# Patient Record
Sex: Male | Born: 1965 | Race: White | Hispanic: No | Marital: Married | Smoking: Former smoker
Health system: Southern US, Community
[De-identification: ages and names within clinical notes are randomized; demographics above are authoritative.]

## PROBLEM LIST (undated history)

## (undated) DIAGNOSIS — K219 Gastro-esophageal reflux disease without esophagitis: Secondary | ICD-10-CM

## (undated) DIAGNOSIS — G8929 Other chronic pain: Secondary | ICD-10-CM

## (undated) DIAGNOSIS — B9681 Helicobacter pylori [H. pylori] as the cause of diseases classified elsewhere: Secondary | ICD-10-CM

## (undated) DIAGNOSIS — M1990 Unspecified osteoarthritis, unspecified site: Secondary | ICD-10-CM

## (undated) DIAGNOSIS — K279 Peptic ulcer, site unspecified, unspecified as acute or chronic, without hemorrhage or perforation: Secondary | ICD-10-CM

## (undated) DIAGNOSIS — K259 Gastric ulcer, unspecified as acute or chronic, without hemorrhage or perforation: Secondary | ICD-10-CM

## (undated) DIAGNOSIS — M549 Dorsalgia, unspecified: Secondary | ICD-10-CM

## (undated) HISTORY — DX: Gastric ulcer, unspecified as acute or chronic, without hemorrhage or perforation: K25.9

## (undated) HISTORY — PX: HERNIA REPAIR: SHX51

## (undated) HISTORY — DX: Peptic ulcer, site unspecified, unspecified as acute or chronic, without hemorrhage or perforation: K27.9

## (undated) HISTORY — DX: Other chronic pain: G89.29

## (undated) HISTORY — DX: Gastro-esophageal reflux disease without esophagitis: K21.9

## (undated) HISTORY — DX: Dorsalgia, unspecified: M54.9

## (undated) HISTORY — DX: Helicobacter pylori (H. pylori) as the cause of diseases classified elsewhere: B96.81

---

## 2002-08-25 ENCOUNTER — Encounter: Payer: Self-pay | Admitting: Emergency Medicine

## 2002-08-25 ENCOUNTER — Emergency Department (HOSPITAL_COMMUNITY): Admission: EM | Admit: 2002-08-25 | Discharge: 2002-08-25 | Payer: Self-pay | Admitting: Emergency Medicine

## 2002-11-27 ENCOUNTER — Encounter: Admission: RE | Admit: 2002-11-27 | Discharge: 2002-11-27 | Payer: Self-pay | Admitting: Occupational Medicine

## 2002-11-27 ENCOUNTER — Encounter: Payer: Self-pay | Admitting: Occupational Medicine

## 2011-05-25 ENCOUNTER — Encounter: Payer: Self-pay | Admitting: Family Medicine

## 2011-05-25 ENCOUNTER — Ambulatory Visit (INDEPENDENT_AMBULATORY_CARE_PROVIDER_SITE_OTHER): Payer: BC Managed Care – PPO | Admitting: Family Medicine

## 2011-05-25 VITALS — BP 114/72 | HR 69 | Ht 66.5 in | Wt 161.0 lb

## 2011-05-25 DIAGNOSIS — K625 Hemorrhage of anus and rectum: Secondary | ICD-10-CM

## 2011-05-25 DIAGNOSIS — K529 Noninfective gastroenteritis and colitis, unspecified: Secondary | ICD-10-CM

## 2011-05-25 DIAGNOSIS — L98499 Non-pressure chronic ulcer of skin of other sites with unspecified severity: Secondary | ICD-10-CM

## 2011-05-25 DIAGNOSIS — M62830 Muscle spasm of back: Secondary | ICD-10-CM

## 2011-05-25 DIAGNOSIS — G8929 Other chronic pain: Secondary | ICD-10-CM

## 2011-05-25 DIAGNOSIS — M5380 Other specified dorsopathies, site unspecified: Secondary | ICD-10-CM

## 2011-05-25 DIAGNOSIS — IMO0002 Reserved for concepts with insufficient information to code with codable children: Secondary | ICD-10-CM

## 2011-05-25 DIAGNOSIS — K5289 Other specified noninfective gastroenteritis and colitis: Secondary | ICD-10-CM

## 2011-05-25 DIAGNOSIS — M25519 Pain in unspecified shoulder: Secondary | ICD-10-CM

## 2011-05-25 MED ORDER — AMOXICILL-CLARITHRO-LANSOPRAZ PO MISC: Freq: Two times a day (BID) | ORAL | Status: AC

## 2011-05-25 MED ORDER — ORPHENADRINE CITRATE ER 100 MG PO TB12: 100.0000 mg | ORAL_TABLET | Freq: Two times a day (BID) | ORAL | Status: DC

## 2011-05-25 NOTE — Progress Notes (Signed)
  Subjective:    Patient ID: Danny Cohen. [place], male    DOB: 1966/07/07, 45 y.o.   MRN: [id number]  HPI Patient is a 45 yr old WM w/abdominal pain. He states it has been going on for several weeks and he went to the ED 2 weeks ago. Was placed on prilosec which did not help much and last night after eating experienced such sharp pain he got OTC prilosec. He does state that due to some recent blow back pain that he experienced while working on a ladder and chronic R shoulder pain from an accident he has been using a lot of motrin. He does not smoke or drink ETOH significantly. He now has started seeing blood in his stools.   Review of Systems  HENT:       R shoulder and lower bilateral back pain  Gastrointestinal: Positive for abdominal distention.  Psychiatric/Behavioral: Negative.        Objective:   Physical Exam  Constitutional: He is oriented to person, place, and time. He appears well-developed and well-nourished.  HENT:  Head: Normocephalic.  Neck: Normal range of motion. Neck supple.  Cardiovascular: Normal rate, regular rhythm and normal heart sounds.   Pulmonary/Chest: Effort normal and breath sounds normal.  Abdominal: Soft. Bowel sounds are normal. He exhibits no mass. There is tenderness. There is no rebound and no guarding.       Tenderness is diffuse but most of the tenderness is mid upper abdomen.  Musculoskeletal:       Diffuse mid lower back pain consistent w/muscle spasm  Neurological: He is alert and oriented to person, place, and time.  Skin: Skin is warm and dry.  Psychiatric: He has a normal mood and affect. His behavior is normal.          Assessment & Plan:  Will treat w/prevpack or helidac for possible ulcer Will get h. pylori,cbc and cmp, and lipase Avoid motrin or alleve Norflex 100mg  twice a day for back pain if sedation occurs just take at night celebrex samples if available but do not take for 1-2 weeks  Return in 6 weeks for follow up and rectal  exam

## 2011-05-25 NOTE — Progress Notes (Signed)
Addended by: Hassan Rowan on: 05/25/2011 04:22 PM   Modules accepted: Orders

## 2011-05-25 NOTE — Patient Instructions (Signed)
Will treat w/prevpack or helidac for possible ulcer Will get h. pylori,cbc and cmp, and lipase Avoid motrin or alleve Norflex 100mg  twice a day for back pain if sedation occurs just take at night celebrex samples if available but do not take for 1-2 weeks

## 2011-05-26 LAB — CBC WITH DIFFERENTIAL/PLATELET
Basophils Absolute: 0.1 10*3/uL (ref 0.0–0.1)
Basophils Relative: 1 % (ref 0–1)
Eosinophils Absolute: 0.2 10*3/uL (ref 0.0–0.7)
Eosinophils Relative: 5 % (ref 0–5)
HCT: 42.1 % (ref 39.0–52.0)
Hemoglobin: 14.2 g/dL (ref 13.0–17.0)
Lymphocytes Relative: 28 % (ref 12–46)
Lymphs Abs: 1.4 10*3/uL (ref 0.7–4.0)
MCH: 30.1 pg (ref 26.0–34.0)
MCHC: 33.7 g/dL (ref 30.0–36.0)
MCV: 89.4 fL (ref 78.0–100.0)
Monocytes Absolute: 0.3 10*3/uL (ref 0.1–1.0)
Monocytes Relative: 7 % (ref 3–12)
Neutro Abs: 2.9 10*3/uL (ref 1.7–7.7)
Neutrophils Relative %: 59 % (ref 43–77)
Platelets: 278 10*3/uL (ref 150–400)
RBC: 4.71 MIL/uL (ref 4.22–5.81)
RDW: 13.6 % (ref 11.5–15.5)
WBC: 5 10*3/uL (ref 4.0–10.5)

## 2011-05-26 LAB — COMPLETE METABOLIC PANEL WITH GFR
ALT: 17 U/L (ref 0–53)
AST: 18 U/L (ref 0–37)
Albumin: 4.9 g/dL (ref 3.5–5.2)
Alkaline Phosphatase: 42 U/L (ref 39–117)
BUN: 12 mg/dL (ref 6–23)
CO2: 26 mEq/L (ref 19–32)
Calcium: 9.9 mg/dL (ref 8.4–10.5)
Chloride: 104 mEq/L (ref 96–112)
Creat: 1.08 mg/dL (ref 0.50–1.35)
GFR, Est African American: >60 mL/min (ref >60)
GFR, Est Non African American: >60 mL/min (ref >60)
Glucose, Bld: 88 mg/dL (ref 70–99)
Potassium: 5.1 mEq/L (ref 3.5–5.3)
Sodium: 142 mEq/L (ref 135–145)
Total Bilirubin: 0.5 mg/dL (ref 0.3–1.2)
Total Protein: 7.4 g/dL (ref 6.0–8.3)

## 2011-05-26 LAB — H. PYLORI ANTIBODY, IGG: H Pylori IgG: 6.06 {ISR} — ABNORMAL HIGH

## 2011-05-26 LAB — LIPASE: Lipase: 56 U/L (ref 0–75)

## 2011-06-05 ENCOUNTER — Telehealth: Payer: Self-pay | Admitting: Family Medicine

## 2011-06-05 ENCOUNTER — Other Ambulatory Visit: Payer: Self-pay | Admitting: Family Medicine

## 2011-06-05 NOTE — Telephone Encounter (Signed)
Message copied by Gifford Shave on Mon Jun 05, 2011  2:24 PM ------      Message from: Nani Gasser D      Created: Mon Jun 05, 2011 12:30 PM       He is + for h. Pylori which is a stomach bug that can make you more prone to ulcers. The prevpac that was sent to his pharm should clear this up. Pancreas is nl. No sign of systemic infection. CMP is nl.

## 2011-06-05 NOTE — Telephone Encounter (Signed)
I just reviewed adn sent a resulst note.

## 2011-06-05 NOTE — Telephone Encounter (Signed)
Pt calling for his recent lab results, and I do not see where provider has reviewed.  Don't see in anyone's result folder.  Please advise since abnormal. Plan:  Routed to Dr. Marlyne Beards, LPN [facility]

## 2011-06-05 NOTE — Telephone Encounter (Signed)
Pt notified about lab results.  Had to [facility] with details and told to pup Prevpac. Jarvis Newcomer, LPN [facility]'

## 2011-06-19 ENCOUNTER — Telehealth: Payer: Self-pay | Admitting: Family Medicine

## 2011-06-19 NOTE — Telephone Encounter (Signed)
Pt called and said he has one dose of his prevpac left and is still C/O bloating, diarrhea twice daily that is watery and some form, and with discomfort still.  Pt is straining to have a BM.  Pt has a 6 week fup appt for 07-06-11, but do we need to see sooner.  Please advise. Plan:  Routed to Dr. Estill Bakes, LPN [facility]

## 2011-06-20 ENCOUNTER — Encounter: Payer: Self-pay | Admitting: Family Medicine

## 2011-06-20 NOTE — Telephone Encounter (Signed)
It still may take a while for complete healing. I would now go from the Prevpac to prevacid 30 mg po daily and Carafate 2 tablets twice a day on empty stomach for at least 2 months. If he is not feeling a lot better in 2 weeks please let me see  Him before 2 months.

## 2011-06-20 NOTE — Telephone Encounter (Signed)
Darl Pikes I am not sure if you got my earlier quick note. Please have him go from the Prev Pack to Prevacid 30 mg po a day  And Carafate 1 gram 2 tablet twice a day on an empty stomach. He will need it for 2 months but if he is not better in 2 weeks please return for recheck.

## 2011-06-20 NOTE — Telephone Encounter (Signed)
LM with the HR dept at [facility] to have the pt call our office for further instructions.  All numbers in the system are incorrect and need to be updated.   Jarvis Newcomer, LPN [facility]

## 2011-06-21 NOTE — Telephone Encounter (Signed)
[facility] called back and said this pt is not an employee .  All numbers in system are incorrect.  Unfortunately, will have to wait for the pt to call the office back. Jarvis Newcomer, LPN [facility];'

## 2011-07-06 ENCOUNTER — Ambulatory Visit (INDEPENDENT_AMBULATORY_CARE_PROVIDER_SITE_OTHER): Payer: BC Managed Care – PPO | Admitting: Family Medicine

## 2011-07-06 ENCOUNTER — Encounter: Payer: Self-pay | Admitting: Family Medicine

## 2011-07-06 DIAGNOSIS — R1013 Epigastric pain: Secondary | ICD-10-CM

## 2011-07-06 DIAGNOSIS — G8929 Other chronic pain: Secondary | ICD-10-CM

## 2011-07-06 DIAGNOSIS — B9681 Helicobacter pylori [H. pylori] as the cause of diseases classified elsewhere: Secondary | ICD-10-CM

## 2011-07-06 DIAGNOSIS — A048 Other specified bacterial intestinal infections: Secondary | ICD-10-CM

## 2011-07-06 DIAGNOSIS — K219 Gastro-esophageal reflux disease without esophagitis: Secondary | ICD-10-CM

## 2011-07-06 DIAGNOSIS — K3189 Other diseases of stomach and duodenum: Secondary | ICD-10-CM

## 2011-07-06 DIAGNOSIS — M542 Cervicalgia: Secondary | ICD-10-CM

## 2011-07-06 DIAGNOSIS — K279 Peptic ulcer, site unspecified, unspecified as acute or chronic, without hemorrhage or perforation: Secondary | ICD-10-CM

## 2011-07-06 HISTORY — DX: Helicobacter pylori (H. pylori) as the cause of diseases classified elsewhere: B96.81

## 2011-07-06 MED ORDER — ESOMEPRAZOLE MAGNESIUM 40 MG PO CPDR: 40.0000 mg | DELAYED_RELEASE_CAPSULE | Freq: Every day | ORAL | Status: DC

## 2011-07-06 MED ORDER — CARAFATE 1 G PO TABS: 2.0000 g | ORAL_TABLET | Freq: Two times a day (BID) | ORAL | Status: DC

## 2011-07-06 MED ORDER — CELECOXIB 200 MG PO CAPS: 200.0000 mg | ORAL_CAPSULE | Freq: Every day | ORAL | Status: DC

## 2011-07-06 NOTE — Patient Instructions (Signed)
Place neck pain patient instructions here. Helicobacter Pylori and Ulcer Disease An ulcer may be in your stomach (gastric ulcer) or in the first part of your small bowel, which is called the duodenum (duodenal ulcer). An ulcer is a break in the stomach or duodenum lining. The break wears down into the deeper tissue. Helicobacter pylori (H. pylori) is a type of germ (bacteria) that may cause the majority of gastric or duodenal ulcers. CAUSES   A germ (bacterium). H. pylori can weaken the protective mucous coating of the stomach and duodenum. This allows acid to get through to the sensitive lining of the stomach or duodenum and an ulcer can then form.   Certain medications.   Using substances that can bother the lining of the stomach (alcohol, tobacco or medications such as Advil or Motrin) in the presence of H.pylori infection. This can increase the chances of getting an ulcer.   Cancer (rarely).  Most people infected with H. pylori do not get ulcers. It is not known how people catch H. pylori. It maCervical Sprain and Strain A cervical sprain is an injury to the neck. The injury can include either over-stretching or even small tears in the ligaments that hold the bones of the neck in place. A strain affects muscles and tendons. Minor injuries usually only involve ligaments and muscles. Because the different parts of the neck are so close together, more severe injuries can involve both sprain and strain. These injuries can affect the muscles, ligaments, tendons, discs, and nerves in the neck. CAUSES  An injury may be the result of a direct blow or from certain habits that can lead to the symptoms noted above.  Injury from:   Contact sports (such as football, rugby, wrestling, hockey, auto racing, gymnastics, diving, martial arts, and boxing).   Motor vehicle accidents.   Whiplash injuries (see image at right). These are common. They occur when the neck is forcefully whipped or forced backward  and/or forward.   Falls.   Lifestyle or awkward postures:   Cradling a telephone between the ear and shoulder.   Sitting in a chair that offers no support.   Working at an Theme park manager station.   Activities that require hours of repeated or long periods of looking up (stretching the neck backward) or looking down (bending the head/neck forward).  SYMPTOMS   Pain, soreness, stiffness, or burning sensation in the front, back, or sides of the neck. This may develop immediately after injury. Onset of discomfort may also develop slowly and not begin for 24 hours or more.   Shoulder and/or upper back pain.   Limits to the normal movement of the neck.   Headache.   Dizziness.   Weakness and/or abnormal sensation (such as numbness or tingling) of one or both arms and/or hands.   Muscle spasm.   Difficulty with swallowing or chewing.   Tenderness and swelling at the injury site.  DIAGNOSIS  Most of the time, your caregiver can diagnose this problem with a careful history and examination. The history will include information about known problems (such as arthritis in the neck) or a previous neck injury. X-rays may be ordered to find out if there is a different problem. X-rays can also help to find problems with the bones of the neck not related to the injury or current symptoms. TREATMENT  Several treatment options are available to help pain, spasm, and other symptoms. They include:  Cold helps relieve pain and reduce inflammation. Cold should be  applied for 10 to 15 minutes every 2 to 3 hours after any activity that aggravates your symptoms. Use ice packs or an ice massage. Place a towel or cloth in between your skin and the ice pack.   Medication:   Only take over-the-counter or prescription medicines for pain, discomfort, or fever as directed by your caregiver.   Pain relievers or muscle relaxants may be prescribed. Use only as directed and only as much as you need.    Change in the activity that caused the problem. This might include using a headset with a telephone so that the phone is not propped between your ear and shoulder.   Neck collar. Your caregiver may recommend temporary use of a soft cervical collar.   Work station. Changes may be needed in your work place. A better sitting position and/or better posture during work may be part of your treatment.   Physical Therapy. Your caregiver may recommend physical therapy. This can include instructions in the use of stretching and strengthening exercises. Improvement in posture is important. Exercises and posture training can help stabilize the neck and strengthen muscles and keep symptoms from returning.  HOME CARE INSTRUCTIONS  Other than formal physical therapy, all treatments above can be done at home. Even when not at work, it is important to be conscious of your posture and of activities that can cause a return of symptoms. Most cervical sprains and/or strains are better in 1-3 weeks. As you improve and increase activities, doing a warm up and stretching before the activity will help prevent recurrent problems. SEEK MEDICAL CARE IF:   Pain is not effectively controlled with medication.   You feel unable to decrease pain medication over time as planned.   Activity level is not improving as planned and/or expected.  SEEK IMMEDIATE MEDICAL CARE IF:   While using medication, you develop any bleeding, stomach upset, or signs of an allergic reaction.   Symptoms get worse, become intolerable, and are not helped by medications.   New, unexplained symptoms develop.   You experience numbness, tingling, weakness, or paralysis of any part of your body.  MAKE SURE YOU:   Understand these instructions.   Will watch your condition.   Will get help right away if you are not doing well or get worse.  Document Released: 06/25/2007 Document Revised: 05/10/2011 Document Reviewed: 06/25/2007 [facility]  Patient Information 2012 [place], [place].y be through food or water. H. pylori has been found in the saliva of some infected people. Therefore, the bacteria may also spread through mouth-to-mouth contact such as kissing. SYMPTOMS  The problems (symptoms) of ulcer disease are usually:  A burning or gnawing of the mid-upper belly (abdomen). This is often worse on an empty stomach. It may get better with food. This may be associated with feeling sick to your stomach (nausea), bloating and vomiting.   If the ulcer results in bleeding, it can cause:   Black, tarry stools.   Throwing up bright red blood.   Throwing up coffee ground looking materials.  With severe bleeding, there may be loss of consciousness and shock. Besides ulcer disease, H. pylori can also cause chronic gastritis (irritation of the lining of the stomach without ulcer) or stomach acid-type discomfort. You may not have symptoms even though you have an H. pylori infection. Although this is an infection, you may not have usual infection symptoms (such as fever). DIAGNOSIS  Ulcer disease can be diagnosed in many different ways. If you have an ulcer, it  is important to know whether or not it is caused by H. Pylori. Treatment for an ulcer caused by H. pylori is different from that for an ulcer with other causes. The best way to detect H. pylori is taking tissue directly from the ulcer during an endoscopy test.   An endoscopy is an exam that uses an endoscope. This is a thin, lighted tube with a small camera on the end. It is like a flexible telescope. The patient is given a drug to make them calm (sedative). The caregiver eases the endoscope into the mouth and down the throat to the stomach and duodenum. This allows the doctor to see the lining of the esophagus, stomach and duodenum.   If an endoscopy is not needed, then H. pylori can be detected with tests of the blood, stool or even breath.  TREATMENT   H. pylori peptic ulcer treatment  usually involves a combination of:   Medicines that kill germs (antibiotics).   Acid suppressors.   Stomach protectors.   The use of only one medication to treat H. pylori is not recommended. The most proven treatment is a 2 week course of treatment called triple therapy. It involves taking two antibiotics to kill the bacteria and either an acid suppressor or stomach-lining shield. Two-week triple therapy reduces ulcer symptoms, kills the bacteria, and prevents ulcers from coming back in many patients.   Unfortunately, patients may find triple therapy hard to do. This is because it involves taking as many as 20 pills a day. Also, the antibiotics used in triple therapy may cause mild side effects. These include nausea, vomiting, diarrhea, dark stools, a metallic taste in the mouth, dizziness, headache and yeast infections in women. Talk to your caregiver if you have any of these side effects.  HOME CARE INSTRUCTIONS   Take your medications as directed and for as long as prescribed. Contact your caregiver if you have problems or side effects from your medications.   Continue regular work and usual activities unless told otherwise by your caregiver.   Avoid tobacco, alcohol and caffeine. Tobacco use will decrease and slow healing.   Avoid medications that are harmful. This includes aspirin and NSAIDS such as ibuprofen and naproxen.   Avoid foods that seem to aggravate or cause discomfort.   There are many over-the-counter products available to control stomach acid and other symptoms. Discuss these with your caregiver before using them. Do not  stop taking prescription medications for over-the-counter medications without talking with your caregiver.   Special diets are not usually needed.   Keep any follow-up appointments and blood tests as directed.  SEEK MEDICAL CARE IF:   Your pain or other ulcer symptoms do not improve within a few days of starting treatment.   You develop diarrhea.  This can be a problem related to certain treatments.   You have ongoing indigestion or heartburn even if your main ulcer symptoms are improved.   You think you have any side effects from your medications or if you do not understand how to use your medications right.  SEEK IMMEDIATE MEDICAL CARE IF:  Any of the following happen:  You develop bright red, rectal bleeding.   You develop dark black, tarry stools.   You throw up (vomit) blood.   You become light-headed, weak, have fainting episodes, or become sweaty, cold and clammy.   You have severe abdominal pain not controlled by medications. Do not take pain medications unless ordered by your caregiver.  MAKE SURE  YOU:   Understand these instructions.   Will watch your condition.   Will get help right away if you are not doing well or get worse.  Document Released: 11/18/2003 Document Revised: 05/10/2011 Document Reviewed: 04/16/2008 [facility] Patient Information 2012 [place], [place].

## 2011-07-06 NOTE — Assessment & Plan Note (Signed)
Peptic ulcer disease will treat with Nexium one capsule every day and also Carafate 2 tablets twice a day 1 into stomach. New since stressed to the patient the importance of taking the Nexium for at least 2 months until he comes back.  The Carafate I am recommending he takes it for 2 weeks to 6-8 weeks to make sure that his stomach healing has completed. Also because of concern of taking Pepto-Bismol recommend that this all unless he has Carafate onboard and a course of Nexium onboard. We will schedule him come back in about 2 months for complete physical at that time lost from check his stool for blood for confirmation of the healing of the peptic ulcer disease.

## 2011-07-06 NOTE — Progress Notes (Signed)
  Subjective:    Patient ID: Danny Cohen. [place], male    DOB: [birth year], 45 y.o.   MRN: [id number]  Abdominal Pain His past medical history is significant for GERD.  Gastrophageal Reflux He complains of abdominal pain.  Neck Pain   Shoulder Pain    Patient was diagnosed with H. pylori recently. He was placed on a Prevpac which he took for 2 weeks. Unfortunately didn't realize this was take his PPI again wishes to with the Prevpac. He reports that within 2 weeks' time he felt much better but then the last few weeks he's had a recurrence of the burning at night when he goes to sleep. The pharmacist called to draftsman he wants to go back on his Nexium he didn't think that was needed as he did not take it. He is also concerned about his neck and shoulder he was told by his chiropractor he had bone-on-bone he stopped going to see the chiropractor system nodulations did not seem to help any. He does report that when he took the Celebrex samples it did make it better and he wants to know what we should do next and if he needs to sees a specialist.    Review of Systems  HENT: Positive for neck pain and neck stiffness.   Gastrointestinal: Positive for abdominal pain.  All other systems reviewed and are negative.       Objective:   Physical Exam  Constitutional: He is oriented to person, place, and time. He appears well-developed and well-nourished.  HENT:  Head: Normocephalic.  Neck: Normal range of motion. Neck supple.       Tenderness along the C-spine.  Abdominal: Soft. Bowel sounds are normal. He exhibits no distension. There is no tenderness. There is no rebound.  Neurological: He is alert and oriented to person, place, and time.  Skin: Skin is warm and dry.  Psychiatric: He has a normal mood and affect.          Assessment & Plan:

## 2011-07-06 NOTE — Assessment & Plan Note (Signed)
Will plan for the neck pain to continue using Celebrex 200 mg one capsule a day. Will allow him a chance to use his Nexium and Carafate to the stomach feel better before starting the Celebrex and didn't recommend not try to take it everyday but when ever he has pain. We discussed this is probably the safest way to treat that neck pain and the chronicity of it.  Another option of course is to refer to orthopedic and possible TL for MRI surgery was needed but because I easily than [facility] chiropractic Mrs. Danny Cohen is doubtful that a neurosurgeon or MRI will be even helpful or adventagous at this time.

## 2011-07-31 ENCOUNTER — Telehealth: Payer: Self-pay | Admitting: *Deleted

## 2011-07-31 NOTE — Telephone Encounter (Signed)
Pt called and would like to have a referral put in for a neck and shoulder specialist

## 2011-08-01 NOTE — Telephone Encounter (Signed)
If he would to see an Orthopedic fine I think it is muscular skeletal and if the ortho does not work we may need a Chiropodist like

## 2011-08-31 ENCOUNTER — Encounter: Payer: Self-pay | Admitting: Family Medicine

## 2011-08-31 ENCOUNTER — Ambulatory Visit (INDEPENDENT_AMBULATORY_CARE_PROVIDER_SITE_OTHER): Payer: BC Managed Care – PPO | Admitting: Family Medicine

## 2011-08-31 VITALS — BP 124/78 | HR 67 | Ht 67.0 in | Wt 157.0 lb

## 2011-08-31 DIAGNOSIS — M549 Dorsalgia, unspecified: Secondary | ICD-10-CM

## 2011-08-31 DIAGNOSIS — Z23 Encounter for immunization: Secondary | ICD-10-CM

## 2011-08-31 DIAGNOSIS — K219 Gastro-esophageal reflux disease without esophagitis: Secondary | ICD-10-CM

## 2011-08-31 DIAGNOSIS — Z283 Underimmunization status: Secondary | ICD-10-CM

## 2011-08-31 DIAGNOSIS — K259 Gastric ulcer, unspecified as acute or chronic, without hemorrhage or perforation: Secondary | ICD-10-CM

## 2011-08-31 DIAGNOSIS — A048 Other specified bacterial intestinal infections: Secondary | ICD-10-CM

## 2011-08-31 DIAGNOSIS — G8929 Other chronic pain: Secondary | ICD-10-CM

## 2011-08-31 DIAGNOSIS — Z2839 Other underimmunization status: Secondary | ICD-10-CM

## 2011-08-31 DIAGNOSIS — B9681 Helicobacter pylori [H. pylori] as the cause of diseases classified elsewhere: Secondary | ICD-10-CM

## 2011-08-31 MED ORDER — TETANUS-DIPHTH-ACELL PERTUSSIS 5-2.5-18.5 LF-MCG/0.5 IM SUSP: 0.5000 mL | Freq: Once | INTRAMUSCULAR | Status: DC

## 2011-08-31 NOTE — Progress Notes (Signed)
  Subjective:    Patient ID: Danny Cohen. [place], male    DOB: 1965-09-28, 45 y.o.   MRN: [id number]  Gastrophageal Reflux He reports no abdominal pain, no dysphagia or no heartburn. 3 days ago he had some burning. This is a recurrent problem. The problem occurs frequently. The problem has been rapidly improving. The symptoms are aggravated by certain foods. Risk factors include no known risk factors. He has tried a PPI for the symptoms. The treatment provided significant relief. Past procedures include H. pylori antibody titer.   Problem #2 neck and back pain. Still having some neck and back pain he's not take the Celebrex I prescribed he was afraid it might interfere with his stomach medication. He is willing to see his chiropractor in [place]. #3 immunization need she needs flu vaccine and tetanus.   Review of Systems  Gastrointestinal: Negative for heartburn, dysphagia and abdominal pain.  Musculoskeletal: Positive for myalgias and back pain.  All other systems reviewed and are negative.     BP 124/78  Pulse 67  Ht 5\' 7"  (1.702 m)  Wt 157 lb (71.215 kg)  BMI 24.59 kg/m2  SpO2 97%  Objective:   Physical Exam  Constitutional: He is oriented to person, place, and time. He appears well-developed and well-nourished.  Musculoskeletal: Normal range of motion.  Neurological: He is alert and oriented to person, place, and time.  Skin: Skin is warm and dry.  Psychiatric: He has a normal mood and affect. His behavior is normal.          Assessment & Plan:  #1 H pyloric&  gastroesophageal reflux Continues Carafate 2 tablets twice a day until February 1 of February 1 he may reduce it to one tablet twice a day as of March 1 he may  Discontinue the  Carafate I do want to maintain the Nexium until he returns. #2 he will follow up chiropractor if the chiropractor's not successful in controlling his back pain and neck pain we'll make a referral to orthopedic of his choice #3 health maintenance  patient refused flu vaccination. He did agree to proceed with tetanus and will administer that today . Patient's return in roughly 4-6 months for complete physical. Also discussed wiith patient about supplementation since his sister has access to the nitrite multivitamin recommend he check with her about getting started on the vitamin called  Men's Pack.

## 2011-08-31 NOTE — Patient Instructions (Signed)

## 2011-09-03 DIAGNOSIS — K219 Gastro-esophageal reflux disease without esophagitis: Secondary | ICD-10-CM | POA: Insufficient documentation

## 2011-09-03 DIAGNOSIS — K259 Gastric ulcer, unspecified as acute or chronic, without hemorrhage or perforation: Secondary | ICD-10-CM

## 2011-09-03 DIAGNOSIS — G8929 Other chronic pain: Secondary | ICD-10-CM | POA: Insufficient documentation

## 2011-09-03 DIAGNOSIS — M549 Dorsalgia, unspecified: Secondary | ICD-10-CM | POA: Insufficient documentation

## 2011-09-03 DIAGNOSIS — B9681 Helicobacter pylori [H. pylori] as the cause of diseases classified elsewhere: Secondary | ICD-10-CM | POA: Insufficient documentation

## 2011-09-03 HISTORY — DX: Other chronic pain: G89.29

## 2011-09-03 HISTORY — DX: Gastro-esophageal reflux disease without esophagitis: K21.9

## 2011-09-03 HISTORY — DX: Helicobacter pylori (H. pylori) as the cause of diseases classified elsewhere: B96.81

## 2011-09-03 HISTORY — DX: Gastric ulcer, unspecified as acute or chronic, without hemorrhage or perforation: K25.9

## 2011-10-26 ENCOUNTER — Ambulatory Visit (INDEPENDENT_AMBULATORY_CARE_PROVIDER_SITE_OTHER): Payer: BC Managed Care – PPO | Admitting: Family Medicine

## 2011-10-26 ENCOUNTER — Encounter: Payer: Self-pay | Admitting: Family Medicine

## 2011-10-26 DIAGNOSIS — R930 Abnormal findings on diagnostic imaging of skull and head, not elsewhere classified: Secondary | ICD-10-CM

## 2011-10-26 DIAGNOSIS — J0100 Acute maxillary sinusitis, unspecified: Secondary | ICD-10-CM

## 2011-10-26 NOTE — Progress Notes (Signed)
  Subjective:    Patient ID: Danny Cohen. [place], male    DOB: 1966/01/28, 46 y.o.   MRN: [id number]  HPI  Hx of sinus problems. With increase tooth pain nasal congestion at time and epistaxis. He felt that it was just his allergies but dentist felt it was secondarily to obstructed maxillary sinuses bilateral. Review of Systems    as before w/neck and shoulder pain   BP 124/74  Pulse 74  Temp(Src) 97.6 F (36.4 C) (Oral)  Wt 158 lb (71.668 kg)  SpO2 98% Objective:   Physical Exam  Constitutional: He is oriented to person, place, and time. He appears well-developed and well-nourished.  HENT:  Head: Normocephalic.  Right Ear: Hearing and tympanic membrane normal.  Left Ear: Hearing and tympanic membrane normal.  Nose: No mucosal edema or rhinorrhea. Right sinus exhibits maxillary sinus tenderness. Left sinus exhibits maxillary sinus tenderness.  Mouth/Throat: He does not have dentures. Normal dentition. No lacerations or dental caries.  Neck: Normal range of motion. Neck supple.  Neurological: He is alert and oriented to person, place, and time.  Skin: Skin is warm and dry.    .      Assessment & Plan:  Abnormal sinus films maxillary sinus DX Referral  To ENT for further ealuation

## 2011-10-26 NOTE — Patient Instructions (Signed)

## 2011-10-29 ENCOUNTER — Encounter: Payer: Self-pay | Admitting: Family Medicine

## 2012-02-09 ENCOUNTER — Other Ambulatory Visit: Payer: Self-pay | Admitting: Family Medicine

## 2012-02-09 ENCOUNTER — Other Ambulatory Visit: Payer: Self-pay | Admitting: *Deleted

## 2012-02-09 DIAGNOSIS — B9681 Helicobacter pylori [H. pylori] as the cause of diseases classified elsewhere: Secondary | ICD-10-CM

## 2012-02-09 DIAGNOSIS — K219 Gastro-esophageal reflux disease without esophagitis: Secondary | ICD-10-CM

## 2012-02-09 DIAGNOSIS — R1013 Epigastric pain: Secondary | ICD-10-CM

## 2012-02-09 MED ORDER — ESOMEPRAZOLE MAGNESIUM 40 MG PO CPDR: 40.0000 mg | DELAYED_RELEASE_CAPSULE | Freq: Every day | ORAL | Status: DC

## 2012-02-22 ENCOUNTER — Encounter: Payer: Self-pay | Admitting: Family Medicine

## 2012-02-22 ENCOUNTER — Ambulatory Visit (INDEPENDENT_AMBULATORY_CARE_PROVIDER_SITE_OTHER): Payer: BC Managed Care – PPO | Admitting: Family Medicine

## 2012-02-22 VITALS — BP 127/75 | HR 75 | Wt 158.0 lb

## 2012-02-22 DIAGNOSIS — M949 Disorder of cartilage, unspecified: Secondary | ICD-10-CM

## 2012-02-22 DIAGNOSIS — IMO0001 Reserved for inherently not codable concepts without codable children: Secondary | ICD-10-CM

## 2012-02-22 DIAGNOSIS — M898X1 Other specified disorders of bone, shoulder: Secondary | ICD-10-CM

## 2012-02-22 DIAGNOSIS — J342 Deviated nasal septum: Secondary | ICD-10-CM

## 2012-02-22 DIAGNOSIS — K2100 Gastro-esophageal reflux disease with esophagitis, without bleeding: Secondary | ICD-10-CM

## 2012-02-22 DIAGNOSIS — M899 Disorder of bone, unspecified: Secondary | ICD-10-CM

## 2012-02-22 DIAGNOSIS — R1031 Right lower quadrant pain: Secondary | ICD-10-CM

## 2012-02-22 NOTE — Progress Notes (Signed)
Subjective:    Patient ID: Danny Cohen, male    DOB: Aug 11, 1966, 46 y.o.   MRN: [id number]  HPI #1 Stomach discomfort is much better. He is taking both the Carafate and Nexium. He states that he sometimes forgets his medication and that is when he'll have GI discomfort. #2 sinus problems/deviated nasal septum. He was evaluated by the ENT who found evidence of him having sinus disease as thought by me and the dentist by his presentation when he had his a deviated septum which is causing according to him by the ENT recurrent bleeding from improper movement of air through the nasal passages. Interesting he was scheduled to have surgery sometime next month but because that would take up significant vacation days for recovery and looks like he will postpone it until spring of 2014. #3 he reportshe has noticed urgency, he has had some testicular pain on the R and in he inguinal area and is tender to palpation.He reports noticing this area doing a self examination. He has a history of seeing a urologist in the past due to with sounds like a varicocele along the left testicle but states it doesn't cause him any significant discomfort and it was different than what she found the other day.   #4 Neck and shoulder has gotten worse as well.MRI of the shoulder 4-5 years ago but is not sure which one was negative but he is subsequently had increase discomfort of the neck and shoulder. Because I had suggested a chiropractic visit which he has not followed through because he states when he did see the chiropractor in the past and has neck and shoulder adjusted it did not help.  Review of Systems  Genitourinary: Positive for frequency.  Musculoskeletal: Positive for myalgias and back pain.      BP 127/75  Pulse 75  Wt 158 lb (71.668 kg) Objective:   Physical Exam  Vitals reviewed. Constitutional: He is oriented to person, place, and time. He appears well-developed and well-nourished.  HENT:  Head:  Normocephalic.       Deviated nasal septum quite prominent.  Abdominal: He exhibits no distension. There is no tenderness. A hernia is present. Hernia confirmed positive in the right inguinal area. Hernia confirmed negative in the left inguinal area.  Genitourinary: Testes normal and penis normal. Right testis shows no mass and no tenderness. Left testis shows no mass and no tenderness. Circumcised.       Very small if present inguinal hernia on the right but there is tenderness over the area. On left there appears to be a small rectocele does not causing any tenderness at all at this time.   Musculoskeletal: Normal range of motion. He exhibits tenderness. He exhibits no edema.       In rotating the left shoulder there is some cracking heard in moving of the scapula indicating possible to have chronic bursitis or arthritis shoulder.  Neurological: He is alert and oriented to person, place, and time.  Skin: Skin is warm.  Psychiatric: He has a normal mood and affect. His behavior is normal.      Assessment & Plan:  #1 reflux esophagitis. Continue with Carafate and Nexium.  #2 deviated nasal septum allow him to followup with ENT for surgery when he is ready #3 possible small right inguinal hernia,  l varicocele,urgency. I'm not sure if this time if any of the 3 are related other than being in a similar area. Since his main concern is this pain on  the right side and am not sure if this is been caused by this possible small hernia we will make a surgical referral to see. #4 since there is some arthritic changes in the left shoulder will make a orthopedic referral to see if MRI is needed or other treatment. Return in 6 months for followup.

## 2012-02-22 NOTE — Patient Instructions (Signed)
Inguinal Hernia, Adult  Muscles help keep everything in the body in its proper place. But if a weak spot in the muscles develops, something can poke through. That is called a hernia. When this happens in the lower part of the belly (abdomen), it is called an inguinal hernia. (It takes its name from a part of the body in this region called the inguinal canal.) A weak spot in the wall of muscles lets some fat or part of the small intestine bulge through. An inguinal hernia can develop at any age. Men get them more often than women.  CAUSES   In adults, an inguinal hernia develops over time.  · It can be triggered by:  · Suddenly straining the muscles of the lower abdomen.  · Lifting heavy objects.  · Straining to have a bowel movement. Difficult bowel movements (constipation) can lead to this.  · Constant coughing. This may be caused by smoking or lung disease.  · Being overweight.  · Being pregnant.  · Working at a job that requires long periods of standing or heavy lifting.  · Having had an inguinal hernia before.  One type can be an emergency situation. It is called a strangulated inguinal hernia. It develops if part of the small intestine slips through the weak spot and cannot get back into the abdomen. The blood supply can be cut off. If that happens, part of the intestine may die. This situation requires emergency surgery.  SYMPTOMS   Often, a small inguinal hernia has no symptoms. It is found when a healthcare provider does a physical exam. Larger hernias usually have symptoms.   · In adults, symptoms may include:  · A lump in the groin. This is easier to see when the person is standing. It might disappear when lying down.  · In men, a lump in the scrotum.  · Pain or burning in the groin. This occurs especially when lifting, straining or coughing.  · A dull ache or feeling of pressure in the groin.  · Signs of a strangulated hernia can include:  · A bulge in the groin that becomes very painful and tender to the  touch.  · A bulge that turns red or purple.  · Fever, nausea and vomiting.  · Inability to have a bowel movement or to pass gas.  DIAGNOSIS   To decide if you have an inguinal hernia, a healthcare provider will probably do a physical examination.  · This will include asking questions about any symptoms you have noticed.  · The healthcare provider might feel the groin area and ask you to cough. If an inguinal hernia is felt, the healthcare provider may try to slide it back into the abdomen.  · Usually no other tests are needed.  TREATMENT   Treatments can vary. The size of the hernia makes a difference. Options include:  · Watchful waiting. This is often suggested if the hernia is small and you have had no symptoms.  · No medical procedure will be done unless symptoms develop.  · You will need to watch closely for symptoms. If any occur, contact your healthcare provider right away.  · Surgery. This is used if the hernia is larger or you have symptoms.  · Open surgery. This is usually an outpatient procedure (you will not stay overnight in a hospital). An cut (incision) is made through the skin in the groin. The hernia is put back inside the abdomen. The weak area in the muscles is   then repaired by herniorrhaphy or hernioplasty. Herniorrhaphy: in this type of surgery, the weak muscles are sewn back together. Hernioplasty: a patch or mesh is used to close the weak area in the abdominal wall.  · Laparoscopy. In this procedure, a surgeon makes small incisions. A thin tube with a tiny video camera (called a laparoscope) is put into the abdomen. The surgeon repairs the hernia with mesh by looking with the video camera and using two long instruments.  HOME CARE INSTRUCTIONS   · After surgery to repair an inguinal hernia:  · You will need to take pain medicine prescribed by your healthcare provider. Follow all directions carefully.  · You will need to take care of the wound from the incision.  · Your activity will be  restricted for awhile. This will probably include no heavy lifting for several weeks. You also should not do anything too active for a few weeks. When you can return to work will depend on the type of job that you have.  · During "watchful waiting" periods, you should:  · Maintain a healthy weight.  · Eat a diet high in fiber (fruits, vegetables and whole grains).  · Drink plenty of fluids to avoid constipation. This means drinking enough water and other liquids to keep your urine clear or pale yellow.  · Do not lift heavy objects.  · Do not stand for long periods of time.  · Quit smoking. This should keep you from developing a frequent cough.  SEEK MEDICAL CARE IF:   · A bulge develops in your groin area.  · You feel pain, a burning sensation or pressure in the groin. This might be worse if you are lifting or straining.  · You develop a fever of more than 100.5° F (38.1° C).  SEEK IMMEDIATE MEDICAL CARE IF:   · Pain in the groin increases suddenly.  · A bulge in the groin gets bigger suddenly and does not go down.  · For men, there is sudden pain in the scrotum. Or, the size of the scrotum increases.  · A bulge in the groin area becomes red or purple and is painful to touch.  · You have nausea or vomiting that does not go away.  · You feel your heart beating much faster than normal.  · You cannot have a bowel movement or pass gas.  · You develop a fever of more than 102.0° F (38.9° C).  Document Released: 01/14/2009 Document Revised: 08/17/2011 Document Reviewed: 01/14/2009  [employer]® Patient Information ©2012 [employer].

## 2012-03-25 ENCOUNTER — Encounter (INDEPENDENT_AMBULATORY_CARE_PROVIDER_SITE_OTHER): Payer: Self-pay | Admitting: Surgery

## 2012-03-25 ENCOUNTER — Ambulatory Visit (INDEPENDENT_AMBULATORY_CARE_PROVIDER_SITE_OTHER): Payer: BC Managed Care – PPO | Admitting: Surgery

## 2012-03-25 VITALS — BP 118/64 | HR 60 | Temp 97.4°F | Resp 16 | Ht 67.0 in | Wt 155.0 lb

## 2012-03-25 DIAGNOSIS — I861 Scrotal varices: Secondary | ICD-10-CM

## 2012-03-25 DIAGNOSIS — K4020 Bilateral inguinal hernia, without obstruction or gangrene, not specified as recurrent: Secondary | ICD-10-CM

## 2012-03-25 NOTE — Progress Notes (Signed)
Subjective:     Patient ID: Danny Cohen. [place]illing[place], male   DOB: 01-18-1966, 46 y.o.   MRN: [id number]  HPI  Danny Cohen  Sep 27, 1965 [id number]  Patient Care Team: Hassan Rowan, MD as PCP - General (Family Medicine)  This patient is a 46 y.o.male who presents today for surgical evaluation at the request of Dr Thurmond Butts.   Reason for evaluation: Right groin pain.  Probable hernia.  History of left varicocele  Pleasant active male who has had some issues with left groin pain for several years.  He saw urologist and was told this is probably a varicocele.  They did not feel that surgery was warranted.  More recently, he's noted some pain in his right groin.  H thinks he feels a lump.  He saw his primary care physician thought he had an inguinal hernia.  Therefore the patient was sent to [place].  He urinates without any difficulties & no constipation.  Has a moderately active job.  No history of prior abdominal or groin surgeries.  No history of prior hernia  Patient Active Problem List  Diagnosis  . Peptic ulcer due to Helicobacter pylori  . Neck pain, chronic  . Esophageal reflux disease  . Gastric ulcer due to Helicobacter pylori  . Chronic back pain greater than 3 months duration  . Bilateral inguinal hernia (BIH)  . Left varicocele    Past Medical History  Diagnosis Date  . Esophageal reflux disease 09/03/2011  . Chronic back pain greater than 3 months duration 09/03/2011  . Gastric ulcer due to Helicobacter pylori 09/03/2011  . Peptic ulcer due to Helicobacter pylori 07/06/2011    No past surgical history on file.  History   Social History  . Marital Status: Married    Spouse Name: N/A    Number of Children: N/A  . Years of Education: N/A   Occupational History  . Not on file.   Social History Main Topics  . Smoking status: Former Smoker    Quit date: 09/11/2000  . Smokeless tobacco: Not on file  . Alcohol Use: No  . Drug Use: No  . Sexually Active: Not on file   Other Topics  Concern  . Not on file   Social History Narrative  . No narrative on file    No family history on file.  Current Outpatient Prescriptions  Medication Sig Dispense Refill  . CARAFATE 1 G tablet Take 2 tablets (2 g total) by mouth 2 (two) times daily before a meal.  120 tablet  6  . celecoxib (CELEBREX) 200 MG capsule Take 200 mg by mouth daily.      Marland Kitchen esomeprazole (NEXIUM) 40 MG capsule Take 1 capsule (40 mg total) by mouth daily.  30 capsule  6  . ibuprofen (ADVIL,MOTRIN) 200 MG tablet Take 200 mg by mouth as needed.      Marland Kitchen DISCONTD: celecoxib (CELEBREX) 200 MG capsule Take 1 capsule (200 mg total) by mouth daily.  30 capsule  6     No Known Allergies  BP 118/64  Pulse 60  Temp 97.4 F (36.3 C) (Temporal)  Resp 16  Ht 5\' 7"  (1.702 m)  Wt 155 lb (70.308 kg)  BMI 24.28 kg/m2  No results found.   Review of Systems  Constitutional: Negative for fever, chills and diaphoresis.  HENT: Negative for nosebleeds, sore throat, facial swelling, mouth sores, trouble swallowing and ear discharge.   Eyes: Negative for photophobia, discharge and visual disturbance.  Respiratory: Negative for  choking, chest tightness, shortness of breath and stridor.   Cardiovascular: Negative for chest pain and palpitations.       Patient walks 90 minutes for about 3 miles without difficulty.  No exertional chest/neck/shoulder/arm pain.   Gastrointestinal: Negative for nausea, vomiting, abdominal pain, diarrhea, constipation, blood in stool, abdominal distention, anal bleeding and rectal pain.       BM daily  Genitourinary: Negative for dysuria, urgency, difficulty urinating and testicular pain.  Musculoskeletal: Negative for myalgias, back pain, arthralgias and gait problem.  Skin: Negative for color change, pallor, rash and wound.  Neurological: Negative for dizziness, speech difficulty, weakness, numbness and headaches.  Hematological: Negative for adenopathy. Does not bruise/bleed easily.    Psychiatric/Behavioral: Negative for hallucinations, confusion and agitation.       Objective:   Physical Exam  Constitutional: He is oriented to person, place, and time. He appears well-developed and well-nourished. No distress.  HENT:  Head: Normocephalic.  Mouth/Throat: Oropharynx is clear and moist. No oropharyngeal exudate.  Eyes: Conjunctivae and EOM are normal. Pupils are equal, round, and reactive to light. No scleral icterus.  Neck: Normal range of motion. Neck supple. No tracheal deviation present.  Cardiovascular: Normal rate, regular rhythm and intact distal pulses.   Pulmonary/Chest: Effort normal and breath sounds normal. No respiratory distress.  Abdominal: Soft. He exhibits no distension. There is no tenderness. Hernia confirmed negative in the right inguinal area and confirmed negative in the left inguinal area.  Genitourinary:     Musculoskeletal: Normal range of motion. He exhibits no tenderness.  Lymphadenopathy:    He has no cervical adenopathy.       Right: No inguinal adenopathy present.       Left: No inguinal adenopathy present.  Neurological: He is alert and oriented to person, place, and time. No cranial nerve deficit. He exhibits normal muscle tone. Coordination normal.  Skin: Skin is warm and dry. No rash noted. He is not diaphoretic. No erythema. No pallor.  Psychiatric: He has a normal mood and affect. His behavior is normal. Judgment and thought content normal.       Assessment:     BIH, L eft sided varicocele    Plan:     Lap BIH repair:  The anatomy & physiology of the abdominal wall and pelvic floor was discussed.  The pathophysiology of hernias in the inguinal and pelvic region was discussed.  Natural history risks such as progressive enlargement, pain, incarceration & strangulation was discussed.   Contributors to complications such as smoking, obesity, diabetes, prior surgery, etc were discussed.    I feel the risks of no intervention  will lead to serious problems that outweigh the operative risks; therefore, I recommended surgery to reduce and repair the hernia.  I explained laparoscopic techniques with possible need for an open approach.  I noted usual use of mesh to patch and/or buttress hernia repair  Risks such as bleeding, infection, abscess, need for further treatment, heart attack, death, and other risks were discussed.  I noted a good likelihood this will help address the problem.   Goals of post-operative recovery were discussed as well.  Possibility that this will not correct all symptoms was explained.  I stressed the importance of low-impact activity, aggressive pain control, avoiding constipation, & not pushing through pain to minimize risk of post-operative chronic pain or injury. Possibility of reherniation was discussed.  We will work to minimize complications.     An educational handout further explaining the pathology & treatment  options was given as well.  Questions were answered.  The patient expresses understanding & wishes to proceed with surgery.  I noted if he wishes to have the varicocele addressed at the same time, we can have him see [facility] to do a combined case.  I do not have privileges at [facility] to do with his former urologist.  He was okay with proceeding with surgery for the hernias first.  With her children typically tender compared to sensitivity seem to be more in the groin inguinal canal.  I wonder if he status of the left hernia that is the source of his pain there as well.  At the end of the visit, he wished to think about things and call us.

## 2012-03-25 NOTE — Patient Instructions (Signed)
Inguinal Hernia, Adult Muscles help keep everything in the body in its proper place. But if a weak spot in the muscles develops, something can poke through. That is called a hernia. When this happens in the lower part of the belly (abdomen), it is called an inguinal hernia. (It takes its name from a part of the body in this region called the inguinal canal.) A weak spot in the wall of muscles lets some fat or part of the small intestine bulge through. An inguinal hernia can develop at any age. Men get them more often than women. CAUSES  In adults, an inguinal hernia develops over time.  It can be triggered by:   Suddenly straining the muscles of the lower abdomen.   Lifting heavy objects.   Straining to have a bowel movement. Difficult bowel movements (constipation) can lead to this.   Constant coughing. This may be caused by smoking or lung disease.   Being overweight.   Being pregnant.   Working at a job that requires long periods of standing or heavy lifting.   Having had an inguinal hernia before.  One type can be an emergency situation. It is called a strangulated inguinal hernia. It develops if part of the small intestine slips through the weak spot and cannot get back into the abdomen. The blood supply can be cut off. If that happens, part of the intestine may die. This situation requires emergency surgery. SYMPTOMS  Often, a small inguinal hernia has no symptoms. It is found when a healthcare provider does a physical exam. Larger hernias usually have symptoms.   In adults, symptoms may include:   A lump in the groin. This is easier to see when the person is standing. It might disappear when lying down.   In men, a lump in the scrotum.   Pain or burning in the groin. This occurs especially when lifting, straining or coughing.   A dull ache or feeling of pressure in the groin.   Signs of a strangulated hernia can include:   A bulge in the groin that becomes very painful  and tender to the touch.   A bulge that turns red or purple.   Fever, nausea and vomiting.   Inability to have a bowel movement or to pass gas.  DIAGNOSIS  To decide if you have an inguinal hernia, a healthcare provider will probably do a physical examination.  This will include asking questions about any symptoms you have noticed.   The healthcare provider might feel the groin area and ask you to cough. If an inguinal hernia is felt, the healthcare provider may try to slide it back into the abdomen.   Usually no other tests are needed.  TREATMENT  Treatments can vary. The size of the hernia makes a difference. Options include:  Watchful waiting. This is often suggested if the hernia is small and you have had no symptoms.   No medical procedure will be done unless symptoms develop.   You will need to watch closely for symptoms. If any occur, contact your healthcare provider right away.   Surgery. This is used if the hernia is larger or you have symptoms.   Open surgery. This is usually an outpatient procedure (you will not stay overnight in a hospital). An cut (incision) is made through the skin in the groin. The hernia is put back inside the abdomen. The weak area in the muscles is then repaired by herniorrhaphy or hernioplasty. Herniorrhaphy: in this type of   surgery, the weak muscles are sewn back together. Hernioplasty: a patch or mesh is used to close the weak area in the abdominal wall.   Laparoscopy. In this procedure, a surgeon makes small incisions. A thin tube with a tiny video camera (called a laparoscope) is put into the abdomen. The surgeon repairs the hernia with mesh by looking with the video camera and using two long instruments.  HOME CARE INSTRUCTIONS   After surgery to repair an inguinal hernia:   You will need to take pain medicine prescribed by your healthcare provider. Follow all directions carefully.   You will need to take care of the wound from the incision.    Your activity will be restricted for awhile. This will probably include no heavy lifting for several weeks. You also should not do anything too active for a few weeks. When you can return to work will depend on the type of job that you have.   During "watchful waiting" periods, you should:   Maintain a healthy weight.   Eat a diet high in fiber (fruits, vegetables and whole grains).   Drink plenty of fluids to avoid constipation. This means drinking enough water and other liquids to keep your urine clear or pale yellow.   Do not lift heavy objects.   Do not stand for long periods of time.   Quit smoking. This should keep you from developing a frequent cough.  SEEK MEDICAL CARE IF:   A bulge develops in your groin area.   You feel pain, a burning sensation or pressure in the groin. This might be worse if you are lifting or straining.   You develop a fever of more than 100.5 F (38.1 C).  SEEK IMMEDIATE MEDICAL CARE IF:   Pain in the groin increases suddenly.   A bulge in the groin gets bigger suddenly and does not go down.   For men, there is sudden pain in the scrotum. Or, the size of the scrotum increases.   A bulge in the groin area becomes red or purple and is painful to touch.   You have nausea or vomiting that does not go away.   You feel your heart beating much faster than normal.   You cannot have a bowel movement or pass gas.   You develop a fever of more than 102.0 F (38.9 C).  Document Released: 01/14/2009 Document Revised: 08/17/2011 Document Reviewed: 01/14/2009 [facility] Patient Information 2012 [place], [place].  LAPAROSCOPIC SURGERY: POST OP INSTRUCTIONS  1. DIET: Follow a light bland diet the first 24 hours after arrival home, such as soup, liquids, crackers, etc.  Be sure to include lots of fluids daily.  Avoid fast food or heavy meals as your are more likely to get nauseated.  Eat a low fat the next few days after surgery.   2. Take your usually  prescribed home medications unless otherwise directed. 3. PAIN CONTROL: a. Pain is best controlled by a usual combination of three different methods TOGETHER: i. Ice/Heat ii. Over the counter pain medication iii. Prescription pain medication b. Most patients will experience some swelling and bruising around the incisions.  Ice packs or heating pads (30-60 minutes up to 6 times a day) will help. Use ice for the first few days to help decrease swelling and bruising, then switch to heat to help relax tight/sore spots and speed recovery.  Some people prefer to use ice alone, heat alone, alternating between ice & heat.  Experiment to what works for you.  Swelling and bruising can take several weeks to resolve.   c. It is helpful to take an over-the-counter pain medication regularly for the first few weeks.  Choose one of the following that works best for you: i. Naproxen (Aleve, etc)  Two 220mg  tabs twice a day ii. Ibuprofen (Advil, etc) Three 200mg  tabs four times a day (every meal & bedtime) iii. Acetaminophen (Tylenol, etc) 500-650mg  four times a day (every meal & bedtime) d. A  prescription for pain medication (such as oxycodone, hydrocodone, etc) should be given to you upon discharge.  Take your pain medication as prescribed.  i. If you are having problems/concerns with the prescription medicine (does not control pain, nausea, vomiting, rash, itching, etc), please call us [phone] to see if we need to switch you to a different pain medicine that will work better for you and/or control your side effect better. ii. If you need a refill on your pain medication, please contact your pharmacy.  They will contact our office to request authorization. Prescriptions will not be filled after 5 pm or on week-ends. 4. Avoid getting constipated.  Between the surgery and the pain medications, it is common to experience some constipation.  Increasing fluid intake and taking a fiber supplement (such as Metamucil,  Citrucel, FiberCon, MiraLax, etc) 1-2 times a day regularly will usually help prevent this problem from occurring.  A mild laxative (prune juice, Milk of Magnesia, MiraLax, etc) should be taken according to package directions if there are no bowel movements after 48 hours.   5. Watch out for diarrhea.  If you have many loose bowel movements, simplify your diet to bland foods & liquids for a few days.  Stop any stool softeners and decrease your fiber supplement.  Switching to mild anti-diarrheal medications (Kayopectate, Pepto Bismol) can help.  If this worsens or does not improve, please call us. 6. Wash / shower every day.  You may shower over the dressings as they are waterproof.  Continue to shower over incision(s) after the dressing is off. 7. Remove your waterproof bandages 5 days after surgery.  You may leave the incision open to air.  You may replace a dressing/Band-Aid to cover the incision for comfort if you wish.  8. ACTIVITIES as tolerated:   a. You may resume regular (light) daily activities beginning the next day-such as daily self-care, walking, climbing stairs-gradually increasing activities as tolerated.  If you can walk 30 minutes without difficulty, it is safe to try more intense activity such as jogging, treadmill, bicycling, low-impact aerobics, swimming, etc. b. Save the most intensive and strenuous activity for last such as sit-ups, heavy lifting, contact sports, etc  Refrain from any heavy lifting or straining until you are off narcotics for pain control.   c. DO NOT PUSH THROUGH PAIN.  Let pain be your guide: If it hurts to do something, don't do it.  Pain is your body warning you to avoid that activity for another week until the pain goes down. d. You may drive when you are no longer taking prescription pain medication, you can comfortably wear a seatbelt, and you can safely maneuver your car and apply brakes. e. Bonita Quin may have sexual intercourse when it is comfortable.  9. FOLLOW UP  in our office a. Please call [facility] at [phone] to set up an appointment to see your surgeon in the office for a follow-up appointment approximately 2-3 weeks after your surgery. b. Make sure that you call for this appointment the  day you arrive home to insure a convenient appointment time. 10. IF YOU HAVE DISABILITY OR FAMILY LEAVE FORMS, BRING THEM TO THE OFFICE FOR PROCESSING.  DO NOT GIVE THEM TO YOUR DOCTOR.   WHEN TO CALL us [phone]: 1. Poor pain control 2. Reactions / problems with new medications (rash/itching, nausea, etc)  3. Fever over 101.5 F (38.5 C) 4. Inability to urinate 5. Nausea and/or vomiting 6. Worsening swelling or bruising 7. Continued bleeding from incision. 8. Increased pain, redness, or drainage from the incision   The clinic staff is available to answer your questions during regular business hours (8:30am-5pm).  Please don't hesitate to call and ask to speak to one of our nurses for clinical concerns.   If you have a medical emergency, go to the nearest emergency room or call 911.  A surgeon from [facility] is always on call at the [facility], [place] [address], [place], [place]  [address] ? MAIN: [phone] ? TOLL FREE: [phone] ?  FAX [phone] [email or web address]

## 2012-04-23 DIAGNOSIS — K4020 Bilateral inguinal hernia, without obstruction or gangrene, not specified as recurrent: Secondary | ICD-10-CM

## 2012-04-23 HISTORY — PX: LAPAROSCOPIC INGUINAL HERNIA REPAIR: SUR788

## 2012-04-26 ENCOUNTER — Telehealth (INDEPENDENT_AMBULATORY_CARE_PROVIDER_SITE_OTHER): Payer: Self-pay

## 2012-04-26 ENCOUNTER — Encounter (INDEPENDENT_AMBULATORY_CARE_PROVIDER_SITE_OTHER): Payer: Self-pay

## 2012-04-26 NOTE — Telephone Encounter (Signed)
Called pt to let him know that I did do his RTW note to go back on 05/03/12 at his request. The pt requested I mail the RTW note to his home address. I put it in the mail today.

## 2012-05-06 ENCOUNTER — Telehealth (INDEPENDENT_AMBULATORY_CARE_PROVIDER_SITE_OTHER): Payer: Self-pay | Admitting: General Surgery

## 2012-05-06 NOTE — Telephone Encounter (Signed)
Patient called in stating he was advised over the weekend to call our office to be seen. Patient had hernia repair with mesh on 04/23/12, had bowel movement Saturday and when he was going back to the couch had shooting pain develop, pain now radiating to his back. Patient was told that he needed to be seen. Advised the patient the information would be relayed to Dr. Michaell Cowing and his nurse to advise and determine what is next. Patient agreed.  Called Alisha who advised to page Dr. Michaell Cowing for advise, there may be something the patient can do at home that may help him. If not he will have to be evaluated.

## 2012-05-06 NOTE — Telephone Encounter (Signed)
Called back patient to advise Dr. Michaell Cowing instructed to use an ice pack and take 2 aleve twice per day as needed. Patient was reminded of purpose of the mesh and any small knots he may feel are from the sutures used to secure it in placed. Advised that some the sharp pain he may be feeling are from his nerves reconnecting.

## 2012-05-17 ENCOUNTER — Encounter (INDEPENDENT_AMBULATORY_CARE_PROVIDER_SITE_OTHER): Payer: BC Managed Care – PPO | Admitting: Surgery

## 2012-05-27 ENCOUNTER — Encounter (INDEPENDENT_AMBULATORY_CARE_PROVIDER_SITE_OTHER): Payer: BC Managed Care – PPO | Admitting: Surgery

## 2012-06-04 ENCOUNTER — Encounter (INDEPENDENT_AMBULATORY_CARE_PROVIDER_SITE_OTHER): Payer: BC Managed Care – PPO | Admitting: Surgery

## 2012-06-24 ENCOUNTER — Encounter (INDEPENDENT_AMBULATORY_CARE_PROVIDER_SITE_OTHER): Payer: Self-pay | Admitting: Surgery

## 2012-06-24 ENCOUNTER — Ambulatory Visit (INDEPENDENT_AMBULATORY_CARE_PROVIDER_SITE_OTHER): Payer: BC Managed Care – PPO | Admitting: Surgery

## 2012-06-24 VITALS — BP 122/80 | HR 84 | Temp 98.2°F | Resp 16 | Ht 67.0 in | Wt 163.0 lb

## 2012-06-24 DIAGNOSIS — K4020 Bilateral inguinal hernia, without obstruction or gangrene, not specified as recurrent: Secondary | ICD-10-CM

## 2012-06-24 NOTE — Progress Notes (Signed)
Subjective:     Patient ID: Danny Cohen, male   DOB: 1966/07/17, 46 y.o.   MRN: [id number]  HPI  Danny Cohen  1965/09/26 [id number]  Patient Care Team: Hassan Rowan, MD as PCP - General (Family Medicine)  This patient is a 46 y.o.male who presents today for surgical evaluation.   Patient returns now two months status post laparoscopic repair bilateral inguinal hernias.  He struggled constipation but after taking Metamucil on a laxative (bowels on postop day three.  Had moderate bruising at first but that is fading away.  He felt a lump along his right scrotum.  He cannot find it today.  He has had a few episodes of sharp pains with activity but that is less now.  Initially used an ice pack and then transitioned over to heat.  Not needing it as much now.  He is back to work.  Overall he is feeling much better.  No fevers chills or sweats.  Patient Active Problem List  Diagnosis  . Peptic ulcer due to Helicobacter pylori  . Neck pain, chronic  . Esophageal reflux disease  . Gastric ulcer due to Helicobacter pylori  . Chronic back pain greater than 3 months duration  . Bilateral inguinal hernia (BIH)    Past Medical History  Diagnosis Date  . Esophageal reflux disease 09/03/2011  . Chronic back pain greater than 3 months duration 09/03/2011  . Gastric ulcer due to Helicobacter pylori 09/03/2011  . Peptic ulcer due to Helicobacter pylori 07/06/2011    Past Surgical History  Procedure Date  . Laparoscopic inguinal hernia repair 04/23/2012    bilateral    History   Social History  . Marital Status: Married    Spouse Name: N/A    Number of Children: N/A  . Years of Education: N/A   Occupational History  . Not on file.   Social History Main Topics  . Smoking status: Former Smoker    Quit date: 09/11/2000  . Smokeless tobacco: Not on file  . Alcohol Use: No  . Drug Use: No  . Sexually Active: Not on file   Other Topics Concern  . Not on file   Social History  Narrative  . No narrative on file    No family history on file.  Current Outpatient Prescriptions  Medication Sig Dispense Refill  . esomeprazole (NEXIUM) 40 MG capsule Take 1 capsule (40 mg total) by mouth daily.  30 capsule  6  . ibuprofen (ADVIL,MOTRIN) 200 MG tablet Take 200 mg by mouth as needed.      . celecoxib (CELEBREX) 200 MG capsule Take 200 mg by mouth daily.         Allergies  Allergen Reactions  . Nsaids Other (See Comments)    Pt has history of peptic ulcer     BP 122/80  Pulse 84  Temp 98.2 F (36.8 C) (Temporal)  Resp 16  Ht 5\' 7"  (1.702 m)  Wt 163 lb (73.936 kg)  BMI 25.53 kg/m2  No results found.   Review of Systems  Constitutional: Negative for fever, chills and diaphoresis.  HENT: Negative for sore throat, trouble swallowing and neck pain.   Eyes: Negative for photophobia and visual disturbance.  Respiratory: Negative for choking and shortness of breath.   Cardiovascular: Negative for chest pain and palpitations.  Gastrointestinal: Negative for nausea, vomiting, abdominal distention, anal bleeding and rectal pain.  Genitourinary: Negative for dysuria, urgency, discharge, penile swelling, scrotal swelling, difficulty urinating and penile pain.  Musculoskeletal: Negative for myalgias, arthralgias and gait problem.  Skin: Negative for color change and rash.  Neurological: Negative for dizziness, speech difficulty, weakness and numbness.  Hematological: Negative for adenopathy.  Psychiatric/Behavioral: Negative for hallucinations, confusion and agitation.       Objective:   Physical Exam  Constitutional: He is oriented to person, place, and time. He appears well-developed and well-nourished. No distress.  HENT:  Head: Normocephalic.  Mouth/Throat: Oropharynx is clear and moist. No oropharyngeal exudate.  Eyes: Conjunctivae normal and EOM are normal. Pupils are equal, round, and reactive to light. No scleral icterus.  Neck: Normal range of motion.  No tracheal deviation present.  Cardiovascular: Normal rate, normal heart sounds and intact distal pulses.   Pulmonary/Chest: Effort normal. No respiratory distress.  Abdominal: Soft. He exhibits no distension. There is no tenderness. Hernia confirmed negative in the right inguinal area and confirmed negative in the left inguinal area.       Incisions clean with normal healing ridges.  No hernias  Genitourinary: Testes normal and penis normal. Right testis shows no mass, no swelling and no tenderness. Right testis is descended. Left testis shows no mass, no swelling and no tenderness. Left testis is descended. Circumcised.  Musculoskeletal: Normal range of motion. He exhibits no tenderness.  Lymphadenopathy:       Right: No inguinal adenopathy present.       Left: No inguinal adenopathy present.  Neurological: He is alert and oriented to person, place, and time. No cranial nerve deficit. He exhibits normal muscle tone. Coordination normal.  Skin: Skin is warm and dry. No rash noted. He is not diaphoretic.  Psychiatric: He has a normal mood and affect. His behavior is normal.       Assessment:     2 motnsh s/p lap BIH repairs w mesh, recovering    Plan:     Increase activity as tolerated.  Do not push through pain.  Consider trial of Tylenol and heat around the clock for three weeks to help any lingering further pain.  With history of gastric/peptic ulcers, I cautioned him against regular use of nonsteroidals such as ibuprofen that can cause further problems such as bleeding, perforation, and recurrent ulcers.  He expressed understanding  Lump in scrotum probably was hematoma postsurgical after removal of moderate size cord lipomas associated with the inguinal hernias.  That resolves over time.  He recalls a getting smaller.  He feels reassured.  There some question of a varicocele on the left side.  I do not feel one now.  He had moderate cord lipomas.  I wonder if that was the true  etiology of his lump.  High fiber diet as tolerated. Bowel regimen to avoid problems.  Return to clinic p.r.n. The patient expressed understanding and appreciation

## 2012-06-24 NOTE — Patient Instructions (Addendum)
Managing Pain  Pain after surgery or related to activity is often due to strain/injury to muscle, tendon, nerves and/or incisions.  This pain is usually short-term and will improve in a few months.   Many people find it helpful to do the following things TOGETHER to help speed the process of healing and to get back to regular activity more quickly:  1. Avoid heavy physical activity a.  no lifting greater than 20 pounds b. Do not "push through" the pain.  Listen to your body and avoid positions and maneuvers than reproduce the pain c. Walking is okay as tolerated, but go slowly and stop when getting sore.  d. Remember: If it hurts to do it, then don't do it! 2. Take Anti-inflammatory medication  a. Take with food/snack around the clock for 1-2 weeks i. This helps the muscle and nerve tissues become less irritable and calm down faster b. Choose the following over-the-counter medication: i. Acetaminophen 500mg  tabs (Tylenol) 1-2 pills with every meal and just before bedtime 3. Use a Heating pad or Ice/Cold Pack a. 4-6 times a day b. May use warm bath/hottub  or showers 4. Try Gentle Massage and/or Stretching  a. at the area of pain many times a day b. stop if you feel pain - do not overdo it  Try these steps together to help you body heal faster and avoid making things get worse.  Doing just one of these things may not be enough.    If you are not getting better after two weeks or are noticing you are getting worse, contact our office for further advice; we may need to re-evaluate you & see what other things we can do to help.   Peptic Ulcers Ulcers are small, open craters or sores that develop in the lining of the stomach or the duodenum (the first part of the small intestine). The term peptic ulcer is used to describe both types of ulcers. There are a number of treatments that relieve the discomfort of ulcers. In most cases ulcers do heal.  CAUSES AND COMMON FEATURES OF PEPTIC ULCERS    Peptic ulcers occur only in areas of the digestive system that come in contact with digestive juices. These juices are secreted (given off) by the stomach. They include acid and an enzyme called pepsin that breaks down proteins. Many people with duodenal ulcers have too much digestive juice spilling down from the stomach. Most people with gastric (stomach) ulcers have normal or below normal amounts of stomach acid. Sometimes, when the mucous membrane (protective lining) of the stomach and duodenum does not protect well, this may add to the growth of ulcers. Duodenal ulcers often produces pain in a small area between the breastbone and navel. Pain varies from hunger pain to constant gnawing or burning sensations (feeling). Sometimes the pain is felt during sleep and may awaken the person in the middle of the night. Often the pain occurs two or three hours after eating, when the stomach is empty. Other common symptoms (problems) include overeating for pain relief. Eating relieves the pain of a duodenal ulcer. Gastric ulcer pain may be felt in the same place as the pain of a duodenal ulcer, or slightly higher up. There may also be sensations of feeling full, indigestion, and heartburn. Sometimes pain occurs when the stomach is full. This causes loss of appetite followed by weight loss. HOME CARE INSTRUCTIONS   Use of tobacco products have been found to slow down the healing of an ulcer.  STOP SMOKING.  Avoid alcohol, aspirin, and other inflammation (swelling and soreness) reducing drugs. These substances weaken the stomach lining.  Eat regular, nutritious meals.  Avoid foods that bother you.  Take medications and antacids as directed. Over-the-counter medications are used to neutralize stomach acid. Prescription medications reduce acid secretion, block acid production, or provide a protective coating over the ulcer. If a specific antacid was prescribed, do not switch brands without your caregiver's  approval. Surgery is usually not necessary. Diet and/or drug therapy usually is effective. Surgery may be necessary if perforation, obstruction due to scarring, or uncontrollable bleeding is found, or if severe pain is not otherwise controlled. SEEK IMMEDIATE MEDICAL CARE IF:  You see signs of bleeding. This includes vomiting fresh, bright red blood or passing bloody or tarry, black stools.  You suffer weakness, fatigue, or loss of consciousness. These symptoms can result from severe hemorrhaging (bleeding). Shock may result.  You have sudden, intense, severe abdominal (belly) pain. This is the first sign of a perforation. This would require immediate surgical treatment.  You have intense pain and continued vomiting. This could signal an obstruction of the digestive tract. Document Released: 08/25/2000 Document Revised: 11/20/2011 Document Reviewed: 08/24/2008 [facility] Patient Information 2013 [place], [place].

## 2012-08-27 ENCOUNTER — Encounter: Payer: Self-pay | Admitting: Family Medicine

## 2012-08-27 ENCOUNTER — Ambulatory Visit (INDEPENDENT_AMBULATORY_CARE_PROVIDER_SITE_OTHER): Payer: BC Managed Care – PPO | Admitting: Family Medicine

## 2012-08-27 VITALS — BP 136/78 | HR 69 | Temp 97.8°F | Resp 16 | Ht 67.0 in | Wt 160.0 lb

## 2012-08-27 DIAGNOSIS — L309 Dermatitis, unspecified: Secondary | ICD-10-CM

## 2012-08-27 DIAGNOSIS — K3189 Other diseases of stomach and duodenum: Secondary | ICD-10-CM

## 2012-08-27 DIAGNOSIS — N5089 Other specified disorders of the male genital organs: Secondary | ICD-10-CM

## 2012-08-27 DIAGNOSIS — N508 Other specified disorders of male genital organs: Secondary | ICD-10-CM

## 2012-08-27 DIAGNOSIS — L259 Unspecified contact dermatitis, unspecified cause: Secondary | ICD-10-CM

## 2012-08-27 DIAGNOSIS — R1013 Epigastric pain: Secondary | ICD-10-CM

## 2012-08-27 MED ORDER — MOMETASONE FUROATE 0.1 % EX CREA: TOPICAL_CREAM | CUTANEOUS | Status: AC

## 2012-08-27 NOTE — Progress Notes (Signed)
  Subjective:    Patient ID: Danny Cohen, male    DOB: [birth year], 46 y.o.   MRN: [id number]  HPI R inguinal pain. Patient did have a hernia repair but still concern about the lump in the inguinal area.  There is a small lump/knot in the R scrotal sac that is tender to palpation.  #2 Still having problem w/ gas. Carafate did help w/the burning but still having increase gas.Patient did have a HX of H.Pylori.  #3 Skin rash. Patient has a rash on his right palm/wrist area. Has been ongoing for several months.  #4 immunization update. Review of Systems  All other systems reviewed and are negative.      BP 136/78  Pulse 69  Temp 97.8 F (36.6 C)  Resp 16  Ht 5\' 7"  (1.702 m)  Wt 160 lb (72.576 kg)  BMI 25.06 kg/m2 Objective:   Physical Exam  Vitals reviewed. Constitutional: He is oriented to person, place, and time. He appears well-developed and well-nourished.  HENT:  Head: Normocephalic.  Genitourinary:       Examination of the scrotal sac reveals a very beginning a small nodule area is tender to palpation. Area smooth and no marked tenderness to palpation today  Musculoskeletal: Normal range of motion. He exhibits no edema and no tenderness.  Neurological: He is alert and oriented to person, place, and time.  Skin: Skin is warm. Rash noted.       Think slightly scaly area as increased erythema over the proximal palm and wrist. Consistent with a dermatitis.      Assessment & Plan:  #1 right testicular nodularity. The nodularity palpated today is in the superior aspect of the scrotal sac small and smooth. It would appear to be benign but since it is causing discomfort I think a urological referral is indicated and probable ultrasound of area for evaluation  #2 increase dyspepsia and increase bloatedness and gas. Will make GI referral   #3 dermatitis. Patient has dermatitis of the wrist area. Will use Elocon cream apply once a day to twice a day and recommend using gloves to  keep other substances off of the wrist.  #4 immunization update. Patient declines flu shot.  Return in 4 months for PE with Dr Kary Kos.

## 2012-08-27 NOTE — Patient Instructions (Signed)
Scrotal Swelling °Scrotal swelling may occur on one or both sides of the scrotum. Pain may also occur with swelling. Early evaluation with your caregiver and treatment is important.  °CAUSES  °· Injury. °· Infection. °· An ingrown hair or abrasion in the area. °· Repeated rubbing from tight-fitting underwear. °· Poor hygiene. °· A weakened area in the muscles around the groin (hernia). A hernia can allow abdominal contents to push into the scrotum. °· Fluid around the testicle (hydrocele). °· Enlarged vein around the testicle (varicocele). °· Certain medical treatments or existing conditions. °· A recent genital surgery or procedure. °· The spermatic cord becomes twisted in the scrotum, which cuts off blood supply (testicular torsion). °· Testicular cancer.  °DIAGNOSIS °A physical exam and medical history may be performed. You will be asked questions about recent activity and where and when the swelling began. Further tests may be performed to confirm the diagnosis, such as an imaging test (ultrasound or MRI). °TREATMENT °Treatment will depend on the cause of the swelling. Treatment may include: °· Self care (rest, ice, limiting activity, scrotal support). °· Pain medicine. °· Taking medicines to treat an infection. °· Surgery or help from a specialist (urologist). °HOME CARE INSTRUCTIONS °· Rest and limit activity until the swelling goes away. Lying down is the preferred position. °· Put ice on the scrotum. °· Put ice in a plastic bag. °· Place a towel between your skin and the bag. °· Leave the ice on for 15 to 20 minutes, 3 to 4 times a day for 1 to 2 days. °· Place a rolled towel under the testicles for support. °· Wear loose-fitting clothing or an athletic support cup for comfort. °· Take all medicines as directed by your caregiver. °· Perform a monthly self-exam of the scrotum and penis. Feel for changes. Ask your caregiver how to perform a monthly self-exam if you are unsure. °There are a few things you can  do to help prevent injury or infection that can lead to swelling: °· Wear an athletic support and protective cup during sports activities. °· Practice safe sex. Wear condoms. °· Follow all of your caregiver's recommendations after a surgical procedure. Discuss appropriate time frames for healing and bedrest with your surgeon. °· Get vaccinated against mumps, measles, and rubella (MMR). °· Use good body mechanics during activity and when lifting heavy objects.  Avoid bearing down or straining. °· Drink enough fluids to keep your urine clear or pale yellow. Always empty the bladder completely when urinating to avoid infection. °Finding out the results of your test °Ask when your test results will be ready. Make sure you get your test results. °SEEK MEDICAL CARE IF: °· You have a sudden (acute) onset of pain that is persistent and not improving. °· You notice a heavy feeling or fluid in the scrotum. °· You have pain or burning while urinating. °· You have blood in the urine or semen. °· You feel a lump around the testicle. °· You notice that one testicle is larger than the other (slight variation is normal). °· You have a persistent dull ache or pain in the groin or scrotum. °· You need instruction on performing a monthly self-exam. °SEEK IMMEDIATE MEDICAL CARE IF: °· The pain does not go away or becomes severe. °· You have a fever. °· You have pain or vomiting that cannot be controlled. °· You notice significant redness or swelling of one or both sides of the scrotum. °MAKE SURE YOU: °· Understand these instructions. °· Will watch   your condition. °· Will get help right away if you are not doing well or get worse. °Document Released: 09/30/2010 Document Revised: 11/20/2011 Document Reviewed: 09/30/2010 °[employer]® Patient Information ©2013 [employer]. ° °

## 2012-09-02 ENCOUNTER — Encounter: Payer: Self-pay | Admitting: Family Medicine

## 2012-09-02 DIAGNOSIS — R350 Frequency of micturition: Secondary | ICD-10-CM | POA: Insufficient documentation

## 2012-11-05 ENCOUNTER — Other Ambulatory Visit: Payer: Self-pay | Admitting: *Deleted

## 2012-11-05 DIAGNOSIS — B9681 Helicobacter pylori [H. pylori] as the cause of diseases classified elsewhere: Secondary | ICD-10-CM

## 2012-11-05 DIAGNOSIS — R1013 Epigastric pain: Secondary | ICD-10-CM

## 2012-11-05 DIAGNOSIS — K219 Gastro-esophageal reflux disease without esophagitis: Secondary | ICD-10-CM

## 2012-11-05 MED ORDER — ESOMEPRAZOLE MAGNESIUM 40 MG PO CPDR: 40.0000 mg | DELAYED_RELEASE_CAPSULE | Freq: Every day | ORAL | Status: DC

## 2012-11-21 ENCOUNTER — Encounter: Payer: Self-pay | Admitting: Family Medicine

## 2012-11-26 ENCOUNTER — Encounter: Payer: Self-pay | Admitting: Family Medicine

## 2012-12-16 ENCOUNTER — Encounter: Payer: Self-pay | Admitting: Family Medicine

## 2012-12-16 DIAGNOSIS — D1803 Hemangioma of intra-abdominal structures: Secondary | ICD-10-CM | POA: Insufficient documentation

## 2012-12-26 ENCOUNTER — Encounter: Payer: Self-pay | Admitting: Family Medicine

## 2013-01-06 ENCOUNTER — Other Ambulatory Visit: Payer: Self-pay | Admitting: Family Medicine

## 2013-02-25 ENCOUNTER — Ambulatory Visit: Payer: BC Managed Care – PPO | Admitting: Family Medicine

## 2013-02-25 DIAGNOSIS — Z0289 Encounter for other administrative examinations: Secondary | ICD-10-CM

## 2013-03-04 ENCOUNTER — Emergency Department (INDEPENDENT_AMBULATORY_CARE_PROVIDER_SITE_OTHER)
Admission: EM | Admit: 2013-03-04 | Discharge: 2013-03-04 | Disposition: A | Discharge status: Home / Self Care | Payer: BC Managed Care – PPO | Source: Home / Self Care | Attending: Family Medicine | Admitting: Family Medicine

## 2013-03-04 ENCOUNTER — Encounter: Payer: Self-pay | Admitting: *Deleted

## 2013-03-04 DIAGNOSIS — L255 Unspecified contact dermatitis due to plants, except food: Secondary | ICD-10-CM

## 2013-03-04 MED ORDER — PREDNISONE 20 MG PO TABS: 20.0000 mg | ORAL_TABLET | Freq: Two times a day (BID) | ORAL | Status: DC

## 2013-03-04 NOTE — ED Provider Notes (Signed)
   History    CSN: [id number] Arrival date & time 03/04/13  1633  First MD Initiated Contact with Patient 03/04/13 1720     Chief Complaint  Patient presents with  . Rash     HPI Comments: Patient cut down a tree with poison ivy on it about two weeks ago, and then began developing pruritic rash on neck, hands, trunk.  The rash has persisted.  He feels well otherwise.  Patient is a 47 y.o. male presenting with poison ivy. The history is provided by the patient.  Poison [place] This is a new problem. Episode onset: 2 weeks ago. The problem occurs constantly. The problem has not changed since onset.Associated symptoms comments: none. Nothing aggravates the symptoms. Nothing relieves the symptoms. He has tried nothing for the symptoms.   Past Medical History  Diagnosis Date  . Esophageal reflux disease 09/03/2011  . Chronic back pain greater than 3 months duration 09/03/2011  . Gastric ulcer due to Helicobacter pylori 09/03/2011  . Peptic ulcer due to Helicobacter pylori 07/06/2011   Past Surgical History  Procedure Laterality Date  . Laparoscopic inguinal hernia repair  04/23/2012    bilateral  . Hernia repair     Family History  Problem Relation Age of Onset  . Heart failure Mother    History  Substance Use Topics  . Smoking status: Former Smoker    Quit date: 09/11/2000  . Smokeless tobacco: Not on file  . Alcohol Use: No    Review of Systems  All other systems reviewed and are negative.    Allergies  Nsaids  Home Medications   Current Outpatient Rx  Name  Route  Sig  Dispense  Refill  . ibuprofen (ADVIL,MOTRIN) 200 MG tablet   Oral   Take 200 mg by mouth as needed.         . mometasone (ELOCON) 0.1 % cream      Apply to affected area daily   45 g   1   . NEXIUM 40 MG capsule      TAKE 1 CAPSULE (40 MG TOTAL) BY MOUTH DAILY.   30 capsule   1   . predniSONE (DELTASONE) 20 MG tablet   Oral   Take 1 tablet (20 mg total) by mouth 2 (two) times daily. Take  with food.   10 tablet   0    BP 110/71  Pulse 66  Temp(Src) 98.1 F (36.7 C) (Oral)  Resp 16  Wt 155 lb (70.308 kg)  BMI 24.27 kg/m2  SpO2 98% Physical Exam  Nursing note and vitals reviewed. Constitutional: He is oriented to person, place, and time. He appears well-developed and well-nourished. No distress.  HENT:  Head: Normocephalic and atraumatic.  Mouth/Throat: Oropharynx is clear and moist.  Eyes: Conjunctivae are normal. Pupils are equal, round, and reactive to light.  Lymphadenopathy:    He has no cervical adenopathy.  Neurological: He is alert and oriented to person, place, and time.  Skin: Skin is warm and dry. Rash noted. Rash is maculopapular.     Rhus eruption noted on left neck, left shoulder, and hands.  No evidence secondary infection    ED Course  Procedures  none   1. Rhus dermatitis     MDM  Begin prednisone burst. Take Benadryl 50mg  at bedtime Call if not resolved 5 days.  Lattie Haw, MD 03/04/13 [phone]

## 2013-03-04 NOTE — ED Notes (Signed)
Pt c/o itching rash on his neck, arms and face x 2 wks.

## 2013-08-04 ENCOUNTER — Encounter: Payer: Self-pay | Admitting: Family Medicine

## 2013-12-24 ENCOUNTER — Ambulatory Visit (INDEPENDENT_AMBULATORY_CARE_PROVIDER_SITE_OTHER): Payer: BC Managed Care – PPO | Admitting: Family Medicine

## 2013-12-24 ENCOUNTER — Encounter: Payer: Self-pay | Admitting: Family Medicine

## 2013-12-24 VITALS — BP 121/78 | HR 64 | Temp 97.9°F | Wt 165.0 lb

## 2013-12-24 DIAGNOSIS — N509 Disorder of male genital organs, unspecified: Secondary | ICD-10-CM

## 2013-12-24 DIAGNOSIS — M545 Low back pain, unspecified: Secondary | ICD-10-CM

## 2013-12-24 DIAGNOSIS — N5082 Scrotal pain: Secondary | ICD-10-CM

## 2013-12-24 LAB — POCT URINALYSIS DIPSTICK
BILIRUBIN UA: NEGATIVE
Blood, UA: NEGATIVE
Glucose, UA: NEGATIVE
Ketones, UA: NEGATIVE
LEUKOCYTES UA: NEGATIVE
NITRITE UA: NEGATIVE
PH UA: 7
PROTEIN UA: NEGATIVE
Spec Grav, UA: 1.01
UROBILINOGEN UA: 0.2

## 2013-12-24 MED ORDER — MELOXICAM 15 MG PO TABS: 15.0000 mg | ORAL_TABLET | Freq: Every day | ORAL | Status: DC

## 2013-12-24 NOTE — Progress Notes (Signed)
CC: Danny Cohen is a 48 y.o. male is here for Back Pain   Subjective: HPI:  Complains of right greater than left groin pain that has been present for years comes and goes on a weekly basis. Described as a pulsatile stabbing pain that radiates from his right lower quadrant into his right testicle. It is worse with sitting for long periods of time. It improves with heat applied to the groin. Interventions and workup have included bilateral inguinal hernia repair which did not help or worsen pain. He has been seen by urology with a diagnosis of a "collapsed vein" in both scrotums, he has tried ibuprofen and Aleve for this however no improvement. He denies dysuria, penile discharge, nor blood in urine. Denies testicular swelling. He can reproduce his pain by palpating the top of his scrotum. Denies any history of kidney stones.  Denies any recent or remote fevers, chills, abdominal pain (other than described above) GI complaints, nausea, vomiting, nor isolated flank pain   Review Of Systems Outlined In HPI  Past Medical History  Diagnosis Date  . Esophageal reflux disease 09/03/2011  . Chronic back pain greater than 3 months duration 09/03/2011  . Gastric ulcer due to Helicobacter pylori 99/83/3825  . Peptic ulcer due to Helicobacter pylori 05/39/7673    Past Surgical History  Procedure Laterality Date  . Laparoscopic inguinal hernia repair  04/23/2012    bilateral  . Hernia repair     Family History  Problem Relation Age of Onset  . Heart failure Mother     History   Social History  . Marital Status: Married    Spouse Name: N/A    Number of Children: N/A  . Years of Education: N/A   Occupational History  . Not on file.   Social History Main Topics  . Smoking status: Former Smoker    Quit date: 09/11/2000  . Smokeless tobacco: Not on file  . Alcohol Use: No  . Drug Use: No  . Sexual Activity: Not on file   Other Topics Concern  . Not on file   Social History Narrative   . No narrative on file     Objective: BP 121/78  Pulse 64  Temp(Src) 97.9 F (36.6 C) (Oral)  Wt 165 lb (74.844 kg)  General: Alert and Oriented, No Acute Distress HEENT: Pupils equal, round, reactive to light. Conjunctivae clear.  Moist membranes person unremarkable Lungs: Clear to auscultation bilaterally, no wheezing/ronchi/rales.  Comfortable work of breathing. Good air movement. Cardiac: Regular rate and rhythm. Normal S1/S2.  No murmurs, rubs, nor gallops.   Abdomen: Normal bowel sounds, soft and non tender without palpable masses. Genitourinary: Unremarkable penis, bilateral descended testes which are nontender to palpation. Mild right scrotal fullness, right and left testicles are smooth without palpable masses.  Pain is reproduced with palpation of external inguinal canal on the right, no palpable change at this location with Valsalva or cough. Mental Status: No depression, anxiety, nor agitation. Skin: Warm and dry.  Assessment & Plan: Danny Cohen was seen today for back pain.  Diagnoses and associated orders for this visit:  Lower back pain - Urinalysis Dipstick  Scrotum pain - US Scrotum; Future - meloxicam (MOBIC) 15 MG tablet; Take 1 tablet (15 mg total) by mouth daily. - Urine culture    Urinalysis is unremarkable we will follow culture. Obtaining scrotal ultrasound suspect varicocele or hydrocele therefore start meloxicam.Signs and symptoms requring emergent/urgent reevaluation were discussed with the patient.   Return if symptoms worsen  or fail to improve.

## 2013-12-25 ENCOUNTER — Other Ambulatory Visit: Payer: Self-pay | Admitting: Family Medicine

## 2013-12-25 ENCOUNTER — Ambulatory Visit (HOSPITAL_BASED_OUTPATIENT_CLINIC_OR_DEPARTMENT_OTHER)
Admission: RE | Admit: 2013-12-25 | Discharge: 2013-12-25 | Disposition: A | Discharge status: Home / Self Care | Payer: BC Managed Care – PPO | Source: Ambulatory Visit | Attending: Family Medicine | Admitting: Family Medicine

## 2013-12-25 DIAGNOSIS — N5082 Scrotal pain: Secondary | ICD-10-CM

## 2013-12-25 DIAGNOSIS — N508 Other specified disorders of male genital organs: Secondary | ICD-10-CM | POA: Insufficient documentation

## 2013-12-25 DIAGNOSIS — I861 Scrotal varices: Secondary | ICD-10-CM | POA: Insufficient documentation

## 2013-12-26 ENCOUNTER — Telehealth: Payer: Self-pay | Admitting: Family Medicine

## 2013-12-26 NOTE — Telephone Encounter (Signed)
Pt.notified

## 2013-12-26 NOTE — Telephone Encounter (Signed)
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  This was the condition we discussed was most likely during his visit this week.  Continue with meloxicam as needed for pain, I'd also encourage trying to use supportive and snug underwear to see if this helps relieve pain.  If pain is not improving with this plan I'd recommend f/u with his urologist.  No other abnormalities were seen on his ultrasound.

## 2013-12-27 LAB — URINE CULTURE
Colony Count: NO GROWTH
Organism ID, Bacteria: NO GROWTH

## 2014-03-20 ENCOUNTER — Telehealth: Payer: Self-pay | Admitting: *Deleted

## 2014-03-20 NOTE — Telephone Encounter (Signed)
[facility] Urology Dept request scrotal u/s and last office note. Pt is there in their office now. U/s and last office note sent to  [phone]

## 2014-03-30 ENCOUNTER — Encounter: Payer: Self-pay | Admitting: Family Medicine

## 2014-03-31 ENCOUNTER — Encounter: Payer: Self-pay | Admitting: Family Medicine

## 2014-03-31 ENCOUNTER — Ambulatory Visit (INDEPENDENT_AMBULATORY_CARE_PROVIDER_SITE_OTHER): Payer: BC Managed Care – PPO | Admitting: Family Medicine

## 2014-03-31 ENCOUNTER — Telehealth: Payer: Self-pay | Admitting: *Deleted

## 2014-03-31 VITALS — BP 117/76 | HR 68 | Ht 67.0 in | Wt 165.0 lb

## 2014-03-31 DIAGNOSIS — R10829 Rebound abdominal tenderness, unspecified site: Secondary | ICD-10-CM

## 2014-03-31 DIAGNOSIS — R10819 Abdominal tenderness, unspecified site: Secondary | ICD-10-CM

## 2014-03-31 DIAGNOSIS — M5412 Radiculopathy, cervical region: Secondary | ICD-10-CM

## 2014-03-31 DIAGNOSIS — R109 Unspecified abdominal pain: Secondary | ICD-10-CM

## 2014-03-31 MED ORDER — TRAMADOL HCL 50 MG PO TABS: 50.0000 mg | ORAL_TABLET | Freq: Three times a day (TID) | ORAL | Status: DC | PRN

## 2014-03-31 NOTE — Telephone Encounter (Signed)
MRI of abd/pelvis [id number] approved by Merry Proud @ [facility] [place] 7/21-8/19/15. Auth # [id number]. Margette Fast, CMA
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
## 2014-04-02 ENCOUNTER — Telehealth: Payer: Self-pay | Admitting: Family Medicine

## 2014-04-02 ENCOUNTER — Ambulatory Visit (INDEPENDENT_AMBULATORY_CARE_PROVIDER_SITE_OTHER): Payer: BC Managed Care – PPO

## 2014-04-02 DIAGNOSIS — R109 Unspecified abdominal pain: Secondary | ICD-10-CM

## 2014-04-02 DIAGNOSIS — R10829 Rebound abdominal tenderness, unspecified site: Secondary | ICD-10-CM

## 2014-04-02 DIAGNOSIS — K2970 Gastritis, unspecified, without bleeding: Secondary | ICD-10-CM

## 2014-04-02 DIAGNOSIS — R10819 Abdominal tenderness, unspecified site: Secondary | ICD-10-CM

## 2014-04-02 MED ORDER — CIPROFLOXACIN HCL 500 MG PO TABS: 500.0000 mg | ORAL_TABLET | Freq: Two times a day (BID) | ORAL | Status: DC

## 2014-04-02 MED ORDER — METRONIDAZOLE 500 MG PO TABS
500.0000 mg | ORAL_TABLET | Freq: Three times a day (TID) | ORAL | Status: DC
Start: 2014-04-02 — End: 2014-08-25

## 2014-04-02 MED ORDER — IOHEXOL 300 MG/ML  SOLN
100.0000 mL | Freq: Once | INTRAMUSCULAR | Status: AC | PRN
  Administered 2014-04-02: 100 mL via INTRAVENOUS

## 2014-04-02 NOTE — Telephone Encounter (Signed)
Pt.notified

## 2014-04-02 NOTE — Telephone Encounter (Signed)
Danny Cohen please let the patient know that his CT scan showed a liver hemangioma which we are any new about, 2014 ultrasound but a new finding of inflammation in his small intestine. I would encourage him to start on a combination of antibiotics consisting of ciprofloxacin and metronidazole which should relieve his discomfort if this is due to a bacterial infection. If no improvement over the weekend please contact me so I can refer him to gastroenterology. I sent prescriptions for these medications to his [facility] in [place]

## 2014-04-06 ENCOUNTER — Telehealth: Payer: Self-pay | Admitting: Family Medicine

## 2014-04-06 DIAGNOSIS — R1084 Generalized abdominal pain: Secondary | ICD-10-CM

## 2014-04-06 DIAGNOSIS — K529 Noninfective gastroenteritis and colitis, unspecified: Secondary | ICD-10-CM

## 2014-04-06 NOTE — Telephone Encounter (Signed)
Mr. Lannen called.  He would like a referral to a Copywriter, advertising.  Thank you.

## 2014-04-06 NOTE — Telephone Encounter (Signed)
Patient called and request to get a referral to see a Gastro Dr. Marina Gravel

## 2014-04-07 NOTE — Telephone Encounter (Signed)
Referral has been placed under "urgent" status.

## 2014-04-07 NOTE — Telephone Encounter (Signed)
Pt.notified

## 2014-04-13 ENCOUNTER — Encounter: Payer: Self-pay | Admitting: Family Medicine

## 2014-04-13 DIAGNOSIS — R933 Abnormal findings on diagnostic imaging of other parts of digestive tract: Secondary | ICD-10-CM | POA: Insufficient documentation

## 2014-04-20 ENCOUNTER — Encounter: Payer: Self-pay | Admitting: Family Medicine

## 2014-04-20 ENCOUNTER — Other Ambulatory Visit: Payer: Self-pay | Admitting: Neurosurgery

## 2014-04-20 DIAGNOSIS — M5412 Radiculopathy, cervical region: Secondary | ICD-10-CM

## 2014-04-29 ENCOUNTER — Ambulatory Visit (INDEPENDENT_AMBULATORY_CARE_PROVIDER_SITE_OTHER): Payer: BC Managed Care – PPO

## 2014-04-29 DIAGNOSIS — M129 Arthropathy, unspecified: Secondary | ICD-10-CM

## 2014-04-29 DIAGNOSIS — M5030 Other cervical disc degeneration, unspecified cervical region: Secondary | ICD-10-CM

## 2014-04-29 DIAGNOSIS — M5380 Other specified dorsopathies, site unspecified: Secondary | ICD-10-CM

## 2014-04-29 DIAGNOSIS — M5412 Radiculopathy, cervical region: Secondary | ICD-10-CM

## 2014-04-29 DIAGNOSIS — M5020 Other cervical disc displacement, unspecified cervical region: Secondary | ICD-10-CM

## 2014-04-29 DIAGNOSIS — M542 Cervicalgia: Secondary | ICD-10-CM

## 2014-04-30 ENCOUNTER — Encounter: Payer: Self-pay | Admitting: Family Medicine

## 2014-06-22 ENCOUNTER — Encounter: Payer: Self-pay | Admitting: Family Medicine

## 2014-06-22 LAB — BASIC METABOLIC PANEL: Potassium: 5.4 mmol/L — AB (ref 3.4–5.3)

## 2014-06-25 ENCOUNTER — Telehealth: Payer: Self-pay | Admitting: Family Medicine

## 2014-06-25 DIAGNOSIS — E875 Hyperkalemia: Secondary | ICD-10-CM | POA: Insufficient documentation

## 2014-06-25 NOTE — Telephone Encounter (Signed)
Left message on pt.'s vm.

## 2014-06-25 NOTE — Telephone Encounter (Signed)
Danny Cohen, Will you please let patient know that Dr. Shary Key (GI) sent me his recent blood work with a mildly elevated potassium. I'd encourage him to have this rechecked here to determine if it was an outlier or something we need to work up (lab slip in your inbox).

## 2014-08-25 ENCOUNTER — Ambulatory Visit (INDEPENDENT_AMBULATORY_CARE_PROVIDER_SITE_OTHER): Payer: BC Managed Care – PPO | Admitting: Family Medicine

## 2014-08-25 ENCOUNTER — Encounter: Payer: Self-pay | Admitting: Family Medicine

## 2014-08-25 VITALS — BP 124/77 | HR 66 | Wt 163.0 lb

## 2014-08-25 DIAGNOSIS — M542 Cervicalgia: Secondary | ICD-10-CM

## 2014-08-25 MED ORDER — PREDNISONE 20 MG PO TABS: ORAL_TABLET | ORAL | Status: AC

## 2014-08-25 MED ORDER — TRAMADOL HCL 50 MG PO TABS: 50.0000 mg | ORAL_TABLET | Freq: Three times a day (TID) | ORAL | Status: DC | PRN

## 2014-08-25 NOTE — Progress Notes (Signed)
CC: Danny Cohen is a 48 y.o. male is here for Neck Pain   Subjective: HPI:  Complains of right posterior neck pain that has been present for the past week. It is described as only pain and swelling that is worse the more he presses on the swollen lump. No interventions as of yet. Nothing makes better or worse other than that described above. He denies any overexertion or trauma. He continues to have pain in the base of the neck that has been present for many months now and has been intervened on by steroid injections however they've only lasted 1-4 days. This pain is described as a collector sensation from the back of the neck down the left shoulder into the left arm all the way to the fingers. His chief complaint today is different than this chronic pain. He denies any other new motor or sensory disturbances. Denies any overlying skin changes.   Review Of Systems Outlined In HPI  Past Medical History  Diagnosis Date  . Esophageal reflux disease 09/03/2011  . Chronic back pain greater than 3 months duration 09/03/2011  . Gastric ulcer due to Helicobacter pylori 68/07/5725  . Peptic ulcer due to Helicobacter pylori 20/35/5974    Past Surgical History  Procedure Laterality Date  . Laparoscopic inguinal hernia repair  04/23/2012    bilateral  . Hernia repair     Family History  Problem Relation Age of Onset  . Heart failure Mother     History   Social History  . Marital Status: Married    Spouse Name: N/A    Number of Children: N/A  . Years of Education: N/A   Occupational History  . Not on file.   Social History Main Topics  . Smoking status: Former Smoker    Quit date: 09/11/2000  . Smokeless tobacco: Not on file  . Alcohol Use: No  . Drug Use: No  . Sexual Activity: Not on file   Other Topics Concern  . Not on file   Social History Narrative     Objective: BP 124/77 mmHg  Pulse 66  Wt 163 lb (73.936 kg)  General: Alert and Oriented, No Acute  Distress HEENT: Pupils equal, round, reactive to light. Conjunctivae clear.  Moist mucous membranes Lungs: clearing comfortable work of breathing Cardiac: Regular rate and rhythm.  Neck: Full range of motion and strengthen the cervical spine with no midline spinous process tenderness. Overlying the posterior lateral superior trapezius muscle there is a tender mass about the size of a gr of ricewith no overlying skin changes Extremities: No peripheral edema.  Strong peripheral pulses.  Mental Status: No depression, anxiety, nor agitation. Skin: Warm and dry.  Assessment & Plan: Danny Cohen was seen today for neck pain.  Diagnoses and associated orders for this visit:  Neck pain - predniSONE (DELTASONE) 20 MG tablet; Three tabs daily days 1-3, two tabs daily days 4-6, one tab daily days 7-9, half tab daily days 10-13. - traMADol (ULTRAM) 50 MG tablet; Take 1 tablet (50 mg total) by mouth every 8 (eight) hours as needed.    I suspect he has a sebaceous cyst on the right posterior neck and advised him to avoid manipulation as much as possible. Begin prednisone to help with this information and also to help with his left cervical radiculitis.   Return if symptoms worsen or fail to improve.

## 2014-08-26 ENCOUNTER — Telehealth: Payer: Self-pay | Admitting: Family Medicine

## 2014-08-26 DIAGNOSIS — R739 Hyperglycemia, unspecified: Secondary | ICD-10-CM

## 2014-08-26 LAB — BASIC METABOLIC PANEL WITH GFR
BUN: 13 mg/dL (ref 6–23)
CHLORIDE: 102 meq/L (ref 96–112)
CO2: 31 mEq/L (ref 19–32)
Calcium: 9.8 mg/dL (ref 8.4–10.5)
Creat: 0.95 mg/dL (ref 0.50–1.35)
GFR, Est African American: >89 mL/min
GFR, Est Non African American: >89 mL/min
GLUCOSE: 100 mg/dL — AB (ref 70–99)
POTASSIUM: 4.1 meq/L (ref 3.5–5.3)
Sodium: 140 mEq/L (ref 135–145)

## 2014-08-26 NOTE — Telephone Encounter (Signed)
Danny Cohen, Will you please let patient know that his potassium level is back in the normal range.  The only abnormality was a one point elevation in his blood sugar above the upper limit of normal.  I'd recommend having an A1c checked if the lab can add this on, if not a lab slip is in your inbox.

## 2014-08-26 NOTE — Telephone Encounter (Signed)
Pt notified and advised him to go to the lab. Order faxed

## 2014-08-29 LAB — HEMOGLOBIN A1C
Hgb A1c MFr Bld: 5.4 % (ref <5.7)
MEAN PLASMA GLUCOSE: 108 mg/dL (ref <117)

## 2014-09-01 ENCOUNTER — Ambulatory Visit (INDEPENDENT_AMBULATORY_CARE_PROVIDER_SITE_OTHER): Payer: BC Managed Care – PPO | Admitting: Family Medicine

## 2014-09-01 ENCOUNTER — Encounter: Payer: Self-pay | Admitting: Family Medicine

## 2014-09-01 VITALS — BP 112/61 | HR 73 | Ht 67.0 in | Wt 163.0 lb

## 2014-09-01 DIAGNOSIS — M542 Cervicalgia: Secondary | ICD-10-CM

## 2014-09-01 MED ORDER — METHOCARBAMOL 500 MG PO TABS: 500.0000 mg | ORAL_TABLET | Freq: Three times a day (TID) | ORAL | Status: DC | PRN

## 2014-09-01 NOTE — Progress Notes (Signed)
CC: Danny Cohen is a 48 y.o. male is here for Follow-up   Subjective: HPI:  Complains of continued right posterior neck pain that has been present for matter of months now but more problematic over the last 2-3 weeks. There's been no change in his pain despite a week of not manipulating this region with his hands and begin a prednisone taper. He also doesn't think tramadol affects the pain whatsoever. Symptoms are moderate in severity, nonradiating, and not reproducible with movement of the neck. He currently describes his pain as occurring in 5-10 minute episodes where intense throbbing occurs and will go away with either heat application or with ignoring it. Nothing particularly makes it better or worse that he knows of. He denies any overlying skin changes nor any motor or sensory disturbances in the upper exam is other than an electric sensation from the posterior left neck down to the left hand that has improved slightly with prednisone use.  There is been no dysphagia fevers chills or unintentional weight loss  Review Of Systems Outlined In HPI  Past Medical History  Diagnosis Date  . Esophageal reflux disease 09/03/2011  . Chronic back pain greater than 3 months duration 09/03/2011  . Gastric ulcer due to Helicobacter pylori 54/56/2563  . Peptic ulcer due to Helicobacter pylori 89/37/3428    Past Surgical History  Procedure Laterality Date  . Laparoscopic inguinal hernia repair  04/23/2012    bilateral  . Hernia repair     Family History  Problem Relation Age of Onset  . Heart failure Mother     History   Social History  . Marital Status: Married    Spouse Name: N/A    Number of Children: N/A  . Years of Education: N/A   Occupational History  . Not on file.   Social History Main Topics  . Smoking status: Former Smoker    Quit date: 09/11/2000  . Smokeless tobacco: Not on file  . Alcohol Use: No  . Drug Use: No  . Sexual Activity: Not on file   Other Topics  Concern  . Not on file   Social History Narrative     Objective: BP 112/61 mmHg  Pulse 73  Ht 5\' 7"  (1.702 m)  Wt 163 lb (73.936 kg)  BMI 25.52 kg/m2  General: Alert and Oriented, No Acute Distress HEENT: Pupils equal, round, reactive to light. Conjunctivae clear.  Moist mucous membranes times unremarkable. No palpable masses in the anterior neck however on the posterior neck just lateral to the right of C6 there is a firm non-mobile mass about the size of a grain of rice which is tender Lungs: Clear comfortable work of breathing Cardiac: Regular rate and rhythm  Extremities: No peripheral edema.  Strong peripheral pulses.  Mental Status: No depression, anxiety, nor agitation. Skin: Warm and dry.  Assessment & Plan: Danny Cohen was seen today for follow-up.  Diagnoses and associated orders for this visit:  Neck pain - methocarbamol (ROBAXIN) 500 MG tablet; Take 1 tablet (500 mg total) by mouth every 8 (eight) hours as needed (neck pain). - US Soft Tissue Head/Neck; Future    I reviewed his MRI radiology report does not mention any soft tissue masses and I'm unable to appreciate any masses on any of the images near the region I'm able to palpate. Try Robaxin for pain relief and obtain ultrasound for further evaluation of this mass.  No Follow-up on file.

## 2014-09-07 ENCOUNTER — Encounter: Payer: Self-pay | Admitting: Family Medicine

## 2014-09-07 ENCOUNTER — Ambulatory Visit (INDEPENDENT_AMBULATORY_CARE_PROVIDER_SITE_OTHER): Payer: BC Managed Care – PPO | Admitting: Family Medicine

## 2014-09-07 VITALS — BP 107/64 | HR 71 | Ht 67.0 in | Wt 164.0 lb

## 2014-09-07 DIAGNOSIS — H109 Unspecified conjunctivitis: Secondary | ICD-10-CM

## 2014-09-07 MED ORDER — POLYMYXIN B-TRIMETHOPRIM 10000-0.1 UNIT/ML-% OP SOLN: 2.0000 [drp] | OPHTHALMIC | Status: DC

## 2014-09-07 NOTE — Progress Notes (Signed)
CC: Danny Cohen is a 48 y.o. male is here for No chief complaint on file.   Subjective: HPI:  Right eye irritation that has been present since Friday this past week. 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 He was wearing safety goggles when doing this. On Saturday he also was working with fiberglass but was wearing safety goggles. Symptoms were mild in severity with mild buildup in the mornings however last night he began to have a little worsening of the pain described as throbbing. He denies any vision loss, photophobia, nor drainage from the eye other than grit described above. As of yesterday he also said to have a similar sensation in his left eye. Denies pain with moving the eye or blinking.  Review of systems outlined in history of present illness  Past Medical History  Diagnosis Date  . Esophageal reflux disease 09/03/2011  . Chronic back pain greater than 3 months duration 09/03/2011  . Gastric ulcer due to Helicobacter pylori 08/65/7846  . Peptic ulcer due to Helicobacter pylori 96/29/5284    Past Surgical History  Procedure Laterality Date  . Laparoscopic inguinal hernia repair  04/23/2012    bilateral  . Hernia repair     Family History  Problem Relation Age of Onset  . Heart failure Mother     History   Social History  . Marital Status: Married    Spouse Name: N/A    Number of Children: N/A  . Years of Education: N/A   Occupational History  . Not on file.   Social History Main Topics  . Smoking status: Former Smoker    Quit date: 09/11/2000  . Smokeless tobacco: Not on file  . Alcohol Use: No  . Drug Use: No  . Sexual Activity: Not on file   Other Topics Concern  . Not on file   Social History Narrative     Objective: BP 107/64 mmHg  Pulse 71  Ht 5\' 7"  (1.702 m)  Wt 164 lb (74.39 kg)  BMI 25.68 kg/m2  General: Alert and Oriented, No Acute Distress HEENT: Pupils equal, round, reactive to light. anterior  chamber of both eyes are open without debris. Vision grossly intact. Under florcein stain and woods lap the 3:00 to 4:00 position of the right conjunctiva has very mild cluster of 4-5 bumps each of which is less than a fraction of a millimeter in size. Almost a sandpaper appearance no greater than 2 mm diameter total overall. The cornea is unremarkable bilaterally. Lungs: Clear and comfortable work of breathing Cardiac: Regular rate and rhythm.  Mental Status: No depression, anxiety, nor agitation. Skin: Warm and dry.  Assessment & Plan: Danny Cohen was seen today for no specified reason.  Diagnoses and associated orders for this visit:  Bilateral conjunctivitis - trimethoprim-polymyxin b (POLYTRIM) ophthalmic solution; Place 2 drops into both eyes every 4 (four) hours. For seven days.     the area that is highlighted under Woods lamp appears unremarkable under normal lighting and I do not appreciate any foreign body. I believe what I'm seeing is most likely superficial damage to the conjunctiva from a foreign body that is now no longer present. Provided with Polytrim to prevent any bacterial infection and encouraged to go see his ophthalmologist if worsening between now and Wednesday or see ophthalmologist on Wednesday if no better.  Return if symptoms worsen or fail to improve.

## 2014-09-08 ENCOUNTER — Ambulatory Visit (INDEPENDENT_AMBULATORY_CARE_PROVIDER_SITE_OTHER): Payer: BC Managed Care – PPO

## 2014-09-08 VITALS — Resp 20

## 2014-09-08 DIAGNOSIS — R221 Localized swelling, mass and lump, neck: Secondary | ICD-10-CM

## 2014-09-08 DIAGNOSIS — M47812 Spondylosis without myelopathy or radiculopathy, cervical region: Secondary | ICD-10-CM

## 2014-09-08 DIAGNOSIS — M542 Cervicalgia: Secondary | ICD-10-CM

## 2014-09-09 DIAGNOSIS — M47812 Spondylosis without myelopathy or radiculopathy, cervical region: Secondary | ICD-10-CM | POA: Insufficient documentation

## 2014-09-09 NOTE — Progress Notes (Unsigned)
Referral has been placed per most recent results for the ultrasound results.

## 2014-09-12 ENCOUNTER — Encounter: Payer: Self-pay | Admitting: Family Medicine

## 2014-09-17 ENCOUNTER — Encounter: Payer: Self-pay | Admitting: Family Medicine

## 2014-09-23 ENCOUNTER — Ambulatory Visit (INDEPENDENT_AMBULATORY_CARE_PROVIDER_SITE_OTHER): Payer: BLUE CROSS/BLUE SHIELD | Admitting: Family Medicine

## 2014-09-23 ENCOUNTER — Encounter: Payer: Self-pay | Admitting: Family Medicine

## 2014-09-23 VITALS — BP 105/68 | HR 63 | Ht 67.0 in | Wt 161.0 lb

## 2014-09-23 DIAGNOSIS — M542 Cervicalgia: Secondary | ICD-10-CM | POA: Diagnosis not present

## 2014-09-23 MED ORDER — HYDROCODONE-ACETAMINOPHEN 10-325 MG PO TABS: 0.5000 | ORAL_TABLET | Freq: Three times a day (TID) | ORAL | Status: DC | PRN

## 2014-09-23 NOTE — Progress Notes (Signed)
CC: Danny Cohen is a 49 y.o. male is here for Follow-up   Subjective: HPI:  Continued right-sided neck pain in the posterior aspect of the neck. It is present all hours of the day slightly improved with a TENS unit or with massage. Nothing else seems to make better worse, tramadol used to be beneficial however this has lost its effectiveness. Pain is not Better or worse since onset fall 2015. He still believes that he can palpate a small mass in the soft tissues of the neck however ultrasound did not find any abnormality last month. Pain is nonradiating and did not respond to Robaxin. Denies midline neck pain or any motor or sensory disturbances in his right upper extremity.  He has not been contacted by physical therapy as of yet   Review Of Systems Outlined In HPI  Past Medical History  Diagnosis Date  . Esophageal reflux disease 09/03/2011  . Chronic back pain greater than 3 months duration 09/03/2011  . Gastric ulcer due to Helicobacter pylori 60/63/0160  . Peptic ulcer due to Helicobacter pylori 10/93/2355    Past Surgical History  Procedure Laterality Date  . Laparoscopic inguinal hernia repair  04/23/2012    bilateral  . Hernia repair     Family History  Problem Relation Age of Onset  . Heart failure Mother     History   Social History  . Marital Status: Married    Spouse Name: N/A    Number of Children: N/A  . Years of Education: N/A   Occupational History  . Not on file.   Social History Main Topics  . Smoking status: Former Smoker    Quit date: 09/11/2000  . Smokeless tobacco: Not on file  . Alcohol Use: No  . Drug Use: No  . Sexual Activity: Not on file   Other Topics Concern  . Not on file   Social History Narrative     Objective: BP 105/68 mmHg  Pulse 63  Ht 5\' 7"  (1.702 m)  Wt 161 lb (73.029 kg)  BMI 25.21 kg/m2  General: Alert and Oriented, No Acute Distress HEENT: Pupils equal, round, reactive to light. Conjunctivae clear.  Moist mucous  membranes  Neck: No midline spinous process tenderness in the cervical spine, no pain with palpation of paraspinal musculature of the cervical spine, no palpable masses in the posterior neck today. Lungs: Clear and comfortable work of breathing Cardiac: Regular rate and rhythm.  Mental Status: No depression, anxiety, nor agitation. Skin: Warm and dry.  Assessment & Plan: Danny Cohen was seen today for follow-up.  Diagnoses and associated orders for this visit:  Neck pain on right side - HYDROcodone-acetaminophen (NORCO) 10-325 MG per tablet; Take 0.5-1 tablets by mouth every 8 (eight) hours as needed.    Right-sided neck pain: I still feel his pain is coming from a combination of cervical spine degenerative changes and from muscular strain. I still think he would benefit from physical therapy I have directed him to physically present to physical therapy today to schedule future visits. While waiting for his pain get better with physical therapy will begin hydrocodone, he understand that this is not the solution to resolving his pain.   Return if symptoms worsen or fail to improve.

## 2014-10-05 ENCOUNTER — Ambulatory Visit: Payer: BLUE CROSS/BLUE SHIELD | Admitting: Physical Therapy

## 2014-10-08 ENCOUNTER — Ambulatory Visit (INDEPENDENT_AMBULATORY_CARE_PROVIDER_SITE_OTHER): Payer: BLUE CROSS/BLUE SHIELD | Admitting: Physical Therapy

## 2014-10-08 DIAGNOSIS — M2560 Stiffness of unspecified joint, not elsewhere classified: Secondary | ICD-10-CM

## 2014-10-08 DIAGNOSIS — M47812 Spondylosis without myelopathy or radiculopathy, cervical region: Secondary | ICD-10-CM

## 2014-10-08 DIAGNOSIS — M542 Cervicalgia: Secondary | ICD-10-CM

## 2014-10-14 ENCOUNTER — Encounter (INDEPENDENT_AMBULATORY_CARE_PROVIDER_SITE_OTHER): Payer: BLUE CROSS/BLUE SHIELD | Admitting: Physical Therapy

## 2014-10-14 DIAGNOSIS — M47812 Spondylosis without myelopathy or radiculopathy, cervical region: Secondary | ICD-10-CM

## 2014-10-14 DIAGNOSIS — M2560 Stiffness of unspecified joint, not elsewhere classified: Secondary | ICD-10-CM

## 2014-10-14 DIAGNOSIS — M542 Cervicalgia: Secondary | ICD-10-CM

## 2014-10-16 ENCOUNTER — Encounter (INDEPENDENT_AMBULATORY_CARE_PROVIDER_SITE_OTHER): Payer: BLUE CROSS/BLUE SHIELD | Admitting: Physical Therapy

## 2014-10-16 DIAGNOSIS — M2560 Stiffness of unspecified joint, not elsewhere classified: Secondary | ICD-10-CM | POA: Diagnosis not present

## 2014-10-16 DIAGNOSIS — M542 Cervicalgia: Secondary | ICD-10-CM

## 2014-10-16 DIAGNOSIS — M47812 Spondylosis without myelopathy or radiculopathy, cervical region: Secondary | ICD-10-CM

## 2014-10-21 ENCOUNTER — Encounter (INDEPENDENT_AMBULATORY_CARE_PROVIDER_SITE_OTHER): Payer: BLUE CROSS/BLUE SHIELD | Admitting: Physical Therapy

## 2014-10-21 DIAGNOSIS — M2560 Stiffness of unspecified joint, not elsewhere classified: Secondary | ICD-10-CM | POA: Diagnosis not present

## 2014-10-21 DIAGNOSIS — M47812 Spondylosis without myelopathy or radiculopathy, cervical region: Secondary | ICD-10-CM

## 2014-10-21 DIAGNOSIS — M542 Cervicalgia: Secondary | ICD-10-CM

## 2014-10-23 ENCOUNTER — Encounter (INDEPENDENT_AMBULATORY_CARE_PROVIDER_SITE_OTHER): Payer: BLUE CROSS/BLUE SHIELD | Admitting: Physical Therapy

## 2014-10-27 ENCOUNTER — Ambulatory Visit (INDEPENDENT_AMBULATORY_CARE_PROVIDER_SITE_OTHER): Payer: BLUE CROSS/BLUE SHIELD | Admitting: Physical Therapy

## 2014-10-27 DIAGNOSIS — M2560 Stiffness of unspecified joint, not elsewhere classified: Secondary | ICD-10-CM

## 2014-10-27 DIAGNOSIS — M542 Cervicalgia: Secondary | ICD-10-CM

## 2014-10-27 NOTE — Therapy (Signed)
[facility] [place], [place], [address] Phone: [phone]   Fax:  [phone]  Physical Therapy Treatment  Patient Details  Name: Danny Cohen. Danny Cohen MRN: [id number] Date of Birth: 1966/01/16 Referring Provider:  Marcial Pacas, DO  Encounter Date: 10/27/2014      PT End of Session - 10/27/14 0858    Visit Number 6   Number of Visits 8   Date for PT Re-Evaluation 11/05/14   PT Start Time 0759   PT Stop Time 0848   PT Time Calculation (min) 49 min   Activity Tolerance Patient tolerated treatment well      Past Medical History  Diagnosis Date  . Esophageal reflux disease 09/03/2011  . Chronic back pain greater than 3 months duration 09/03/2011  . Gastric ulcer due to Helicobacter pylori 25/85/2778  . Peptic ulcer due to Helicobacter pylori 24/23/5361    Past Surgical History  Procedure Laterality Date  . Laparoscopic inguinal hernia repair  04/23/2012    bilateral  . Hernia repair      There were no vitals taken for this visit.  Visit Diagnosis:  Stiffness of joints, multiple sites  Neck pain      Subjective Assessment - 10/27/14 0849    Symptoms tight neck bilateral   Currently in Pain? 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   Pain Relieving Factors stretching, meds, heat   Effect of Pain on Daily Activities sore, tight at work   Multiple Pain Sites No                    [facility] Adult PT Treatment/Exercise - 10/27/14 0001    Exercises   Exercises Neck;Shoulder   Neck Exercises: Machines for Strengthening   UBE (Upper Arm Bike) level 2 with 2 min f and 2 min backward   Neck Exercises: Theraband   Scapula Retraction Red   Neck Exercises: Standing    Other Standing Exercises Levator scapular stretch boht sides in standing with shoulder flexiion   Shoulder Exercises: Standing   Protraction Strengthening;Both;15 reps;Other (comment)  at wall Serratus punch   Manual Therapy   Manual Therapy Passive ROM;Myofascial release  TPR bilateral errector spinae   Myofascial Release bilateral pects   Neck Exercises: Stretches   Upper Trapezius Stretch 2 reps;60 seconds   Chest Stretch 3 reps;2 reps;60 seconds  bilatera                     PT Long Term Goals - 10/27/14 4431    PT LONG TERM GOAL #1   Title demonstrate and / or verbalize to reduce the risk of re-injury to include inor on posture, body mechanics   PT LONG TERM GOAL #2   Title be iindependent with advaced HEP   PT LONG TERM GOAL #3   Title report pain decrease to allow 50% or more improvement in sleeping   PT LONG TERM GOAL #4   Title report minimal to no symptoms into Lr UE   PT LONG TERM GOAL #5   Title improve FOTO -/< 34% limited   Additional Long Term Goals   Additional Long Term Goals Yes   PT  LONG TERM GOAL #6   Title work using Banker without difficulty or increased symptoms               Plan - 10/27/14 0901    Clinical Impression Statement patient did well with minimal pain during treatment with a 0/10 pain scale after treatment.    Pt will benefit from skilled therapeutic intervention in order to improve on the following deficits Pain   Rehab Potential Excellent   PT Frequency 2x / week   PT Duration 4 weeks   PT Treatment/Interventions Moist Heat;Cryotherapy;Electrical Stimulation;Manual techniques;Ultrasound;Traction;Therapeutic exercise;Passive range of motion   PT Next Visit Plan continue with ROM and strength and decreasing pain        Problem List Patient Active Problem List   Diagnosis Date Noted  . Cervical osteoarthritis 09/09/2014  . Hyperglycemia 08/26/2014  . Hyperkalemia 06/25/2014  . Abnormal CT scan, small  bowel 04/13/2014  . Liver hemangioma 12/16/2012  . Urinary frequency 09/02/2012  . Bilateral inguinal hernia (BIH) 03/25/2012  . Esophageal reflux disease 09/03/2011  . Gastric ulcer due to Helicobacter pylori 75/30/0511  . Chronic back pain greater than 3 months duration 09/03/2011  . Neck pain, chronic 07/06/2011    Natividad Brood, PTA 10/27/2014, 9:20 AM  [facility] [place], [place], [address] Phone: [phone]   Fax:  [phone]

## 2014-10-27 NOTE — Patient Instructions (Signed)
Patient continues with Scapula strenthening in prone, for scap retraction Hooklying Sash and horizontal abduction using green band Neck retraction Scapular retraction

## 2014-10-30 ENCOUNTER — Encounter: Payer: Self-pay | Admitting: Physical Therapy

## 2014-10-30 ENCOUNTER — Ambulatory Visit (INDEPENDENT_AMBULATORY_CARE_PROVIDER_SITE_OTHER): Payer: BLUE CROSS/BLUE SHIELD | Admitting: Physical Therapy

## 2014-10-30 DIAGNOSIS — M2560 Stiffness of unspecified joint, not elsewhere classified: Secondary | ICD-10-CM

## 2014-10-30 DIAGNOSIS — M542 Cervicalgia: Secondary | ICD-10-CM

## 2014-10-30 NOTE — Therapy (Signed)
[place], [place], [address] Phone: [phone]   Fax:  [phone]  Physical Therapy Treatment  Patient Details  Name: Danny Cohen. Cline MRN: [id number] Date of Birth: [birth year] Referring Provider:  Marcial Pacas, DO  Encounter Date: 10/30/2014      PT End of Session - 10/30/14 0845    Visit Number 7   Number of Visits 8   Date for PT Re-Evaluation 11/05/14   PT Start Time 0845   PT Stop Time 0924   PT Time Calculation (min) 39 min   Activity Tolerance Patient tolerated treatment well      Past Medical History  Diagnosis Date  . Esophageal reflux disease 09/03/2011  . Chronic back pain greater than 3 months duration 09/03/2011  . Gastric ulcer due to Helicobacter pylori 02/40/9735  . Peptic ulcer due to Helicobacter pylori 32/99/2426    Past Surgical History  Procedure Laterality Date  . Laparoscopic inguinal hernia repair  04/23/2012    bilateral  . Hernia repair      There were no vitals taken for this visit.  Visit Diagnosis:  Neck pain  Stiffness of joints, multiple sites      Subjective Assessment - 10/30/14 0850    Symptoms Feel pretty good today.  Yesterday I felt not too bad.  I dialed back my exercises at home.    Currently in Pain? 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    Pain Relieving Factors Stretching, meds, heat    Multiple Pain Sites No           [facility] Adult PT Treatment/Exercise - 10/30/14 0001    Exercises   Exercises Neck;Shoulder   Neck Exercises: Machines for Strengthening   UBE (Upper Arm Bike) Level 3, 2 min each direction standing   Neck Exercises: Theraband   Shoulder Extension 10 reps;Green  2 sets    Rows 10 reps;Green  2 sets, elbows up.    Neck Exercises: Standing   Upper Extremity D1 Flexion;10  reps;Theraband;both sides    Neck Exercises: Prone   Other Prone Exercise Quadruped   Opp arm and leg extension x 12 Plank (modified) - hands on low plinth -hand/foot side stepping x 7 ft x 3    Other Prone Exercise Quadruped  Cervical flex to neutral, cervical diagonals x 10 each   Shoulder Exercises: Stretch   Other Shoulder Stretches Pec stretch   30 sec, 3 reps each arm. (various angles), shoulder ext stretch (hands laced behind back) 30 sec x 2            PT Education - 10/30/14 0926    Education provided Yes   Education Details HEP- added doorway pec stretch (at various angles)   Person(s) Educated Patient   Methods Explanation;Demonstration   Comprehension Verbalized understanding;Returned demonstration           PT Long Term Goals - 10/30/14 0930    PT LONG TERM GOAL #1   Title demonstrate and / or verbalize to reduce the risk of re-injury to include information on posture, body mechanics   Time 4   Period Weeks   Status Achieved   PT LONG TERM GOAL #2   Title be independent with advaced HEP   Time 4   Period Weeks  Status On-going   PT LONG TERM GOAL #3   Title report pain decrease to allow 50% or more improvement in sleeping   Time 4   Period Weeks   Status On-going   PT LONG TERM GOAL #4   Title report minimal to no symptoms into  LUE   Time 4   Period Weeks   Status On-going   PT LONG TERM GOAL #5   Title improve FOTO -/< 34% limited   Time 4   Status On-going   PT LONG TERM GOAL #6   Title work using Banker without difficulty or increased symptoms   Time 4   Period Weeks   Status On-going           Plan - 10/30/14 0927    Clinical Impression Statement Pt tolerated new exercises well; no increase in symptoms and reported 0/10 pain after treatment.  Pt reported he is still having occasional symptoms in LUE, depending on his work load.    PT Frequency 2x / week   PT Duration 4 weeks   PT Next Visit Plan Continue progressive  strengthening of cervical and UE.    Consulted and Agree with Plan of Care Patient        Problem List Patient Active Problem List   Diagnosis Date Noted  . Cervical osteoarthritis 09/09/2014  . Hyperglycemia 08/26/2014  . Hyperkalemia 06/25/2014  . Abnormal CT scan, small bowel 04/13/2014  . Liver hemangioma 12/16/2012  . Urinary frequency 09/02/2012  . Bilateral inguinal hernia (BIH) 03/25/2012  . Esophageal reflux disease 09/03/2011  . Gastric ulcer due to Helicobacter pylori 71/16/5790  . Chronic back pain greater than 3 months duration 09/03/2011  . Neck pain, chronic 07/06/2011   Kerin Perna, PTA 10/30/2014 9:37 AM   [facility] [place], [place], [address] Phone: [phone]   Fax:  [phone]

## 2014-11-02 ENCOUNTER — Ambulatory Visit (INDEPENDENT_AMBULATORY_CARE_PROVIDER_SITE_OTHER): Payer: BLUE CROSS/BLUE SHIELD | Admitting: Physical Therapy

## 2014-11-02 DIAGNOSIS — M542 Cervicalgia: Secondary | ICD-10-CM

## 2014-11-02 DIAGNOSIS — M2560 Stiffness of unspecified joint, not elsewhere classified: Secondary | ICD-10-CM

## 2014-11-02 NOTE — Therapy (Signed)
[place], [place], [address] Phone: [phone]   Fax:  [phone]  Physical Therapy Treatment  Patient Details  Name: Danny Cohen. Run[place] MRN: [id number] Date of Birth: 1966-06-07 Referring Provider:  Marcial Pacas, DO  Encounter Date: 11/02/2014      PT End of Session - 11/02/14 0715    Visit Number 8   Number of Visits 8   Date for PT Re-Evaluation 11/05/14   PT Start Time 0700   PT Stop Time 0746   PT Time Calculation (min) 46 min   Activity Tolerance Patient tolerated treatment well  Pt reported 0/10 pain after treatment.       Past Medical History  Diagnosis Date  . Esophageal reflux disease 09/03/2011  . Chronic back pain greater than 3 months duration 09/03/2011  . Gastric ulcer due to Helicobacter pylori 16/55/3748  . Peptic ulcer due to Helicobacter pylori 27/03/8674    Past Surgical History  Procedure Laterality Date  . Laparoscopic inguinal hernia repair  04/23/2012    bilateral  . Hernia repair      There were no vitals taken for this visit.  Visit Diagnosis:  Neck pain  Stiffness of joints, multiple sites      Subjective Assessment - 11/02/14 0703    Symptoms Pain is still waking him in night, especially if laying on Rt side. Sleep has improved by 10%. Felt great after leaving here Friday, but by time arrived in [place], neck was throbbing (feels driving is contributing to neck pain).   Currently in Pain? 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    Pain Relieving Factors stretching, meds, heat            [facility] Adult PT Treatment/Exercise - 11/02/14 0001    Exercises   Exercises Neck;Shoulder   Neck Exercises: Machines for Strengthening   UBE (Upper Arm Bike) Level 4, 2 min each direction standing   Cybex Row 3 plates 2x 10   Neck Exercises: Prone   Plank --  Modified plank walks- hands on low plinth.high plank 30"   Other Prone Exercise Bilateral   T's and Y's with 1# 2x10 reps    Other Prone Exercise quadruped  opp arm/leg ext x 15, cervical flex/neutral, diagonals.    Shoulder Exercises: Stretch   Other Shoulder Stretches Pec stretch in various angles   30 sec x 4 reps each UE   Manual Therapy   Manual Therapy Myofascial release;Other (comment)  MFR to R trap, scalene    Myofascial Release R side, TPR to scalenes   cross fiber friction to R cervical paraspinals C4-7.            PT Education - 11/02/14 0936    Education provided Yes   Education Details Self care- encouraged pt to alternate tank in hands this week to assess if it decreases his pain.    Person(s) Educated Patient   Methods Explanation   Comprehension Verbalized understanding           PT Long Term Goals - 11/02/14 0755    PT LONG TERM GOAL #1   Title demonstrate and / or verbalize to reduce the risk of re-injury to include information on posture, body mechanics   Time 4   Period Weeks   Status Achieved  PT LONG TERM GOAL #2   Title be independent with advaced HEP   Time 4   Period Weeks   Status Achieved   PT LONG TERM GOAL #3   Title report pain decrease to allow 50% or more improvement in sleeping   Time 4   Period Weeks   Status On-going  reported 10% improvement.    PT LONG TERM GOAL #4   Title report minimal to no symptoms into  LUE   Time 4   Period Weeks   Status Partially Met  has reported zero symptoms in 5 days.    PT LONG TERM GOAL #5   Title improve FOTO -/< 34% limited   Time 4   Period Weeks   Status On-going   PT LONG TERM GOAL #6   Title work using Banker without difficulty or increased symptoms   Time 4   Period Weeks   Status On-going               Plan - 11/02/14 5053    Clinical Impression Statement Pt tolerated new exercise (plank) and increased resistance with T's and  Y's without increase in symptoms.  Pt reported decrease in pain to 0/10 after treatment. Has met LTG # 2, partially met other goals. Progressing well. Reported 0/10 after treatment.   Rehab Potential Excellent   PT Frequency 2x / week   PT Duration 4 weeks   PT Next Visit Plan Continue progressive strengthening of cervical and UE. Reassess next visit (end of plan of care).         Problem List Patient Active Problem List   Diagnosis Date Noted  . Cervical osteoarthritis 09/09/2014  . Hyperglycemia 08/26/2014  . Hyperkalemia 06/25/2014  . Abnormal CT scan, small bowel 04/13/2014  . Liver hemangioma 12/16/2012  . Urinary frequency 09/02/2012  . Bilateral inguinal hernia (BIH) 03/25/2012  . Esophageal reflux disease 09/03/2011  . Gastric ulcer due to Helicobacter pylori 97/67/3419  . Chronic back pain greater than 3 months duration 09/03/2011  . Neck pain, chronic 07/06/2011    Kerin Perna, PTA 11/02/2014 9:38 AM    [facility] [place], [place], [address] Phone: [phone]   Fax:  [phone]

## 2014-11-06 ENCOUNTER — Ambulatory Visit (INDEPENDENT_AMBULATORY_CARE_PROVIDER_SITE_OTHER): Payer: BLUE CROSS/BLUE SHIELD | Admitting: Physical Therapy

## 2014-11-06 DIAGNOSIS — M2560 Stiffness of unspecified joint, not elsewhere classified: Secondary | ICD-10-CM | POA: Diagnosis not present

## 2014-11-06 DIAGNOSIS — M542 Cervicalgia: Secondary | ICD-10-CM

## 2014-11-06 NOTE — Therapy (Signed)
[place], [place], [address] Phone: [phone]   Fax:  [phone]  Physical Therapy Treatment  Patient Details  Name: Danny Cohen MRN: [id number] Date of Birth: 1965/09/24 Referring Provider:  Marcial Pacas, DO  Encounter Date: 11/06/2014      PT End of Session - 11/06/14 1459    Visit Number 9   Date for PT Re-Evaluation 11/05/14   PT Start Time 1438   PT Stop Time 1538   PT Time Calculation (min) 60 min   Activity Tolerance Patient tolerated treatment well      Past Medical History  Diagnosis Date  . Esophageal reflux disease 09/03/2011  . Chronic back pain greater than 3 months duration 09/03/2011  . Gastric ulcer due to Helicobacter pylori 38/33/3832  . Peptic ulcer due to Helicobacter pylori 91/91/6606    Past Surgical History  Procedure Laterality Date  . Laparoscopic inguinal hernia repair  04/23/2012    bilateral  . Hernia repair      There were no vitals taken for this visit.  Visit Diagnosis:  Neck pain  Stiffness of joints, multiple sites      Subjective Assessment - 11/06/14 1445    Symptoms Felt great leaving here last treatment, but again by time arrived in [place] was having throbbing pain in neck. Pain has lasted for 3 days now. "I don't know why the pain keeps coming back, still having trouble sleeping, too".  Any side bending in neck causes tingling in L hand and increased neck pain.    Limitations --  driving    Currently in Pain? 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    Pain Relieving Factors stretching, meds, heat            [facility] Adult PT Treatment/Exercise - 11/06/14 0001    Exercises   Exercises Neck;Shoulder   Neck Exercises: Machines for Strengthening   UBE (Upper Arm Bike) Level 4 standing, 2 min each way    Neck Exercises: Standing   Upper Extremity D1 Flexion;Extension;10 reps;Theraband   Theraband Level (UE D1) Level 3 (Green)   Lift / Chop Theraband  8 reps, reverse chop    Theraband Level (Lift/Chop) Level 3 (Green)   Neck Exercises: Prone   Other Prone Exercise POE, cervical flex/neutral, cervical diagonals x 10 each   Other Prone Exercise quadruped opp arm/leg  ext x 12    Shoulder Exercises: Stretch   Other Shoulder Stretches Pec stretch in various angles   30 sec x 4 reps each UE   Modalities   Modalities Electrical Stimulation;Moist Heat   Moist Heat Therapy   Number Minutes Moist Heat 15 Minutes   Moist Heat Location Other (comment)  neck   Electrical Stimulation   Electrical Stimulation Location upper cervical paraspinals and levator insertion B   Electrical Stimulation Action IFC   Electrical Stimulation Parameters 80/150 Hz x 15 min    Electrical Stimulation Goals Pain            PT Education - 11/06/14 1534    Education provided Yes   Education Details self care- encourage pt to be mindful of his everyday posture (no forward head when not  needed) and alternate sleep position and try towel roll to fill void.    Person(s) Educated Patient   Methods Explanation   Comprehension Verbalized understanding             PT Long Term Goals - 11/02/14 0755    PT LONG TERM GOAL #1   Title demonstrate and / or verbalize to reduce the risk of re-injury to include information on posture, body mechanics   Time 4   Period Weeks   Status Achieved   PT LONG TERM GOAL #2   Title be independent with advaced HEP   Time 4   Period Weeks   Status Achieved   PT LONG TERM GOAL #3   Title report pain decrease to allow 50% or more improvement in sleeping   Time 4   Period Weeks   Status On-going  reported 10% improvement.    PT LONG TERM GOAL #4   Title report minimal to  no symptoms into  LUE   Time 4   Period Weeks   Status Partially Met  has reported zero symptoms in 5 days.    PT LONG TERM GOAL #5   Title improve FOTO -/< 34% limited   Time 4   Period Weeks   Status On-going   PT LONG TERM GOAL #6   Title work using Banker without difficulty or increased symptoms   Time 4   Period Weeks   Status On-going           Plan - 11/06/14 1528    Clinical Impression Statement Pt presented with increased pain in neck and return of symptoms in LUE (after no symptoms in UE for 2 wks).  Pt reported decreased pain from 8/10 to 3/10 with therapeutic exercise and further decrease in pain after modalities. Pt has met no new goals this session.     PT Treatment/Interventions Moist Heat;Cryotherapy;Electrical Stimulation;Manual techniques;Ultrasound;Traction;Therapeutic exercise;Passive range of motion   PT Next Visit Plan Discussed with supervising PT.  Pt interested in continuing therapy to decrease pain & symptoms and increase activity tolerance with work positions.    Consulted and Agree with Plan of Care Patient        Problem List Patient Active Problem List   Diagnosis Date Noted  . Cervical osteoarthritis 09/09/2014  . Hyperglycemia 08/26/2014  . Hyperkalemia 06/25/2014  . Abnormal CT scan, small bowel 04/13/2014  . Liver hemangioma 12/16/2012  . Urinary frequency 09/02/2012  . Bilateral inguinal hernia (BIH) 03/25/2012  . Esophageal reflux disease 09/03/2011  . Gastric ulcer due to Helicobacter pylori 76/16/0737  . Chronic back pain greater than 3 months duration 09/03/2011  . Neck pain, chronic 07/06/2011    Kerin Perna, PTA 11/06/2014 3:46 PM  [facility] [place], [place], [address] Phone: [phone]   Fax:  [phone]

## 2014-11-09 NOTE — Addendum Note (Signed)
Addended by: Braxley Balandran E on: 11/09/2014 10:58 AM   Modules accepted: Orders

## 2014-11-10 ENCOUNTER — Ambulatory Visit (INDEPENDENT_AMBULATORY_CARE_PROVIDER_SITE_OTHER): Payer: BLUE CROSS/BLUE SHIELD | Admitting: Physical Therapy

## 2014-11-10 DIAGNOSIS — M2560 Stiffness of unspecified joint, not elsewhere classified: Secondary | ICD-10-CM | POA: Diagnosis not present

## 2014-11-10 DIAGNOSIS — M542 Cervicalgia: Secondary | ICD-10-CM

## 2014-11-10 NOTE — Therapy (Signed)
[place], [place], [address] Phone: [phone]   Fax:  [phone]  Physical Therapy Treatment  Patient Details  Name: Danny Cohen. Danny Cohen MRN: [id number] Date of Birth: 1966/08/01 Referring Provider:  Marcial Pacas, DO  Encounter Date: 11/10/2014      PT End of Session - 11/10/14 0923    Visit Number 10   Number of Visits 16   Date for PT Re-Evaluation 12/04/14   PT Start Time 0923   PT Stop Time 1001   PT Time Calculation (min) 38 min   Activity Tolerance Patient tolerated treatment well      Past Medical History  Diagnosis Date  . Esophageal reflux disease 09/03/2011  . Chronic back pain greater than 3 months duration 09/03/2011  . Gastric ulcer due to Helicobacter pylori 73/71/0626  . Peptic ulcer due to Helicobacter pylori 94/85/4627    Past Surgical History  Procedure Laterality Date  . Laparoscopic inguinal hernia repair  04/23/2012    bilateral  . Hernia repair      There were no vitals taken for this visit.  Visit Diagnosis:  Stiffness of joints, multiple sites  Neck pain      Subjective Assessment - 11/10/14 0924    Symptoms worked out last night at gym; no pain afterwards - just achy.  Has not had tingling in hand since last visit. Still working out the pillow situation at night.    Currently in Pain? 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    Pain Relieving Factors stretching, heat, medication                     [facility] Adult PT Treatment/Exercise - 11/10/14 0001    Exercises   Exercises Neck;Shoulder   Neck Exercises: Machines for Strengthening   UBE (Upper Arm Bike) level 4, 2 min each way, standing    Neck Exercises: Prone   W Back 10 reps;Weight  2 sets, challenging   W Back Weights (lbs) 1#   Other Prone Exercise  Superman with 1#, 1 set of 10   Shoulder Exercises: Prone   Other Prone Exercises Planks: low to high R/L x 4 reps leading with each arm x 2 sets; plank up and down from 3" step x 8 reps (both exercises very challenging)    Shoulder Exercises: Stretch   Other Shoulder Stretches Childs pose, with and without lat trunk flex 30 sec x 3   Shoulder Exercises: Body Blade   Flexion 30 seconds;2 reps (1 rep each arm, ABD 30 sec and 1 rep each side.    Manual Therapy   Manual Therapy Other (comment)   Other Manual Therapy Dynamic tape applied to cervical-thoracic paraspinals for postural correction cues. (Bilateral tape)   Neck Exercises: Stretches   Chest Stretch 2 reps;30 seconds   Other Neck Stretches POE, cervical flex/neutral, diagonals x 5 each.                      PT Long Term Goals - 11/02/14 0755    PT LONG TERM GOAL #1   Title demonstrate and / or verbalize to reduce the risk of re-injury to include information on posture, body mechanics   Time 4   Period Weeks   Status Achieved  PT LONG TERM GOAL #2   Title be independent with advaced HEP   Time 4   Period Weeks   Status Achieved   PT LONG TERM GOAL #3   Title report pain decrease to allow 50% or more improvement in sleeping   Time 4   Period Weeks   Status On-going  reported 10% improvement.    PT LONG TERM GOAL #4   Title report minimal to no symptoms into  LUE   Time 4   Period Weeks   Status Partially Met  has reported zero symptoms in 5 days.    PT LONG TERM GOAL #5   Title improve FOTO -/< 34% limited   Time 4   Period Weeks   Status On-going   PT LONG TERM GOAL #6   Title work using Banker without difficulty or increased symptoms   Time 4   Period Weeks   Status On-going               Plan - 11/10/14 1004    Clinical Impression Statement Pt tolerated new exercises with increased difficulty and resistance with minimal increase in pain in neck. No modalities used due to minimal  symptoms post ex. Making progress towards goals with less reported symptoms and slight improve in sleep.    PT Frequency 1x / week   PT Duration 4 weeks   PT Treatment/Interventions Moist Heat;Cryotherapy;Electrical Stimulation;Manual techniques;Ultrasound;Traction;Therapeutic exercise;Passive range of motion   PT Next Visit Plan Continue progressive neck/shoulder strengthening.  Reassess dynamic tape effectiveness for postural correction.    Consulted and Agree with Plan of Care Patient        Problem List Patient Active Problem List   Diagnosis Date Noted  . Cervical osteoarthritis 09/09/2014  . Hyperglycemia 08/26/2014  . Hyperkalemia 06/25/2014  . Abnormal CT scan, small bowel 04/13/2014  . Liver hemangioma 12/16/2012  . Urinary frequency 09/02/2012  . Bilateral inguinal hernia (BIH) 03/25/2012  . Esophageal reflux disease 09/03/2011  . Gastric ulcer due to Helicobacter pylori 27/11/5007  . Chronic back pain greater than 3 months duration 09/03/2011  . Neck pain, chronic 07/06/2011    Kerin Perna, PTA 11/10/2014 10:12 AM  [place], [place], [address] Phone: [phone]   Fax:  [phone]

## 2014-11-13 ENCOUNTER — Ambulatory Visit (INDEPENDENT_AMBULATORY_CARE_PROVIDER_SITE_OTHER): Payer: BLUE CROSS/BLUE SHIELD | Admitting: Physical Therapy

## 2014-11-13 DIAGNOSIS — M2560 Stiffness of unspecified joint, not elsewhere classified: Secondary | ICD-10-CM | POA: Diagnosis not present

## 2014-11-13 DIAGNOSIS — M542 Cervicalgia: Secondary | ICD-10-CM

## 2014-11-13 NOTE — Patient Instructions (Signed)
Scapular: Stabilization (Prone)   Holding _2-3 ___ pound weights, raise both arms out from sides. Keep elbows straight. Repeat ___10_ times per set. Do 3____ sets per session. Do ____ sessions per day.  http://orth.exer.us/858   Copyright  VHI. All rights reserved.

## 2014-11-13 NOTE — Therapy (Signed)
[place], [place], [address] Phone: [phone]   Fax:  [phone]  Physical Therapy Treatment  Patient Details  Name: Danny Attwood. Cohen MRN: [id number] Date of Birth: Sep 25, 1965 Referring Provider:  Marcial Pacas, DO  Encounter Date: 11/13/2014      PT End of Session - 11/13/14 1434    Visit Number 11   Number of Visits 16   Date for PT Re-Evaluation 12/04/14   PT Start Time 0230   PT Stop Time 0305   PT Time Calculation (min) 35 min   Activity Tolerance Patient tolerated treatment well      Past Medical History  Diagnosis Date  . Esophageal reflux disease 09/03/2011  . Chronic back pain greater than 3 months duration 09/03/2011  . Gastric ulcer due to Helicobacter pylori 17/51/0258  . Peptic ulcer due to Helicobacter pylori 52/77/8242    Past Surgical History  Procedure Laterality Date  . Laparoscopic inguinal hernia repair  04/23/2012    bilateral  . Hernia repair      There were no vitals taken for this visit.  Visit Diagnosis:  Stiffness of joints, multiple sites  Neck pain      Subjective Assessment - 11/13/14 1431    Symptoms sore neck. no changes with work. tossed and turned alot last night.    Currently in Pain? No/denies  just sore          [facility] PT Assessment - 11/13/14 0001    ROM / Strength   AROM / PROM / Strength AROM;Strength   AROM   AROM Assessment Site Cervical   Cervical - Right Rotation 76   Cervical - Left Rotation 75   Strength   Strength Assessment Site Other (comment)  mid/low traps R/L. L 5/5, R 4=/5 for both                  [facility] Adult PT Treatment/Exercise - 11/13/14 0001    Shoulder Exercises: Prone   Retraction Weight (lbs) 2 T's 3X 10  1lb 3X10 Y's prone right only   Shoulder Exercises: Standing   Other Standing Exercises arm swings with  eyes to  R corner 3 swings, to side 3 swings, forward 3 swings  then eyes opposite  corner 3 swings, to side 3 swings, & fwd   Shoulder Exercises: ROM/Strengthening   Rebounder a   UBE (Upper Arm Bike) level 2 X 4'                PT Education - 11/13/14 1503    Education provided Yes   Education Details T's and Y's with hand weights to increase as able on Right only. Handout given.   Person(s) Educated Patient   Methods Explanation;Demonstration;Verbal cues   Comprehension Verbalized understanding;Returned demonstration             PT Long Term Goals - 11/13/14 1435    PT LONG TERM GOAL #3   Title 25% decrease    Status On-going   PT LONG TERM GOAL #5   Title 39% 11/13/14   Status On-going               Plan - 11/13/14 1505    Clinical Impression Statement pt did well with new ther ex. his pain has decreased with increased ROM in CS and freedom of movement to Lt increased after arm swings ther ex. with no pain or stiffness. 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    Pt will benefit from skilled therapeutic intervention in order to improve on the following deficits Pain   Rehab Potential Excellent   PT Frequency 1x / week   PT Duration 4 weeks   PT Treatment/Interventions Moist Heat;Cryotherapy;Electrical Stimulation;Manual techniques;Ultrasound;Traction;Therapeutic exercise;Passive range of motion   PT Next Visit Plan assess for D/C next visit if still doing well   Consulted and Agree with Plan of Care Patient        Problem List Patient Active Problem List   Diagnosis Date Noted  . Cervical osteoarthritis 09/09/2014  . Hyperglycemia 08/26/2014  . Hyperkalemia 06/25/2014  . Abnormal CT scan, small bowel 04/13/2014  . Liver hemangioma 12/16/2012  . Urinary frequency 09/02/2012  . Bilateral inguinal hernia (BIH) 03/25/2012  . Esophageal reflux disease 09/03/2011  . Gastric ulcer due to Helicobacter pylori 80/99/8338  . Chronic back pain greater  than 3 months duration 09/03/2011  . Neck pain, chronic 07/06/2011     Natividad Brood, PTA  11/13/2014, 3:14 PM  [facility] [place], [place], [address] Phone: [phone]   Fax:  [phone]

## 2014-11-16 ENCOUNTER — Encounter: Payer: BLUE CROSS/BLUE SHIELD | Admitting: Physical Therapy

## 2014-11-18 ENCOUNTER — Ambulatory Visit (INDEPENDENT_AMBULATORY_CARE_PROVIDER_SITE_OTHER): Payer: BLUE CROSS/BLUE SHIELD | Admitting: Physical Therapy

## 2014-11-18 DIAGNOSIS — M2560 Stiffness of unspecified joint, not elsewhere classified: Secondary | ICD-10-CM

## 2014-11-18 NOTE — Therapy (Signed)
[place], [place], [address] Phone: [phone]   Fax:  [phone]  Physical Therapy Treatment  Patient Details  Name: Danny Cohen. Baggerly MRN: [id number] Date of Birth: 1965/10/25 Referring Provider:  Marcial Pacas, DO  Encounter Date: 11/18/2014      PT End of Session - 11/18/14 1517    Visit Number 12   Number of Visits 16   Date for PT Re-Evaluation 12/04/14   PT Start Time 0350   PT Stop Time 1549   PT Time Calculation (min) 34 min   Activity Tolerance Patient tolerated treatment well      Past Medical History  Diagnosis Date  . Esophageal reflux disease 09/03/2011  . Chronic back pain greater than 3 months duration 09/03/2011  . Gastric ulcer due to Helicobacter pylori 09/38/1829  . Peptic ulcer due to Helicobacter pylori 93/71/6967    Past Surgical History  Procedure Laterality Date  . Laparoscopic inguinal hernia repair  04/23/2012    bilateral  . Hernia repair      There were no vitals taken for this visit.  Visit Diagnosis:  Stiffness of joints, multiple sites      Subjective Assessment - 11/18/14 1517    Symptoms No pain in neck. Changed pillow and is now sleeping through the night. Made modifications to work equipment and while driving.    Currently in Pain? No/denies                    [facility] Adult PT Treatment/Exercise - 11/18/14 0001    Exercises   Exercises Neck;Shoulder   Neck Exercises: Machines for Strengthening   UBE (Upper Arm Bike) level 4, 2 min each way. Standing on BOSU   Neck Exercises: Standing   Lift / Chop 10 reps;Theraband;Other (comment)  Yellow ball x 10. band for x 10 up/ down R/L    Theraband Level (Lift/Chop) Level 3 (Green)   Other Standing Exercises Row with ER   Shoulder Exercises: ROM/Strengthening   Plank --  High to low plank x 5 reps each side. On/off 2" step x 10   Other ROM/Strengthening Exercises Plank to down dog x 4 reps.                  PT Education - 11/18/14 1550    Education provided Yes   Education Details HEP    Person(s) Educated Patient   Methods Handout;Demonstration;Explanation   Comprehension Verbalized understanding;Returned demonstration             PT Long Term Goals - 11/18/14 1556    PT LONG TERM GOAL #1   Title demonstrate and / or verbalize to reduce the risk of re-injury to include information on posture, body mechanics   Time 4   Period Weeks   Status Achieved   PT LONG TERM GOAL #2   Time 4   Period Weeks   Status Achieved   PT LONG TERM GOAL #3   Title 50% improvement in sleep    Time 4   Status Achieved   PT LONG TERM GOAL #4   Title report minimal to no symptoms into  LUE   Time 4   Period Weeks   Status Achieved   PT LONG TERM GOAL #5   Title Improved FOTO score <34%   Time 4   Period Weeks   Status Achieved  28% scored at D/C   Additional Long Term Goals   Additional Long  Term Goals Yes   PT LONG TERM GOAL #6   Title work using Banker without difficulty or increased symptoms   Time 4   Period Weeks   Status Achieved               Plan - 11/18/14 1559    Clinical Impression Statement Pt has not had symptoms in 2 wks.  Pt has met all goals and is satisfied with current level of function.    Rehab Potential Excellent   PT Next Visit Plan Spoke to supervising PT; will D/C to HEP at this time.    Consulted and Agree with Plan of Care Patient        Problem List Patient Active Problem List   Diagnosis Date Noted  . Cervical osteoarthritis 09/09/2014  . Hyperglycemia 08/26/2014  . Hyperkalemia 06/25/2014  . Abnormal CT scan, small bowel 04/13/2014  . Liver hemangioma 12/16/2012  . Urinary frequency 09/02/2012  . Bilateral inguinal hernia (BIH) 03/25/2012  . Esophageal reflux disease 09/03/2011  . Gastric ulcer due to Helicobacter pylori 90/22/8406  . Chronic back pain greater than 3 months duration 09/03/2011  . Neck  pain, chronic 07/06/2011   PHYSICAL THERAPY DISCHARGE SUMMARY  Visits from Start of Care: 12  Current functional level related to goals / functional outcomes: Patient back to baseline   Remaining deficits: none  Education / Equipment: HEP Plan: Patient agrees to discharge.  Patient goals were met. Patient is being discharged due to meeting the stated rehab goals.  ?????     Jeral Pinch, PT 11/18/2014, 4:04 PM  [facility] [place], [place], [address] Phone: [phone]   Fax:  [phone]

## 2014-11-18 NOTE — Therapy (Signed)
[place], [place], [address] Phone: [phone]   Fax:  [phone]  Physical Therapy Treatment  Patient Details  Name: Danny Cohen MRN: [id number] Date of Birth: [birth year] Referring Provider:  Marcial Pacas, DO  Encounter Date: 11/18/2014      PT End of Session - 11/18/14 1517    Visit Number 12   Number of Visits 16   Date for PT Re-Evaluation 12/04/14   PT Start Time 6122   PT Stop Time 1549   PT Time Calculation (min) 34 min   Activity Tolerance Patient tolerated treatment well      Past Medical History  Diagnosis Date  . Esophageal reflux disease 09/03/2011  . Chronic back pain greater than 3 months duration 09/03/2011  . Gastric ulcer due to Helicobacter pylori 44/97/5300  . Peptic ulcer due to Helicobacter pylori 51/06/2110    Past Surgical History  Procedure Laterality Date  . Laparoscopic inguinal hernia repair  04/23/2012    bilateral  . Hernia repair      There were no vitals taken for this visit.  Visit Diagnosis:  Stiffness of joints, multiple sites      Subjective Assessment - 11/18/14 1517    Symptoms No pain in neck. Changed pillow and is now sleeping through the night. Made modifications to work equipment and while driving.    Currently in Pain? No/denies                    [facility] Adult PT Treatment/Exercise - 11/18/14 0001    Exercises   Exercises Neck;Shoulder   Neck Exercises: Machines for Strengthening   UBE (Upper Arm Bike) level 4, 2 min each way. Standing on BOSU   Neck Exercises: Standing   Lift / Chop 10 reps;Theraband;Other (comment)  Yellow ball x 10. band for x 10 up/ down R/L    Theraband Level (Lift/Chop) Level 3 (Green)   Other Standing Exercises Row with ER x 15, Lat pull down x 15 with green band.    Shoulder Exercises: ROM/Strengthening   Plank --  High to low plank x 5 reps each side. On/off 2" step x 10   Other  ROM/Strengthening Exercises Plank to down dog x 4 reps.                 PT Education - 11/18/14 1550    Education provided Yes   Education Details HEP    Person(s) Educated Patient   Methods Handout;Demonstration;Explanation   Comprehension Verbalized understanding;Returned demonstration             PT Long Term Goals - 11/18/14 1556    PT LONG TERM GOAL #1   Title demonstrate and / or verbalize to reduce the risk of re-injury to include information on posture, body mechanics   Time 4   Period Weeks   Status Achieved   PT LONG TERM GOAL #2   Time 4   Period Weeks   Status Achieved   PT LONG TERM GOAL #3   Title 50% improvement in sleep    Time 4   Status Achieved   PT LONG TERM GOAL #4   Title report minimal to no symptoms into  LUE   Time 4   Period Weeks   Status Achieved   PT LONG TERM GOAL #5   Title Improved FOTO score <34%   Time 4   Period Weeks   Status Achieved  28% scored at  D/C   Additional Long Term Goals   Additional Long Term Goals Yes   PT LONG TERM GOAL #6   Title work using Banker without difficulty or increased symptoms   Time 4   Period Weeks   Status Achieved               Plan - 11/18/14 1559    Clinical Impression Statement Pt has not had symptoms in 2 wks.  Pt has met all goals and is satisfied with current level of function.    Rehab Potential Excellent   PT Next Visit Plan Spoke to supervising PT; will D/C to HEP at this time.    Consulted and Agree with Plan of Care Patient        Problem List Patient Active Problem List   Diagnosis Date Noted  . Cervical osteoarthritis 09/09/2014  . Hyperglycemia 08/26/2014  . Hyperkalemia 06/25/2014  . Abnormal CT scan, small bowel 04/13/2014  . Liver hemangioma 12/16/2012  . Urinary frequency 09/02/2012  . Bilateral inguinal hernia (BIH) 03/25/2012  . Esophageal reflux disease 09/03/2011  . Gastric ulcer due to Helicobacter pylori 37/06/6268  . Chronic back  pain greater than 3 months duration 09/03/2011  . Neck pain, chronic 07/06/2011    Kerin Perna, PTA 11/18/2014 4:01 PM   [facility] [place], [place], [address] Phone: [phone]   Fax:  [phone]

## 2014-11-18 NOTE — Patient Instructions (Signed)
[employer] Copyright  VHI. All rights reserved.  Fly: Reverse   Face anchor in stride stance, reaching forward, thumbs up. Pull arms apart and back, squeezing shoulder blades together at end position. Repeat _10_ times per set. Do _2_ sets per session. Do _2-3_ sessions per week. Anchor Height: Chest  http://tub.exer.us/107   Copyright  VHI. All rights reserved.  Lat Pull Down   Face anchor with knees slightly flexed. Palms down, pull arms down to sides. Repeat 10__ times per set. Do _2_ sets per session. Do 2-3__ sessions per week. Anchor Height: Over Head  http://tub.exer.us/89   Copyright  VHI. All rights reserved.  Table to Downward Dog to [facility]   Progress smoothly from one position to the next. Keep upper back broad. Don't collapse on shoulders. In plank and table, keep base of skull lifted. Hold for 30-60____ seconds. Repeat ___5_ times.  Copyright  VHI. All rights reserved.  Plank / Walking [employer]   Hips and arms completely stable, exhale and very lightly touch knee to floor. Inhale return to plank pose. Exhale other knee very lightly to floor. Inhale, return to plank pose. Repeat _10___ times each leg. Can do high to low planks x 5-10 reps each side.   Copyright  VHI. All rights reserved.   [facility] [address] [place], [place] [address]  [phone] (office) [phone] (fax)

## 2015-02-04 ENCOUNTER — Encounter: Payer: Self-pay | Admitting: Sports Medicine

## 2015-02-04 ENCOUNTER — Ambulatory Visit (INDEPENDENT_AMBULATORY_CARE_PROVIDER_SITE_OTHER): Payer: BLUE CROSS/BLUE SHIELD

## 2015-02-04 ENCOUNTER — Ambulatory Visit (INDEPENDENT_AMBULATORY_CARE_PROVIDER_SITE_OTHER): Payer: BLUE CROSS/BLUE SHIELD | Admitting: Sports Medicine

## 2015-02-04 VITALS — BP 111/75 | HR 68 | Ht 67.0 in | Wt 167.0 lb

## 2015-02-04 DIAGNOSIS — S5001XA Contusion of right elbow, initial encounter: Secondary | ICD-10-CM | POA: Diagnosis not present

## 2015-02-04 DIAGNOSIS — M25521 Pain in right elbow: Secondary | ICD-10-CM | POA: Diagnosis not present

## 2015-02-04 MED ORDER — MELOXICAM 15 MG PO TABS: ORAL_TABLET | ORAL | Status: DC

## 2015-02-04 NOTE — Assessment & Plan Note (Addendum)
This likely represents traumatic lateral epicondylitis with worsening pain 2 weeks post injury. Meloxicam, x-rays, elbow sleeve, return in 2 weeks, injection if no better.

## 2015-02-04 NOTE — Progress Notes (Signed)
   Subjective:    I'm seeing this patient as a consultation for:  Dr. Ileene Rubens  CC: Right elbow injury  HPI: 2 weeks ago this pleasant 49 year old male had the top of a garbage can slam over the lateral aspect of his elbow, he had immediate pain, swelling, bruising, over the past 2 weeks the pain has actually worsened. Localized over the lateral epicondyle, no radiation.  Past medical history, Surgical history, Family history not pertinant except as noted below, Social history, Allergies, and medications have been entered into the medical record, reviewed, and no changes needed.   Review of Systems: No headache, visual changes, nausea, vomiting, diarrhea, constipation, dizziness, abdominal pain, skin rash, fevers, chills, night sweats, weight loss, swollen lymph nodes, body aches, joint swelling, muscle aches, chest pain, shortness of breath, mood changes, visual or auditory hallucinations.   Objective:   General: Well Developed, well nourished, and in no acute distress.  Neuro/Psych: Alert and oriented x3, extra-ocular muscles intact, able to move all 4 extremities, sensation grossly intact. Skin: Warm and dry, no rashes noted.  Respiratory: Not using accessory muscles, speaking in full sentences, trachea midline.  Cardiovascular: Pulses palpable, no extremity edema. Abdomen: Does not appear distended. Right Elbow: Visibly swollen and slightly bruised over the lateral epicondyle Range of motion full pronation, supination, flexion, extension. Strength is full to all of the above directions Stable to varus, valgus stress. Negative moving valgus stress test. No discrete areas of tenderness to palpation. Ulnar nerve does not sublux. Negative cubital tunnel Tinel's.  X-rays are negative for fracture.  Impression and Recommendations:   This case required medical decision making of moderate complexity.

## 2015-03-26 ENCOUNTER — Encounter: Payer: Self-pay | Admitting: Family Medicine

## 2015-03-26 ENCOUNTER — Ambulatory Visit (INDEPENDENT_AMBULATORY_CARE_PROVIDER_SITE_OTHER): Payer: BLUE CROSS/BLUE SHIELD | Admitting: Family Medicine

## 2015-03-26 VITALS — BP 109/68 | HR 68 | Wt 170.0 lb

## 2015-03-26 DIAGNOSIS — M7711 Lateral epicondylitis, right elbow: Secondary | ICD-10-CM | POA: Diagnosis not present

## 2015-03-26 MED ORDER — NITROGLYCERIN 0.2 MG/HR TD PT24: MEDICATED_PATCH | TRANSDERMAL | Status: DC

## 2015-03-26 NOTE — Assessment & Plan Note (Addendum)
Not better with conservative measures. Discussed options. Patient elects for trial of nitroglycerin patch protocol with home eccentric exercises. Wrist bracing as needed at work. Return in one month for recheck and reevaluation. Chronic problem exacerbation.

## 2015-03-26 NOTE — Patient Instructions (Addendum)
Thank you for coming in today. Do the exercises we discussed. Apply one quarter patch to the skin at the area of pain. Change the patch daily. Move the past around on the skin. You may have a headache at first. Use Tylenol to relieve the headache. Call if you have problems.  Lateral Epicondylitis (Tennis Elbow) with Rehab Lateral epicondylitis involves inflammation and pain around the outer portion of the elbow. The pain is caused by inflammation of the tendons in the forearm that bring back (extend) the wrist. Lateral epicondylitis is also called tennis elbow, because it is very common in tennis players. However, it may occur in any individual who extends the wrist repetitively. If lateral epicondylitis is left untreated, it may become a chronic problem. SYMPTOMS  1. Pain, tenderness, and inflammation on the outer (lateral) side of the elbow. 2. Pain or weakness with gripping activities. 3. Pain that increases with wrist-twisting motions (playing tennis, using a screwdriver, opening a door or a jar). 4. Pain with lifting objects, including a coffee cup. CAUSES  Lateral epicondylitis is caused by inflammation of the tendons that extend the wrist. Causes of injury may include:  Repetitive stress and strain on the muscles and tendons that extend the wrist.  Sudden change in activity level or intensity.  Incorrect grip in racquet sports.  Incorrect grip size of racquet (often too large).  Incorrect hitting position or technique (usually backhand, leading with the elbow).  Using a racket that is too heavy. RISK INCREASES WITH:  Sports or occupations that require repetitive and/or strenuous forearm and wrist movements (tennis, squash, racquetball, carpentry).  Poor wrist and forearm strength and flexibility.  Failure to warm up properly before activity.  Resuming activity before healing, rehabilitation, and conditioning are complete. PREVENTION   Warm up and stretch properly  before activity.  Maintain physical fitness:  Strength, flexibility, and endurance.  Cardiovascular fitness.  Wear and use properly fitted equipment.  Learn and use proper technique and have a coach correct improper technique.  Wear a tennis elbow (counterforce) brace. PROGNOSIS  The course of this condition depends on the degree of the injury. If treated properly, acute cases (symptoms lasting less than 4 weeks) are often resolved in 2 to 6 weeks. Chronic (longer lasting cases) often resolve in 3 to 6 months but may require physical therapy. RELATED COMPLICATIONS   Frequently recurring symptoms, resulting in a chronic problem. Properly treating the problem the first time decreases frequency of recurrence.  Chronic inflammation, scarring tendon degeneration, and partial tendon tear, requiring surgery.  Delayed healing or resolution of symptoms. TREATMENT  Treatment first involves the use of ice and medicine to reduce pain and inflammation. Strengthening and stretching exercises may help reduce discomfort if performed regularly. These exercises may be performed at home if the condition is an acute injury. Chronic cases may require a referral to a physical therapist for evaluation and treatment. Your caregiver may advise a corticosteroid injection to help reduce inflammation. Rarely, surgery is needed. MEDICATION  If pain medicine is needed, nonsteroidal anti-inflammatory medicines (aspirin and ibuprofen), or other minor pain relievers (acetaminophen), are often advised.  Do not take pain medicine for 7 days before surgery.  Prescription pain relievers may be given, if your caregiver thinks they are needed. Use only as directed and only as much as you need.  Corticosteroid injections may be recommended. These injections should be reserved only for the most severe cases, because they can only be given a certain number of times. HEAT AND  COLD  Cold treatment (icing) should be applied  for 10 to 15 minutes every 2 to 3 hours for inflammation and pain, and immediately after activity that aggravates your symptoms. Use ice packs or an ice massage.  Heat treatment may be used before performing stretching and strengthening activities prescribed by your caregiver, physical therapist, or athletic trainer. Use a heat pack or a warm water soak. SEEK MEDICAL CARE IF: Symptoms get worse or do not improve in 2 weeks, despite treatment. EXERCISES  RANGE OF MOTION (ROM) AND STRETCHING EXERCISES - Epicondylitis, Lateral (Tennis Elbow) These exercises may help you when beginning to rehabilitate your injury. Your symptoms may go away with or without further involvement from your physician, physical therapist, or athletic trainer. While completing these exercises, remember:   Restoring tissue flexibility helps normal motion to return to the joints. This allows healthier, less painful movement and activity.  An effective stretch should be held for at least 30 seconds.  A stretch should never be painful. You should only feel a gentle lengthening or release in the stretched tissue. RANGE OF MOTION - Wrist Flexion, Active-Assisted  Extend your right / left elbow with your fingers pointing down.*  Gently pull the back of your hand towards you, until you feel a gentle stretch on the top of your forearm.  Hold this position for __________ seconds. Repeat __________ times. Complete this exercise __________ times per day.  *If directed by your physician, physical therapist or athletic trainer, complete this stretch with your elbow bent, rather than extended. RANGE OF MOTION - Wrist Extension, Active-Assisted  Extend your right / left elbow and turn your palm upwards.*  Gently pull your palm and fingertips back, so your wrist extends and your fingers point more toward the ground.  You should feel a gentle stretch on the inside of your forearm.  Hold this position for __________ seconds. Repeat  __________ times. Complete this exercise __________ times per day. *If directed by your physician, physical therapist or athletic trainer, complete this stretch with your elbow bent, rather than extended. STRETCH - Wrist Flexion  Place the back of your right / left hand on a tabletop, leaving your elbow slightly bent. Your fingers should point away from your body.  Gently press the back of your hand down onto the table by straightening your elbow. You should feel a stretch on the top of your forearm.  Hold this position for __________ seconds. Repeat __________ times. Complete this stretch __________ times per day.  STRETCH - Wrist Extension   Place your right / left fingertips on a tabletop, leaving your elbow slightly bent. Your fingers should point backwards.  Gently press your fingers and palm down onto the table by straightening your elbow. You should feel a stretch on the inside of your forearm.  Hold this position for __________ seconds. Repeat __________ times. Complete this stretch __________ times per day.  STRENGTHENING EXERCISES - Epicondylitis, Lateral (Tennis Elbow) These exercises may help you when beginning to rehabilitate your injury. They may resolve your symptoms with or without further involvement from your physician, physical therapist, or athletic trainer. While completing these exercises, remember:   Muscles can gain both the endurance and the strength needed for everyday activities through controlled exercises.  Complete these exercises as instructed by your physician, physical therapist or athletic trainer. Increase the resistance and repetitions only as guided.  You may experience muscle soreness or fatigue, but the pain or discomfort you are trying to eliminate should never worsen  during these exercises. If this pain does get worse, stop and make sure you are following the directions exactly. If the pain is still present after adjustments, discontinue the exercise  until you can discuss the trouble with your caregiver. STRENGTH - Wrist Flexors  Sit with your right / left forearm palm-up and fully supported on a table or countertop. Your elbow should be resting below the height of your shoulder. Allow your wrist to extend over the edge of the surface.  Loosely holding a __________ weight, or a piece of rubber exercise band or tubing, slowly curl your hand up toward your forearm.  Hold this position for __________ seconds. Slowly lower the wrist back to the starting position in a controlled manner. Repeat __________ times. Complete this exercise __________ times per day.  STRENGTH - Wrist Extensors  Sit with your right / left forearm palm-down and fully supported on a table or countertop. Your elbow should be resting below the height of your shoulder. Allow your wrist to extend over the edge of the surface.  Loosely holding a __________ weight, or a piece of rubber exercise band or tubing, slowly curl your hand up toward your forearm.  Hold this position for __________ seconds. Slowly lower the wrist back to the starting position in a controlled manner. Repeat __________ times. Complete this exercise __________ times per day.  STRENGTH - Ulnar Deviators  Stand with a ____________________ weight in your right / left hand, or sit while holding a rubber exercise band or tubing, with your healthy arm supported on a table or countertop.  Move your wrist, so that your pinkie travels toward your forearm and your thumb moves away from your forearm.  Hold this position for __________ seconds and then slowly lower the wrist back to the starting position. Repeat __________ times. Complete this exercise __________ times per day STRENGTH - Radial Deviators  Stand with a ____________________ weight in your right / left hand, or sit while holding a rubber exercise band or tubing, with your injured arm supported on a table or countertop.  Raise your hand upward in  front of you or pull up on the rubber tubing.  Hold this position for __________ seconds and then slowly lower the wrist back to the starting position. Repeat __________ times. Complete this exercise __________ times per day. STRENGTH - Forearm Supinators   Sit with your right / left forearm supported on a table, keeping your elbow below shoulder height. Rest your hand over the edge, palm down.  Gently grip a hammer or a soup ladle.  Without moving your elbow, slowly turn your palm and hand upward to a "thumbs-up" position.  Hold this position for __________ seconds. Slowly return to the starting position. Repeat __________ times. Complete this exercise __________ times per day.  STRENGTH - Forearm Pronators   Sit with your right / left forearm supported on a table, keeping your elbow below shoulder height. Rest your hand over the edge, palm up.  Gently grip a hammer or a soup ladle.  Without moving your elbow, slowly turn your palm and hand upward to a "thumbs-up" position.  Hold this position for __________ seconds. Slowly return to the starting position. Repeat __________ times. Complete this exercise __________ times per day.  STRENGTH - Grip  Grasp a tennis ball, a dense sponge, or a large, rolled sock in your hand.  Squeeze as hard as you can, without increasing any pain.  Hold this position for __________ seconds. Release your grip slowly.  Repeat __________ times. Complete this exercise __________ times per day.  STRENGTH - Elbow Extensors, Isometric  Stand or sit upright, on a firm surface. Place your right / left arm so that your palm faces your stomach, and it is at the height of your waist.  Place your opposite hand on the underside of your forearm. Gently push up as your right / left arm resists. Push as hard as you can with both arms, without causing any pain or movement at your right / left elbow. Hold this stationary position for __________ seconds. Gradually  release the tension in both arms. Allow your muscles to relax completely before repeating. Document Released: 08/28/2005 Document Revised: 01/12/2014 Document Reviewed: 12/10/2008 [facility] Patient Information 2015 [place], [place]. This information is not intended to replace advice given to you by your health care provider. Make sure you discuss any questions you have with your health care provider.  Nitroglycerin Protocol   Apply 1/4 nitroglycerin patch to affected area daily.  Change position of patch within the affected area every 24 hours.  You may experience a headache during the first 1-2 weeks of using the patch, these should subside.  If you experience headaches after beginning nitroglycerin patch treatment, you may take your preferred over the counter pain reliever.  Another side effect of the nitroglycerin patch is skin irritation or rash related to patch adhesive.  Please notify our office if you develop more severe headaches or rash, and stop the patch.  Tendon healing with nitroglycerin patch may require 12 to 24 weeks depending on the extent of injury.  Men should not use if taking Viagra, Cialis, or Levitra.   Do not use if you have migraines or rosacea.

## 2015-03-26 NOTE — Progress Notes (Signed)
Danny Cohen. Danny Cohen is a 49 y.o. male who presents to [facility]  today for right elbow pain. Patient suffered a contusion to his right elbow about 3 months ago. He was seen by PCP and subsequently by Dr. Darene Lamer. he was diagnosed with right lateral epicondylitis. He had a trial of conservative management with home exercise program counterforce brace and rest as well as ibuprofen. He has not improved and is here today for follow-up. He has right elbow pain especially worse with grip and wrist extension. He denies any radiating pain weakness or numbness fevers or chills. He takes ibuprofen for pain which helps. He works as a [employer]ar[employer] manager   Past Medical History  Diagnosis Date  . Esophageal reflux disease 09/03/2011  . Chronic back pain greater than 3 months duration 09/03/2011  . Gastric ulcer due to Helicobacter pylori 65/79/0383  . Peptic ulcer due to Helicobacter pylori 33/83/2919   Past Surgical History  Procedure Laterality Date  . Laparoscopic inguinal hernia repair  04/23/2012    bilateral  . Hernia repair     History  Substance Use Topics  . Smoking status: Former Smoker    Quit date: 09/11/2000  . Smokeless tobacco: Not on file  . Alcohol Use: No   ROS as above Medications: Current Outpatient Prescriptions  Medication Sig Dispense Refill  . ibuprofen (ADVIL,MOTRIN) 200 MG tablet Take 200 mg by mouth every 6 (six) hours as needed.    . nitroGLYCERIN (NITRODUR - DOSED IN MG/24 HR) 0.2 mg/hr patch Apply 1/4 patch to skin at the area of pain. Change patch daily. Use for insertional tendinopathy. 30 patch 1   No current facility-administered medications for this visit.   Allergies  Allergen Reactions  . Nsaids Other (See Comments)    Pt has history of peptic ulcer      Exam:  BP 109/68 mmHg  Pulse 68  Wt 170 lb (77.111 kg) Gen: Well NAD HEENT: EOMI,  MMM Right elbow normal-appearing tender palpation lateral epicondyles pain with wrist  extension and grip. Elbow motion wrist motion pulses capillary refill sensation are intact. Exts: Brisk capillary refill, warm and well perfused.   Limited musculoskeletal ultrasound of the right elbow shows a small defect within the cortex of the bone at the tip of the lateral epicondyles with hypoechoic change at the superficial portion of the insertion. Increased vascular activity noted on Doppler exam.  Diagnosis: consistent with lateral epicondylitis.  No results found for this or any previous visit (from the past 24 hour(s)). No results found.   Please see individual assessment and plan sections.

## 2015-03-29 ENCOUNTER — Telehealth: Payer: Self-pay

## 2015-03-29 NOTE — Telephone Encounter (Signed)
[facility] called and left a message stating that the nitroglycerin patch can not be cut. Please advise.

## 2015-03-31 ENCOUNTER — Telehealth: Payer: Self-pay

## 2015-03-31 ENCOUNTER — Ambulatory Visit (INDEPENDENT_AMBULATORY_CARE_PROVIDER_SITE_OTHER): Payer: BLUE CROSS/BLUE SHIELD | Admitting: Family Medicine

## 2015-03-31 ENCOUNTER — Encounter: Payer: Self-pay | Admitting: Family Medicine

## 2015-03-31 VITALS — BP 129/67 | HR 69 | Wt 167.0 lb

## 2015-03-31 DIAGNOSIS — M7711 Lateral epicondylitis, right elbow: Secondary | ICD-10-CM | POA: Diagnosis not present

## 2015-03-31 NOTE — Telephone Encounter (Signed)
Left detailed with recommendations and for a return call.

## 2015-03-31 NOTE — Patient Instructions (Signed)
Thank you for coming in today. Call or go to the ER if you develop a large red swollen joint with extreme pain or oozing puss.   Lateral Epicondylitis (Tennis Elbow) with Rehab Lateral epicondylitis involves inflammation and pain around the outer portion of the elbow. The pain is caused by inflammation of the tendons in the forearm that bring back (extend) the wrist. Lateral epicondylitis is also called tennis elbow, because it is very common in tennis players. However, it may occur in any individual who extends the wrist repetitively. If lateral epicondylitis is left untreated, it may become a chronic problem. SYMPTOMS   Pain, tenderness, and inflammation on the outer (lateral) side of the elbow.  Pain or weakness with gripping activities.  Pain that increases with wrist-twisting motions (playing tennis, using a screwdriver, opening a door or a jar).  Pain with lifting objects, including a coffee cup. CAUSES  Lateral epicondylitis is caused by inflammation of the tendons that extend the wrist. Causes of injury may include:  Repetitive stress and strain on the muscles and tendons that extend the wrist.  Sudden change in activity level or intensity.  Incorrect grip in racquet sports.  Incorrect grip size of racquet (often too large).  Incorrect hitting position or technique (usually backhand, leading with the elbow).  Using a racket that is too heavy. RISK INCREASES WITH:  Sports or occupations that require repetitive and/or strenuous forearm and wrist movements (tennis, squash, racquetball, carpentry).  Poor wrist and forearm strength and flexibility.  Failure to warm up properly before activity.  Resuming activity before healing, rehabilitation, and conditioning are complete. PREVENTION   Warm up and stretch properly before activity.  Maintain physical fitness:  Strength, flexibility, and endurance.  Cardiovascular fitness.  Wear and use properly fitted  equipment.  Learn and use proper technique and have a coach correct improper technique.  Wear a tennis elbow (counterforce) brace. PROGNOSIS  The course of this condition depends on the degree of the injury. If treated properly, acute cases (symptoms lasting less than 4 weeks) are often resolved in 2 to 6 weeks. Chronic (longer lasting cases) often resolve in 3 to 6 months but may require physical therapy. RELATED COMPLICATIONS   Frequently recurring symptoms, resulting in a chronic problem. Properly treating the problem the first time decreases frequency of recurrence.  Chronic inflammation, scarring tendon degeneration, and partial tendon tear, requiring surgery.  Delayed healing or resolution of symptoms. TREATMENT  Treatment first involves the use of ice and medicine to reduce pain and inflammation. Strengthening and stretching exercises may help reduce discomfort if performed regularly. These exercises may be performed at home if the condition is an acute injury. Chronic cases may require a referral to a physical therapist for evaluation and treatment. Your caregiver may advise a corticosteroid injection to help reduce inflammation. Rarely, surgery is needed. MEDICATION  If pain medicine is needed, nonsteroidal anti-inflammatory medicines (aspirin and ibuprofen), or other minor pain relievers (acetaminophen), are often advised.  Do not take pain medicine for 7 days before surgery.  Prescription pain relievers may be given, if your caregiver thinks they are needed. Use only as directed and only as much as you need.  Corticosteroid injections may be recommended. These injections should be reserved only for the most severe cases, because they can only be given a certain number of times. HEAT AND COLD  Cold treatment (icing) should be applied for 10 to 15 minutes every 2 to 3 hours for inflammation and pain, and immediately after  activity that aggravates your symptoms. Use ice packs or an  ice massage.  Heat treatment may be used before performing stretching and strengthening activities prescribed by your caregiver, physical therapist, or athletic trainer. Use a heat pack or a warm water soak. SEEK MEDICAL CARE IF: Symptoms get worse or do not improve in 2 weeks, despite treatment. EXERCISES  RANGE OF MOTION (ROM) AND STRETCHING EXERCISES - Epicondylitis, Lateral (Tennis Elbow) These exercises may help you when beginning to rehabilitate your injury. Your symptoms may go away with or without further involvement from your physician, physical therapist, or athletic trainer. While completing these exercises, remember:   Restoring tissue flexibility helps normal motion to return to the joints. This allows healthier, less painful movement and activity.  An effective stretch should be held for at least 30 seconds.  A stretch should never be painful. You should only feel a gentle lengthening or release in the stretched tissue. RANGE OF MOTION - Wrist Flexion, Active-Assisted  Extend your right / left elbow with your fingers pointing down.*  Gently pull the back of your hand towards you, until you feel a gentle stretch on the top of your forearm.  Hold this position for __________ seconds. Repeat __________ times. Complete this exercise __________ times per day.  *If directed by your physician, physical therapist or athletic trainer, complete this stretch with your elbow bent, rather than extended. RANGE OF MOTION - Wrist Extension, Active-Assisted  Extend your right / left elbow and turn your palm upwards.*  Gently pull your palm and fingertips back, so your wrist extends and your fingers point more toward the ground.  You should feel a gentle stretch on the inside of your forearm.  Hold this position for __________ seconds. Repeat __________ times. Complete this exercise __________ times per day. *If directed by your physician, physical therapist or athletic trainer, complete  this stretch with your elbow bent, rather than extended. STRETCH - Wrist Flexion  Place the back of your right / left hand on a tabletop, leaving your elbow slightly bent. Your fingers should point away from your body.  Gently press the back of your hand down onto the table by straightening your elbow. You should feel a stretch on the top of your forearm.  Hold this position for __________ seconds. Repeat __________ times. Complete this stretch __________ times per day.  STRETCH - Wrist Extension   Place your right / left fingertips on a tabletop, leaving your elbow slightly bent. Your fingers should point backwards.  Gently press your fingers and palm down onto the table by straightening your elbow. You should feel a stretch on the inside of your forearm.  Hold this position for __________ seconds. Repeat __________ times. Complete this stretch __________ times per day.  STRENGTHENING EXERCISES - Epicondylitis, Lateral (Tennis Elbow) These exercises may help you when beginning to rehabilitate your injury. They may resolve your symptoms with or without further involvement from your physician, physical therapist, or athletic trainer. While completing these exercises, remember:   Muscles can gain both the endurance and the strength needed for everyday activities through controlled exercises.  Complete these exercises as instructed by your physician, physical therapist or athletic trainer. Increase the resistance and repetitions only as guided.  You may experience muscle soreness or fatigue, but the pain or discomfort you are trying to eliminate should never worsen during these exercises. If this pain does get worse, stop and make sure you are following the directions exactly. If the pain is still present  after adjustments, discontinue the exercise until you can discuss the trouble with your caregiver. STRENGTH - Wrist Flexors  Sit with your right / left forearm palm-up and fully supported on  a table or countertop. Your elbow should be resting below the height of your shoulder. Allow your wrist to extend over the edge of the surface.  Loosely holding a __________ weight, or a piece of rubber exercise band or tubing, slowly curl your hand up toward your forearm.  Hold this position for __________ seconds. Slowly lower the wrist back to the starting position in a controlled manner. Repeat __________ times. Complete this exercise __________ times per day.  STRENGTH - Wrist Extensors  Sit with your right / left forearm palm-down and fully supported on a table or countertop. Your elbow should be resting below the height of your shoulder. Allow your wrist to extend over the edge of the surface.  Loosely holding a __________ weight, or a piece of rubber exercise band or tubing, slowly curl your hand up toward your forearm.  Hold this position for __________ seconds. Slowly lower the wrist back to the starting position in a controlled manner. Repeat __________ times. Complete this exercise __________ times per day.  STRENGTH - Ulnar Deviators  Stand with a ____________________ weight in your right / left hand, or sit while holding a rubber exercise band or tubing, with your healthy arm supported on a table or countertop.  Move your wrist, so that your pinkie travels toward your forearm and your thumb moves away from your forearm.  Hold this position for __________ seconds and then slowly lower the wrist back to the starting position. Repeat __________ times. Complete this exercise __________ times per day STRENGTH - Radial Deviators  Stand with a ____________________ weight in your right / left hand, or sit while holding a rubber exercise band or tubing, with your injured arm supported on a table or countertop.  Raise your hand upward in front of you or pull up on the rubber tubing.  Hold this position for __________ seconds and then slowly lower the wrist back to the starting  position. Repeat __________ times. Complete this exercise __________ times per day. STRENGTH - Forearm Supinators   Sit with your right / left forearm supported on a table, keeping your elbow below shoulder height. Rest your hand over the edge, palm down.  Gently grip a hammer or a soup ladle.  Without moving your elbow, slowly turn your palm and hand upward to a "thumbs-up" position.  Hold this position for __________ seconds. Slowly return to the starting position. Repeat __________ times. Complete this exercise __________ times per day.  STRENGTH - Forearm Pronators   Sit with your right / left forearm supported on a table, keeping your elbow below shoulder height. Rest your hand over the edge, palm up.  Gently grip a hammer or a soup ladle.  Without moving your elbow, slowly turn your palm and hand upward to a "thumbs-up" position.  Hold this position for __________ seconds. Slowly return to the starting position. Repeat __________ times. Complete this exercise __________ times per day.  STRENGTH - Grip  Grasp a tennis ball, a dense sponge, or a large, rolled sock in your hand.  Squeeze as hard as you can, without increasing any pain.  Hold this position for __________ seconds. Release your grip slowly. Repeat __________ times. Complete this exercise __________ times per day.  STRENGTH - Elbow Extensors, Isometric  Stand or sit upright, on a firm surface.  Place your right / left arm so that your palm faces your stomach, and it is at the height of your waist.  Place your opposite hand on the underside of your forearm. Gently push up as your right / left arm resists. Push as hard as you can with both arms, without causing any pain or movement at your right / left elbow. Hold this stationary position for __________ seconds. Gradually release the tension in both arms. Allow your muscles to relax completely before repeating. Document Released: 08/28/2005 Document Revised:  01/12/2014 Document Reviewed: 12/10/2008 [facility] Patient Information 2015 [place], [place]. This information is not intended to replace advice given to you by your health care provider. Make sure you discuss any questions you have with your health care provider.

## 2015-03-31 NOTE — Assessment & Plan Note (Signed)
Steroid injection today. Continue bracing and Home Exercise Program follow-up in 4 weeks

## 2015-03-31 NOTE — Telephone Encounter (Signed)
Return to clinic for injection.  He can try cutting the patch into 8th or 16th and try that dosage for a week.  Headache gets better after 1 week.

## 2015-03-31 NOTE — Progress Notes (Signed)
Danny Cohen is a 49 y.o. male who presents to [place]  today for tennis elbow. Patient was seen last week for lateral epicondylitis. He had a trial of nitroglycerin patch as well as unable to tolerate them due to headache. He is here today for injection. He feels well however notes continued right lateral elbow pain. Pain is again worse with extension and flexion. No radiating pain weakness or numbness. He has tried bracing and over-the-counter medicines.   Past Medical History  Diagnosis Date  . Esophageal reflux disease 09/03/2011  . Chronic back pain greater than 3 months duration 09/03/2011  . Gastric ulcer due to Helicobacter pylori 56/38/7564  . Peptic ulcer due to Helicobacter pylori 33/29/5188   Past Surgical History  Procedure Laterality Date  . Laparoscopic inguinal hernia repair  04/23/2012    bilateral  . Hernia repair     History  Substance Use Topics  . Smoking status: Former Smoker    Quit date: 09/11/2000  . Smokeless tobacco: Not on file  . Alcohol Use: No   ROS as above Medications: Current Outpatient Prescriptions  Medication Sig Dispense Refill  . ibuprofen (ADVIL,MOTRIN) 200 MG tablet Take 200 mg by mouth every 6 (six) hours as needed.     No current facility-administered medications for this visit.   Allergies  Allergen Reactions  . Nsaids Other (See Comments)    Pt has history of peptic ulcer      Exam:  BP 129/67 mmHg  Pulse 69  Wt 167 lb (75.751 kg) Gen: Well NAD Right elbow normal-appearing tender palpation lateral upper condyle pain with resisted wrist extension. Exts: Brisk capillary refill, warm and well perfused.   Procedure: Real-time Ultrasound Guided Injection of right lateral epicondyles  Device: GE Logiq E  Verbal informed consent obtained. Discussed risks and benefits of procedure. Warned about infection bleeding damage to structures skin hypopigmentation and fat atrophy among  others. Patient expresses understanding and agreement Time-out conducted.  Noted no overlying erythema, induration, or other signs of local infection.  Skin prepped in a sterile fashion.  Local anesthesia: Topical Ethyl chloride.  With sterile technique and under real time ultrasound guidance: 40 mg triamcinolone and 3 mL of Marcaine injected. I removed the needle during the procedure to reposition however the needle accidentally touched the ultrasound probe. The needle was exchanged and the skin was resterilized with alcohol. The procedure continued without further complications.   Pain immediately resolved suggesting accurate placement of the medication.  Advised to call if fevers/chills, erythema, induration, drainage, or persistent bleeding.  Images permanently stored and available for review in the ultrasound unit.  Impression: Technically successful ultrasound guided injection.    No results found for this or any previous visit (from the past 24 hour(s)). No results found.   Please see individual assessment and plan sections.

## 2015-03-31 NOTE — Telephone Encounter (Signed)
Danny Cohen reports neck and headaches with the nitroglycerin patch. He has stopped the nitroglycerin patches and would like recommendations for his elbow.

## 2015-04-26 ENCOUNTER — Encounter: Payer: Self-pay | Admitting: Family Medicine

## 2015-04-26 ENCOUNTER — Ambulatory Visit (INDEPENDENT_AMBULATORY_CARE_PROVIDER_SITE_OTHER): Payer: BLUE CROSS/BLUE SHIELD | Admitting: Family Medicine

## 2015-04-26 VITALS — BP 106/58 | HR 67 | Wt 166.0 lb

## 2015-04-26 DIAGNOSIS — M7711 Lateral epicondylitis, right elbow: Secondary | ICD-10-CM

## 2015-04-26 NOTE — Assessment & Plan Note (Signed)
Much better. Continue wrist exercises. Return as needed

## 2015-04-26 NOTE — Progress Notes (Signed)
Danny Cohen is a 49 y.o. male who presents to [facility]  today for follow up right elbow pain. Patient had lateral upper condyle steroid injection on 03/31/15 after failing trial of home exercises and nitroglycerin patches. He has been continuing home exercises and feels much better. He is able to do his work pain-free. No radiating pain weakness or numbness fevers or chills.   Past Medical History  Diagnosis Date  . Esophageal reflux disease 09/03/2011  . Chronic back pain greater than 3 months duration 09/03/2011  . Gastric ulcer due to Helicobacter pylori 16/06/9603  . Peptic ulcer due to Helicobacter pylori 54/05/8118   Past Surgical History  Procedure Laterality Date  . Laparoscopic inguinal hernia repair  04/23/2012    bilateral  . Hernia repair     Social History  Substance Use Topics  . Smoking status: Former Smoker    Quit date: 09/11/2000  . Smokeless tobacco: Not on file  . Alcohol Use: No   ROS as above Medications: Current Outpatient Prescriptions  Medication Sig Dispense Refill  . ibuprofen (ADVIL,MOTRIN) 200 MG tablet Take 200 mg by mouth every 6 (six) hours as needed.     No current facility-administered medications for this visit.   Allergies  Allergen Reactions  . Nsaids Other (See Comments)    Pt has history of peptic ulcer      Exam:  BP 106/58 mmHg  Pulse 67  Wt 166 lb (75.297 kg) Gen: Well NAD HEENT: EOMI,  MMM Lateral epicondyles nontender with normal wrist and hand motion Exts: Brisk capillary refill, warm and well perfused.   No results found for this or any previous visit (from the past 24 hour(s)). No results found.   Please see individual assessment and plan sections.

## 2015-09-03 ENCOUNTER — Ambulatory Visit (INDEPENDENT_AMBULATORY_CARE_PROVIDER_SITE_OTHER): Payer: BLUE CROSS/BLUE SHIELD | Admitting: Family Medicine

## 2015-09-03 ENCOUNTER — Encounter: Payer: Self-pay | Admitting: Family Medicine

## 2015-09-03 VITALS — BP 119/79 | HR 66 | Temp 98.1°F | Wt 166.0 lb

## 2015-09-03 DIAGNOSIS — K5732 Diverticulitis of large intestine without perforation or abscess without bleeding: Secondary | ICD-10-CM | POA: Diagnosis not present

## 2015-09-03 MED ORDER — METRONIDAZOLE 500 MG PO TABS: 500.0000 mg | ORAL_TABLET | Freq: Three times a day (TID) | ORAL | Status: DC

## 2015-09-03 MED ORDER — CIPROFLOXACIN HCL 500 MG PO TABS: 500.0000 mg | ORAL_TABLET | Freq: Two times a day (BID) | ORAL | Status: DC

## 2015-09-03 NOTE — Progress Notes (Signed)
CC: Danny Cohen is a 49 y.o. male is here for Back Pain   Subjective: HPI:  For the past 4 - 5 days he's been experiencing a persistent left flank achiness that is not responding to ibuprofen or a heating pad. There is no positional component to his pain. It seems to be radiating into the left lower quadrant of the abdomen. 3 days ago he began having diarrhea 3 times a day which is out of character for him. He tells me he feels great other than these symptoms above. He denies fevers, chills, dysuria, blood in urine, blood in stool. He denies nausea or abdominal pain other than that described above. Symptoms are mild to moderate in severity. It is not uncommon for her to wake him up in the middle the night however he can fall back asleep in a few minutes.   Review Of Systems Outlined In HPI  Past Medical History  Diagnosis Date  . Esophageal reflux disease 09/03/2011  . Chronic back pain greater than 3 months duration 09/03/2011  . Gastric ulcer due to Helicobacter pylori [id number]  . Peptic ulcer due to Helicobacter pylori [id number]    Past Surgical History  Procedure Laterality Date  . Laparoscopic inguinal hernia repair  04/23/2012    bilateral  . Hernia repair     Family History  Problem Relation Age of Onset  . Heart failure Mother     Social History   Social History  . Marital Status: Married    Spouse Name: N/A  . Number of Children: N/A  . Years of Education: N/A   Occupational History  . Not on file.   Social History Main Topics  . Smoking status: Former Smoker    Quit date: 09/11/2000  . Smokeless tobacco: Not on file  . Alcohol Use: No  . Drug Use: No  . Sexual Activity: Not on file   Other Topics Concern  . Not on file   Social History Narrative     Objective: BP 119/79 mmHg  Pulse 66  Temp(Src) 98.1 F (36.7 C) (Oral)  Wt 166 lb (75.297 kg)  General: Alert and Oriented, No Acute Distress HEENT: Pupils equal, round, reactive to light.  Conjunctivae clear.   Lungs: Clear to auscultation bilaterally, no wheezing/ronchi/rales.  Comfortable work of breathing. Good air movement. Cardiac: Regular rate and rhythm. Normal S1/S2.  No murmurs, rubs, nor gallops.   Abdomen: normal bowel sounds. No palpable masses. He has rebound tenderness in the left lower quadrant but no rebound in the right lower quadrant. There is no guarding or rigidity. No CVA tenderness on the left or right. Pain is not reproducible with palpation of the back. Extremities: No peripheral edema.  Strong peripheral pulses.  Mental Status: No depression, anxiety, nor agitation. Skin: Warm and dry.  Assessment & Plan: Danny Cohen was seen today for back pain.  Diagnoses and all orders for this visit:  Diverticulitis of colon -     ciprofloxacin (CIPRO) 500 MG tablet; Take 1 tablet (500 mg total) by mouth 2 (two) times daily. -     metroNIDAZOLE (FLAGYL) 500 MG tablet; Take 1 tablet (500 mg total) by mouth 3 (three) times daily.   High suspicion for diverticulitis, thank you for the appears to be uncomplicated. Starting Cipro and Flagyl. Discussed signs and symptoms that would more emergent evaluation over the weekend will close. If symptoms have not improved any degree by Monday I encouraged him to seek out evaluation with urgent care for  consideration of CT scan.   Return if symptoms worsen or fail to improve.

## 2015-09-10 ENCOUNTER — Ambulatory Visit (INDEPENDENT_AMBULATORY_CARE_PROVIDER_SITE_OTHER): Payer: BLUE CROSS/BLUE SHIELD | Admitting: Family Medicine

## 2015-09-10 ENCOUNTER — Encounter: Payer: Self-pay | Admitting: Family Medicine

## 2015-09-10 VITALS — BP 116/75 | HR 66 | Wt 168.0 lb

## 2015-09-10 DIAGNOSIS — M7711 Lateral epicondylitis, right elbow: Secondary | ICD-10-CM | POA: Diagnosis not present

## 2015-09-10 NOTE — Assessment & Plan Note (Signed)
Not improved. Plan for MRI to evaluate anatomical changes. X-ray for of eyes performed bodies given history of metal shaving exposure. Return following MRI.

## 2015-09-10 NOTE — Patient Instructions (Signed)
Thank you for coming in today. Get xray today.  Return following MRI.

## 2015-09-10 NOTE — Progress Notes (Signed)
       Danny Cohen. Danny Cohen is a 49 y.o. male who presents to [facility] today for follow-up right lateral epicondylitis. Patient was last seen in August 2016 following a steroid shot to the right elbow. In the interim he notes the pain has continued. He has daily pain interferes with his ability to work. He denies any radiating pain weakness or numbness or new injury. Patient tried and failed conservative measures such as home exercises therapy and nitroglycerin patches and injection.   Past Medical History  Diagnosis Date  . Esophageal reflux disease 09/03/2011  . Chronic back pain greater than 3 months duration 09/03/2011  . Gastric ulcer due to Helicobacter pylori [id number]  . Peptic ulcer due to Helicobacter pylori [id number]   Past Surgical History  Procedure Laterality Date  . Laparoscopic inguinal hernia repair  04/23/2012    bilateral  . Hernia repair     Social History  Substance Use Topics  . Smoking status: Former Smoker    Quit date: 09/11/2000  . Smokeless tobacco: Not on file  . Alcohol Use: No   family history includes Heart failure in his mother.  ROS as above Medications: Current Outpatient Prescriptions  Medication Sig Dispense Refill  . ciprofloxacin (CIPRO) 500 MG tablet Take 1 tablet (500 mg total) by mouth 2 (two) times daily. 20 tablet 0  . ibuprofen (ADVIL,MOTRIN) 200 MG tablet Take 200 mg by mouth every 6 (six) hours as needed.    . metroNIDAZOLE (FLAGYL) 500 MG tablet Take 1 tablet (500 mg total) by mouth 3 (three) times daily. 30 tablet 0   No current facility-administered medications for this visit.   Allergies  Allergen Reactions  . Nsaids Other (See Comments)    Pt has history of peptic ulcer      Exam:  BP 116/75 mmHg  Pulse 66  Wt 168 lb (76.204 kg) Gen: Well NAD Right elbow is normal-appearing. Tender palpation right lateral epicondyle. Pain  with resisted wrist extension. Pulses capillary refill and sensation intact distally.  No results found for this or any previous visit (from the past 24 hour(s)). No results found.   Please see individual assessment and plan sections.

## 2015-09-20 ENCOUNTER — Other Ambulatory Visit: Payer: BLUE CROSS/BLUE SHIELD

## 2015-09-20 ENCOUNTER — Ambulatory Visit (INDEPENDENT_AMBULATORY_CARE_PROVIDER_SITE_OTHER): Payer: BLUE CROSS/BLUE SHIELD

## 2015-09-20 DIAGNOSIS — S46811A Strain of other muscles, fascia and tendons at shoulder and upper arm level, right arm, initial encounter: Secondary | ICD-10-CM

## 2015-09-20 DIAGNOSIS — X58XXXA Exposure to other specified factors, initial encounter: Secondary | ICD-10-CM | POA: Diagnosis not present

## 2015-09-20 NOTE — Progress Notes (Signed)
Quick Note:  MRI shows severe tennis elbow with a partial thickness tear. Return to clinic to look at images and discuss options. Surgery vs PRP injection. ______

## 2015-09-23 ENCOUNTER — Ambulatory Visit (INDEPENDENT_AMBULATORY_CARE_PROVIDER_SITE_OTHER): Payer: BLUE CROSS/BLUE SHIELD | Admitting: Family Medicine

## 2015-09-23 ENCOUNTER — Encounter: Payer: Self-pay | Admitting: Family Medicine

## 2015-09-23 VITALS — BP 125/70 | HR 73 | Wt 166.0 lb

## 2015-09-23 VITALS — BP 127/70 | HR 73 | Wt 166.0 lb

## 2015-09-23 DIAGNOSIS — Z23 Encounter for immunization: Secondary | ICD-10-CM

## 2015-09-23 DIAGNOSIS — M7711 Lateral epicondylitis, right elbow: Secondary | ICD-10-CM | POA: Diagnosis not present

## 2015-09-23 DIAGNOSIS — R1032 Left lower quadrant pain: Secondary | ICD-10-CM

## 2015-09-23 DIAGNOSIS — R10829 Rebound abdominal tenderness, unspecified site: Secondary | ICD-10-CM

## 2015-09-23 DIAGNOSIS — R1031 Right lower quadrant pain: Secondary | ICD-10-CM

## 2015-09-23 DIAGNOSIS — R197 Diarrhea, unspecified: Secondary | ICD-10-CM

## 2015-09-23 NOTE — Progress Notes (Signed)
       Danny Cohen. Danny Cohen is a 50 y.o. male who presents to [facility] today for follow-up right elbow. Patient has a history of lateral epicondylitis of the right elbow. He had a recent MRI after failing conservative management showing severe tendinosis and partial thickness tears. Pain continues and the pain is worse with work.   Past Medical History  Diagnosis Date  . Esophageal reflux disease 09/03/2011  . Chronic back pain greater than 3 months duration 09/03/2011  . Gastric ulcer due to Helicobacter pylori [id number]  . Peptic ulcer due to Helicobacter pylori [id number]   Past Surgical History  Procedure Laterality Date  . Laparoscopic inguinal hernia repair  04/23/2012    bilateral  . Hernia repair     Social History  Substance Use Topics  . Smoking status: Former Smoker    Quit date: 09/11/2000  . Smokeless tobacco: Not on file  . Alcohol Use: No   family history includes Heart failure in his mother.  ROS as above Medications: Current Outpatient Prescriptions  Medication Sig Dispense Refill  . ciprofloxacin (CIPRO) 500 MG tablet Take 1 tablet (500 mg total) by mouth 2 (two) times daily. 20 tablet 0  . ibuprofen (ADVIL,MOTRIN) 200 MG tablet Take 200 mg by mouth every 6 (six) hours as needed.    . metroNIDAZOLE (FLAGYL) 500 MG tablet Take 1 tablet (500 mg total) by mouth 3 (three) times daily. 30 tablet 0   No current facility-administered medications for this visit.   Allergies  Allergen Reactions  . Nsaids Other (See Comments)    Pt has history of peptic ulcer      Exam:  BP 127/70 mmHg  Pulse 73  Wt 166 lb (75.297 kg) Gen: Well NAD It elbow is tender to palpation at the lateral epicondyle. Pain is resisted wrist extension  MRI Right elbow reviewed  No results found for this or any previous visit (from the past 24 hour(s)). No results found.   Please see  individual assessment and plan sections.

## 2015-09-23 NOTE — Progress Notes (Signed)
CC: Danny Cohen is a 50 y.o. male is here for No chief complaint on file.   Subjective: HPI:  Complains of his abdominal discomfort has returned. It localized in the right and left lower quadrant and radiates into the left back. He also has a complaint of epigastric discomfort now. Symptoms were significantly improved after taking one week of Cipro and Flagyl. They were absent for a few days but around the new year they began to return. He's also developed diarrhea over the past few days. He denies blood or melena appearance to his stool. He's been having some nausea this morning but is tolerating food and liquids. Denies fevers, chills or any genitourinary complaints. Denies any chest pain shortness of breath or confusion. Symptoms are currently mild to moderate in severity   Review Of Systems Outlined In HPI  Past Medical History  Diagnosis Date  . Esophageal reflux disease 09/03/2011  . Chronic back pain greater than 3 months duration 09/03/2011  . Gastric ulcer due to Helicobacter pylori [id number]  . Peptic ulcer due to Helicobacter pylori [id number]    Past Surgical History  Procedure Laterality Date  . Laparoscopic inguinal hernia repair  04/23/2012    bilateral  . Hernia repair     Family History  Problem Relation Age of Onset  . Heart failure Mother     Social History   Social History  . Marital Status: Married    Spouse Name: N/A  . Number of Children: N/A  . Years of Education: N/A   Occupational History  . Not on file.   Social History Main Topics  . Smoking status: Former Smoker    Quit date: 09/11/2000  . Smokeless tobacco: Not on file  . Alcohol Use: No  . Drug Use: No  . Sexual Activity: Not on file   Other Topics Concern  . Not on file   Social History Narrative     Objective: BP 125/70 mmHg  Pulse 73  Wt 166 lb (75.297 kg)  Vital signs reviewed. General: Alert and Oriented, No Acute Distress HEENT: Pupils equal, round, reactive to light.  Conjunctivae clear.  External ears unremarkable.  Moist mucous membranes. Lungs: Clear and comfortable work of breathing, speaking in full sentences without accessory muscle use. Cardiac: Regular rate and rhythm.  Abdomen: normal bowel sounds. No palpable masses. He has rebound tenderness in the left lower quadrant but no rebound in the right lower quadrant. There is no guarding or rigidity. No CVA tenderness on the left or right. Pain is not reproducible with palpation of the back. Extremities: No peripheral edema.  Strong peripheral pulses.  Mental Status: No depression, anxiety, nor agitation. Logical though process. Skin: Warm and dry.  Assessment & Plan: Diagnoses and all orders for this visit:  RLQ abdominal pain -     CT Abdomen Pelvis W Contrast  LLQ pain -     CT Abdomen Pelvis W Contrast  Diarrhea, unspecified type -     CT Abdomen Pelvis W Contrast  Rebound tenderness -     CT Abdomen Pelvis W Contrast   As was the plan last month and like to get a CT scan of the abdomen and pelvis for further evaluation of when I originally thought was just diverticulitis. Signs and symptoms requring emergent/urgent reevaluation were discussed with the patient.  Return if symptoms worsen or fail to improve.

## 2015-09-23 NOTE — Patient Instructions (Signed)
Thank you for coming in today.  Return for PRP

## 2015-09-23 NOTE — Assessment & Plan Note (Signed)
Discussed options. Patient has high-grade disease and has failed conservative management. Discussed referral for surgery versus PRP injection versus living with that. After informed discussion of risks versus benefits and evidence for PRP injection patient elects to proceed with PRP injection. This will be performed on a Friday morning.

## 2015-09-24 ENCOUNTER — Telehealth: Payer: Self-pay | Admitting: Family Medicine

## 2015-09-24 ENCOUNTER — Ambulatory Visit (HOSPITAL_BASED_OUTPATIENT_CLINIC_OR_DEPARTMENT_OTHER)
Admission: RE | Admit: 2015-09-24 | Discharge: 2015-09-24 | Disposition: A | Discharge status: Home / Self Care | Payer: BLUE CROSS/BLUE SHIELD | Source: Ambulatory Visit | Attending: Family Medicine | Admitting: Family Medicine

## 2015-09-24 DIAGNOSIS — K769 Liver disease, unspecified: Secondary | ICD-10-CM | POA: Insufficient documentation

## 2015-09-24 DIAGNOSIS — R110 Nausea: Secondary | ICD-10-CM | POA: Diagnosis not present

## 2015-09-24 DIAGNOSIS — N200 Calculus of kidney: Secondary | ICD-10-CM | POA: Insufficient documentation

## 2015-09-24 DIAGNOSIS — R1032 Left lower quadrant pain: Secondary | ICD-10-CM | POA: Insufficient documentation

## 2015-09-24 DIAGNOSIS — R197 Diarrhea, unspecified: Secondary | ICD-10-CM | POA: Diagnosis not present

## 2015-09-24 DIAGNOSIS — R1031 Right lower quadrant pain: Secondary | ICD-10-CM | POA: Diagnosis not present

## 2015-09-24 MED ORDER — IOHEXOL 300 MG/ML  SOLN
100.0000 mL | Freq: Once | INTRAMUSCULAR | Status: AC | PRN
  Administered 2015-09-24: 100 mL via INTRAVENOUS

## 2015-09-24 MED ORDER — DICYCLOMINE HCL 20 MG PO TABS: 20.0000 mg | ORAL_TABLET | Freq: Three times a day (TID) | ORAL | Status: DC

## 2015-09-24 NOTE — Telephone Encounter (Signed)
vm left for pt to return call

## 2015-09-24 NOTE — Telephone Encounter (Signed)
Will you please let patient know that his CT scan was reassuring and did not show any signs of infection or inflammation that would be causing his pain.  I'd recommend starting one week of a medication called dicyclomine that helps reduce bowel spasms.I'll send this to [facility] on [address]

## 2015-09-24 NOTE — Telephone Encounter (Signed)
Pt advised.

## 2015-10-22 ENCOUNTER — Ambulatory Visit (INDEPENDENT_AMBULATORY_CARE_PROVIDER_SITE_OTHER): Payer: Self-pay | Admitting: Family Medicine

## 2015-10-22 DIAGNOSIS — M7711 Lateral epicondylitis, right elbow: Secondary | ICD-10-CM

## 2015-10-22 MED ORDER — HYDROCODONE-ACETAMINOPHEN 10-325 MG PO TABS: 0.5000 | ORAL_TABLET | Freq: Three times a day (TID) | ORAL | Status: DC | PRN

## 2015-10-22 NOTE — Progress Notes (Signed)
   Patient presents to clinic today for previously arranged PRP injection to his right lateral epicondyle.  Consent obtained and timeout performed.   27 mL of blood was drawn from the left antecubital fossa using usual sterile procedures.   The blood was then centrifuged using the PEAK device to achieve 4 mL of PRP.  Procedure: Real-time Ultrasound Guided Injection of right lateral epicondyle  Device: GE Logiq E  Images permanently stored and available for review in the ultrasound unit. Verbal informed consent obtained. Discussed risks and benefits of procedure. Warned about infection bleeding damage to structures skin hypopigmentation and fat atrophy among others. Patient expresses understanding and agreement Time-out conducted.  Noted no overlying erythema, induration, or other signs of local infection.  Skin prepped in a sterile fashion.  Local anesthesia: Topical Ethyl chloride.  With sterile technique and under real time ultrasound guidance: 6 mL of 50/50 marcaine lidocaine mixture. injected easily.  Completed without difficulty  Pain immediately resolved suggesting accurate placement of the medication.  The skin was then cleaned again with alcohol and chlorhexidine.  Ultrasound guidance once again used to guide the needle to the epicondyle. Tenotomy and injection of the 4 mL of PRP into the lateral epicondyle insertion.  Patient tolerated procedure well  Advised to call if fevers/chills, erythema, induration, drainage, or persistent bleeding.  Images permanently stored and available for review in the ultrasound unit.  Usually recommend ice, wrist bracing and compression to the elbow. Norco prescribed.  Impression: Technically successful ultrasound guided injection.  Plan: Return in one month

## 2015-10-22 NOTE — Patient Instructions (Signed)
Thank you for coming in today. Call or go to the ER if you develop a large red swollen joint with extreme pain or oozing puss.  Return in a month or so. Sooner if needed.   Use the brace and pain medicine.

## 2016-04-12 ENCOUNTER — Ambulatory Visit (INDEPENDENT_AMBULATORY_CARE_PROVIDER_SITE_OTHER): Payer: BLUE CROSS/BLUE SHIELD | Admitting: Family Medicine

## 2016-04-12 ENCOUNTER — Encounter: Payer: Self-pay | Admitting: Family Medicine

## 2016-04-12 VITALS — BP 142/76 | HR 71 | Wt 163.0 lb

## 2016-04-12 DIAGNOSIS — R5383 Other fatigue: Secondary | ICD-10-CM

## 2016-04-12 DIAGNOSIS — I499 Cardiac arrhythmia, unspecified: Secondary | ICD-10-CM

## 2016-04-12 NOTE — Progress Notes (Signed)
CC: Danny Cohen is a 50 y.o. male is here for Fatigue and Palpitations   Subjective: HPI:  The past 2 weeks he has felt fatigued. He feels like he gets good sleep but when he gets home around 5 PM he takes a nap wakes up a few hours later and still feels fatigued for the rest of the evening. He tells me nothing changed with his work other than the weather outside being a little more hot. He denies any change in his diet or weekend routine. Symptoms are present on a daily basis to moderate degree. He denies any weakness or motor or sensory disturbances other than that described below. Denies depression. No shortness of breath.  Over the weekend on Saturday night/Sunday morning had an episode where he awoke abruptly to a rapid heartbeat, feeling of panic and some numbness in his left arm. He was about to go to the emergency room however the symptoms subsided within a half an hour after he walked around the house to calm himself down. Otherwise he denies any irregular heartbeat but family members have multiple conductive abnormalities with their heart. He denies any exertional chest pain nor any shortness of breath with activity. He is not currently taking any medications. denies any fevers, chills or wheezing   Review Of Systems Outlined In HPI  Past Medical History:  Diagnosis Date  . Chronic back pain greater than 3 months duration 09/03/2011  . Esophageal reflux disease 09/03/2011  . Gastric ulcer due to Helicobacter pylori [id number]  . Peptic ulcer due to Helicobacter pylori [id number]    Past Surgical History:  Procedure Laterality Date  . HERNIA REPAIR    . LAPAROSCOPIC INGUINAL HERNIA REPAIR  04/23/2012   bilateral   Family History  Problem Relation Age of Onset  . Heart failure Mother     Social History   Social History  . Marital status: Married    Spouse name: N/A  . Number of children: N/A  . Years of education: N/A   Occupational History  . Not on file.   Social  History Main Topics  . Smoking status: Former Smoker    Quit date: 09/11/2000  . Smokeless tobacco: Not on file  . Alcohol use No  . Drug use: No  . Sexual activity: Not on file   Other Topics Concern  . Not on file   Social History Narrative  . No narrative on file     Objective: BP (!) 142/76   Pulse 71   Wt 163 lb (73.9 kg)   BMI 25.53 kg/m   General: Alert and Oriented, No Acute Distress HEENT: Pupils equal, round, reactive to light. Conjunctivae clear.  Moist membranes Lungs: Clear to auscultation bilaterally, no wheezing/ronchi/rales.  Comfortable work of breathing. Good air movement. Cardiac: Regular rate and rhythm. Normal S1/S2.  No murmurs, rubs, nor gallops.   Extremities: No peripheral edema.  Strong peripheral pulses.  Mental Status: No depression, anxiety, nor agitation. Skin: Warm and dry.  Assessment & Plan: Danny Cohen was seen today for fatigue and palpitations.  Diagnoses and all orders for this visit:  Other fatigue -     CBC -     TSH -     COMPLETE METABOLIC PANEL WITH GFR  Irregular heartbeat -     CBC -     TSH -     COMPLETE METABOLIC PANEL WITH GFR -     EKG 12-Lead   EKG: Was obtained showing normal sinus rhythm, normal  axis, no pathologic Q waves and no ST segment elevation or depression. Rule out anemia, thyroid abnormality or renal insufficiency causing electrolyte abnormality. Considering stress test. Signs and symptoms requring emergent/urgent reevaluation were discussed with the patient.   No Follow-up on file.

## 2016-04-13 ENCOUNTER — Telehealth: Payer: Self-pay | Admitting: Family Medicine

## 2016-04-13 DIAGNOSIS — R079 Chest pain, unspecified: Secondary | ICD-10-CM

## 2016-04-13 DIAGNOSIS — R200 Anesthesia of skin: Secondary | ICD-10-CM

## 2016-04-13 LAB — COMPLETE METABOLIC PANEL WITH GFR
ALT: 20 U/L (ref 9–46)
AST: 15 U/L (ref 10–40)
Albumin: 4.4 g/dL (ref 3.6–5.1)
Alkaline Phosphatase: 47 U/L (ref 40–115)
BUN: 13 mg/dL (ref 7–25)
CHLORIDE: 105 mmol/L (ref 98–110)
CO2: 27 mmol/L (ref 20–31)
Calcium: 9.3 mg/dL (ref 8.6–10.3)
Creat: 1.24 mg/dL (ref 0.60–1.35)
GFR, EST AFRICAN AMERICAN: 79 mL/min (ref >=60)
GFR, Est Non African American: 68 mL/min (ref >=60)
Glucose, Bld: 86 mg/dL (ref 65–99)
Potassium: 4.1 mmol/L (ref 3.5–5.3)
Sodium: 142 mmol/L (ref 135–146)
Total Bilirubin: 0.4 mg/dL (ref 0.2–1.2)
Total Protein: 6.8 g/dL (ref 6.1–8.1)

## 2016-04-13 LAB — CBC
HCT: 42.3 % (ref 38.5–50.0)
Hemoglobin: 14.1 g/dL (ref 13.2–17.1)
MCH: 30.1 pg (ref 27.0–33.0)
MCHC: 33.3 g/dL (ref 32.0–36.0)
MCV: 90.2 fL (ref 80.0–100.0)
MPV: 10.1 fL (ref 7.5–12.5)
PLATELETS: 273 10*3/uL (ref 140–400)
RBC: 4.69 MIL/uL (ref 4.20–5.80)
RDW: 13.7 % (ref 11.0–15.0)
WBC: 4.9 10*3/uL (ref 3.8–10.8)

## 2016-04-13 LAB — TSH: TSH: 0.74 m[IU]/L (ref 0.40–4.50)

## 2016-04-13 NOTE — Telephone Encounter (Signed)
We please let patient know that his blood work was normal. I would recommend that he get a stress test performed to check on his heart. An order was placed today, please contact my office if not notified about scheduling next week.

## 2016-04-13 NOTE — Telephone Encounter (Signed)
Recommendations left on vm 

## 2016-04-15 ENCOUNTER — Emergency Department (INDEPENDENT_AMBULATORY_CARE_PROVIDER_SITE_OTHER)
Admission: EM | Admit: 2016-04-15 | Discharge: 2016-04-15 | Disposition: A | Discharge status: Home / Self Care | Payer: BLUE CROSS/BLUE SHIELD | Source: Home / Self Care | Attending: Emergency Medicine | Admitting: Emergency Medicine

## 2016-04-15 ENCOUNTER — Encounter: Payer: Self-pay | Admitting: Emergency Medicine

## 2016-04-15 DIAGNOSIS — H109 Unspecified conjunctivitis: Secondary | ICD-10-CM | POA: Diagnosis not present

## 2016-04-15 DIAGNOSIS — S0501XA Injury of conjunctiva and corneal abrasion without foreign body, right eye, initial encounter: Secondary | ICD-10-CM

## 2016-04-15 MED ORDER — TOBRAMYCIN 0.3 % OP SOLN: 1.0000 [drp] | OPHTHALMIC | 0 refills | Status: DC

## 2016-04-15 NOTE — ED Provider Notes (Signed)
CSN: [id number]     Arrival date & time 04/15/16  1726 History   None    Chief Complaint  Patient presents with  . Eye Problem   (Consider location/radiation/quality/duration/timing/severity/associated sxs/prior Treatment) The history is provided by the patient. No language interpreter was used.  Eye Problem  Location:  Right eye Quality:  Aching Severity:  Mild Onset quality:  Sudden Timing:  Constant Progression:  Unchanged Chronicity:  New Context: foreign body   Foreign body:  Wood Relieved by:  Nothing Worsened by:  Nothing Ineffective treatments:  None tried Associated symptoms: no blurred vision   Risk factors: no conjunctival hemorrhage     Past Medical History:  Diagnosis Date  . Chronic back pain greater than 3 months duration 09/03/2011  . Esophageal reflux disease 09/03/2011  . Gastric ulcer due to Helicobacter pylori [id number]  . Peptic ulcer due to Helicobacter pylori [id number]   Past Surgical History:  Procedure Laterality Date  . HERNIA REPAIR    . LAPAROSCOPIC INGUINAL HERNIA REPAIR  04/23/2012   bilateral   Family History  Problem Relation Age of Onset  . Heart failure Mother    Social History  Substance Use Topics  . Smoking status: Former Smoker    Quit date: 09/11/2000  . Smokeless tobacco: Never Used  . Alcohol use No    Review of Systems  Eyes: Negative for blurred vision.  All other systems reviewed and are negative.   Allergies  Nsaids  Home Medications   Prior to Admission medications   Medication Sig Start Date End Date Taking? Authorizing Provider  ibuprofen (ADVIL,MOTRIN) 200 MG tablet Take 200 mg by mouth every 6 (six) hours as needed.    Historical Provider, MD  tobramycin (TOBREX) 0.3 % ophthalmic solution Place 1 drop into the right eye every 4 (four) hours. 04/15/16   Fransico Meadow, PA-C   Meds Ordered and Administered this Visit  Medications - No data to display  BP 107/68 (BP Location: Left Arm)   Pulse 66   Temp  98 F (36.7 C) (Oral)   Resp 16   Ht 5\' 7"  (1.702 m)   Wt 160 lb (72.6 kg)   SpO2 98%   BMI 25.06 kg/m  No data found.   Physical Exam  Constitutional: He appears well-developed and well-nourished.  HENT:  Head: Normocephalic.  Eyes: Conjunctivae and EOM are normal. Pupils are equal, round, and reactive to light.  fluro uptake lateral to iris at 9 oclock, linear streak.  Small cataract medial. (pt aware of cataract)   Musculoskeletal: Normal range of motion.  Neurological: He is alert.  Skin: Skin is warm.    Urgent Care Course   Clinical Course    Procedures (including critical care time)  Labs Review Labs Reviewed - No data to display  Imaging Review No results found.   Visual Acuity Review  Right Eye Distance: 25/20 Left Eye Distance: 20/20 Bilateral Distance:  (with corrective glasses)  Right Eye Near:   Left Eye Near:    Bilateral Near:         MDM   1. Corneal abrasion, right, initial encounter    tobrex See your eye doctor for recheck on Monday if symptoms have not resolved An After Visit Summary was printed and given to the patient.   [place], PA-C 04/15/16 [phone]

## 2016-04-15 NOTE — ED Triage Notes (Signed)
Patient was drilling wood project without protective eyewear one hour ago; felt something enter right eye which might have been wood or metal; rinsed eye several times but feels there is still some foreign body in it.

## 2016-04-17 DIAGNOSIS — S0501XA Injury of conjunctiva and corneal abrasion without foreign body, right eye, initial encounter: Secondary | ICD-10-CM | POA: Diagnosis not present

## 2016-04-25 ENCOUNTER — Encounter: Payer: Self-pay | Admitting: Family Medicine

## 2016-04-27 ENCOUNTER — Ambulatory Visit (INDEPENDENT_AMBULATORY_CARE_PROVIDER_SITE_OTHER): Payer: BLUE CROSS/BLUE SHIELD

## 2016-04-27 DIAGNOSIS — R208 Other disturbances of skin sensation: Secondary | ICD-10-CM | POA: Diagnosis not present

## 2016-04-27 DIAGNOSIS — R079 Chest pain, unspecified: Secondary | ICD-10-CM | POA: Diagnosis not present

## 2016-04-27 LAB — EXERCISE TOLERANCE TEST
CHL CUP MPHR: 172 {beats}/min
CHL CUP RESTING HR STRESS: 66 {beats}/min
CHL RATE OF PERCEIVED EXERTION: 17
CSEPEDS: 0 s
CSEPEW: 15.1 METS
Exercise duration (min): 13 min
Peak HR: 166 {beats}/min
Percent HR: 96 %

## 2016-04-28 ENCOUNTER — Telehealth: Payer: Self-pay | Admitting: Family Medicine

## 2016-04-28 DIAGNOSIS — G4719 Other hypersomnia: Secondary | ICD-10-CM

## 2016-04-28 DIAGNOSIS — G478 Other sleep disorders: Secondary | ICD-10-CM

## 2016-04-28 NOTE — Telephone Encounter (Signed)
Will you please let patient know that his exercise stress test was normal. I still can't find a reason for his fatigue but it looks like his heart is healthy. I will recommend a home sleep test as the next step in workup. I will get working on arranging this.

## 2016-04-28 NOTE — Telephone Encounter (Signed)
Left vm with results and recommendations

## 2016-05-02 ENCOUNTER — Telehealth: Payer: Self-pay | Admitting: Family Medicine

## 2016-05-02 DIAGNOSIS — G4719 Other hypersomnia: Secondary | ICD-10-CM

## 2016-05-02 DIAGNOSIS — G478 Other sleep disorders: Secondary | ICD-10-CM

## 2016-05-02 NOTE — Telephone Encounter (Signed)
Will you please let patient know that [employer] will not cover a home sleep test and they have requested that he have this done in a sleep lab, a referral has been sent to [facility] for this.

## 2016-05-02 NOTE — Telephone Encounter (Signed)
Left message on vm

## 2016-05-26 ENCOUNTER — Ambulatory Visit (HOSPITAL_BASED_OUTPATIENT_CLINIC_OR_DEPARTMENT_OTHER): Payer: BLUE CROSS/BLUE SHIELD | Attending: Family Medicine | Admitting: Internal Medicine

## 2016-05-26 VITALS — Ht 67.0 in | Wt 163.0 lb

## 2016-05-26 DIAGNOSIS — G478 Other sleep disorders: Secondary | ICD-10-CM | POA: Insufficient documentation

## 2016-05-26 DIAGNOSIS — G4719 Other hypersomnia: Secondary | ICD-10-CM | POA: Insufficient documentation

## 2016-05-26 DIAGNOSIS — R0683 Snoring: Secondary | ICD-10-CM

## 2016-05-26 DIAGNOSIS — G4733 Obstructive sleep apnea (adult) (pediatric): Secondary | ICD-10-CM

## 2016-06-03 DIAGNOSIS — G4733 Obstructive sleep apnea (adult) (pediatric): Secondary | ICD-10-CM | POA: Diagnosis not present

## 2016-06-03 NOTE — Procedures (Signed)
  Patient Name: Danny, Cohen Date: 05/26/2016 Gender: Male D.O.B: 06/02/1966 Age (years): 50 Referring Provider: Marcial Pacas Height (inches): 67 Interpreting Physician: Baird Lyons MD, ABSM Weight (lbs): 163 RPSGT: Jonna Coup BMI: 26 MRN: [id number] Neck Size: 15.50 CLINICAL INFORMATION Sleep Study Type: NPSG Indication for sleep study: Daytime Fatigue, Fatigue, Non-refreshing Sleep, Restless Sleep with Limb Movments, Snoring Epworth Sleepiness Score: 9  SLEEP STUDY TECHNIQUE As per the AASM Manual for the Scoring of Sleep and Associated Events v2.3 (April 2016) with a hypopnea requiring 4% desaturations. The channels recorded and monitored were frontal, central and occipital EEG, electrooculogram (EOG), submentalis EMG (chin), nasal and oral airflow, thoracic and abdominal wall motion, anterior tibialis EMG, snore microphone, electrocardiogram, and pulse oximetry.  MEDICATIONS Patient's medications include: charted for review. Medications self-administered by patient during sleep study : Advil. No sleep medicine administered.  SLEEP ARCHITECTURE The study was initiated at 10:39:40 PM and ended at 4:40:41 AM. Sleep onset time was 11.4 minutes and the sleep efficiency was 83.8%. The total sleep time was 302.5 minutes. Stage REM latency was 90.0 minutes. The patient spent 2.64% of the night in stage N1 sleep, 59.50% in stage N2 sleep, 0.00% in stage N3 and 37.85% in REM. Alpha intrusion was absent. Supine sleep was 45.45% . RESPIRATORY PARAMETERS The overall apnea/hypopnea index (AHI) was 0.2 per hour. There were 0 total apneas, including 0 obstructive, 0 central and 0 mixed apneas. There were 1 hypopneas and 0 RERAs. The AHI during Stage REM sleep was 0.0 per hour. AHI while supine was 0.4 per hour. The mean oxygen saturation was 95.57%. The minimum SpO2 during sleep was 91.00%. Loud snoring was noted during this study.  CARDIAC DATA The 2 lead EKG  demonstrated sinus rhythm. The mean heart rate was 52.20 beats per minute. Other EKG findings include: None . LEG MOVEMENT DATA The total PLMS were 0 with a resulting PLMS index of 0.00. Associated arousal with leg movement index was 0.0 .  IMPRESSIONS - No significant obstructive sleep apnea occurred during this study (AHI = 0.2/h). - No significant central sleep apnea occurred during this study (CAI = 0.0/h). - The patient had minimal or no oxygen desaturation during the study (Min O2 = 91.00%) - The patient snored with Loud snoring volume. - No cardiac abnormalities were noted during this study. - Clinically significant periodic limb movements did not occur during sleep. No significant associated arousals . DIAGNOSIS - Primary Snoring (786.09 [R06.83 ICD-10]) - Normal study  RECOMMENDATIONS - Avoid alcohol, sedatives and other CNS depressants that may worsen sleep apnea and disrupt normal sleep architecture. - Sleep hygiene should be reviewed to assess factors that may improve sleep quality. - Weight management and regular exercise should be initiated or continued if appropriate.  [Electronically signed] 06/03/2016 11:18 AM  Baird Lyons MD, ABSM Diplomate, American Board of Sleep Medicine   NPI: [id number]  [place], American Board of Sleep Medicine  ELECTRONICALLY SIGNED ON:  06/03/2016, 11:16 AM [place] PH: [phone]   FX: [phone] [place]

## 2016-07-11 NOTE — Progress Notes (Signed)
Call pt: sleep study was negative for central or obstructive sleep apnea.  Encouraged weight loss and good sleep routine.  Follow up in office with more tips concerns.

## 2016-07-11 NOTE — Progress Notes (Signed)
Please review so Pt can be informed of results and recommendation. Thank you.

## 2016-07-12 NOTE — Progress Notes (Signed)
Left VM for Pt to return clinic call regarding results.

## 2016-07-13 NOTE — Progress Notes (Signed)
Attempted to contact Pt today, no answer and now VM is full.

## 2016-08-19 IMAGING — CT CT ABD-PELV W/ CM
2 of 5 series · 16 of 46 positions shown, 18 images · IV contrast (APPLIED)
Comparison: 04/02/2014

CLINICAL DATA: Right and left lower quadrant abdominal pain with
diarrhea and nausea

EXAM:
CT ABDOMEN AND PELVIS WITH CONTRAST
TECHNIQUE: Multidetector CT imaging of the abdomen and pelvis was performed
using the standard protocol following bolus administration of
intravenous contrast.
CONTRAST:  100mL OMNIPAQUE IOHEXOL 300 MG/ML  SOLN

[Series 2: axial st · axial · 0.75mm/px · z∈[+565,+975]mm · 13 of 92 slices shown, 15 images]
[im 5/92  soft-tissue]
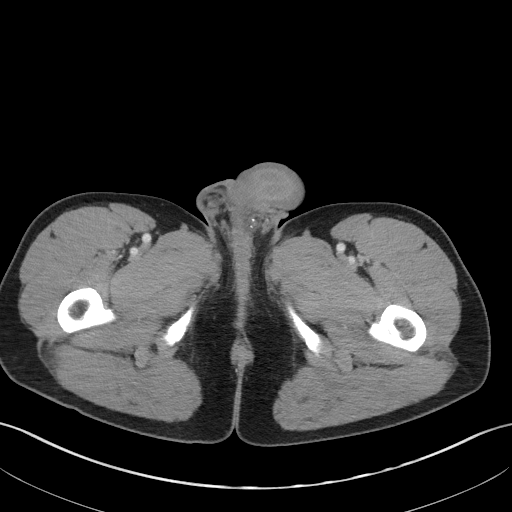
[im 5/92  bone]
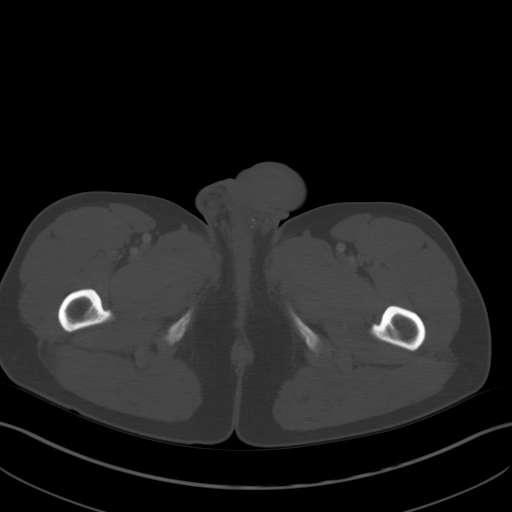
[im 14/92  soft-tissue]
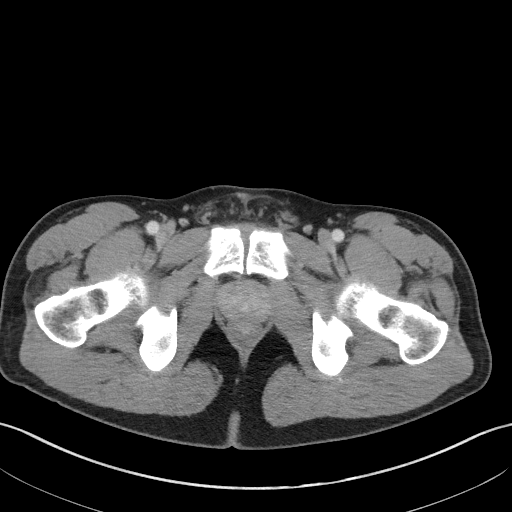
[im 19/92  soft-tissue]
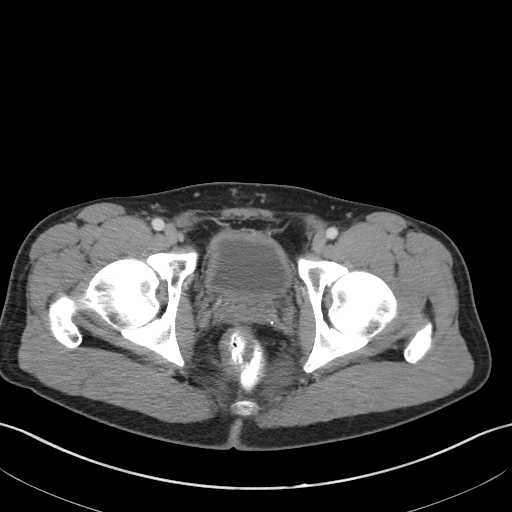
[im 28/92  soft-tissue]
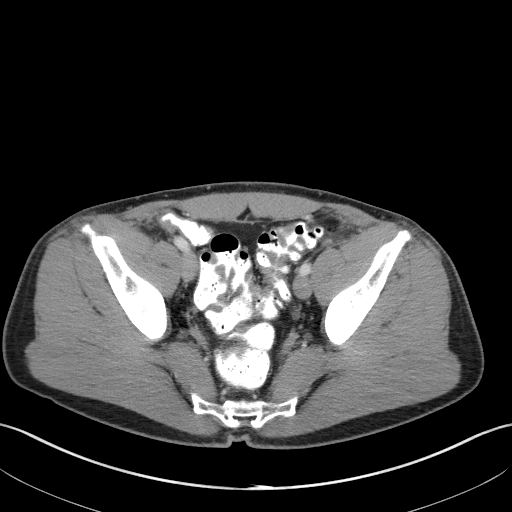
[im 32/92  soft-tissue]
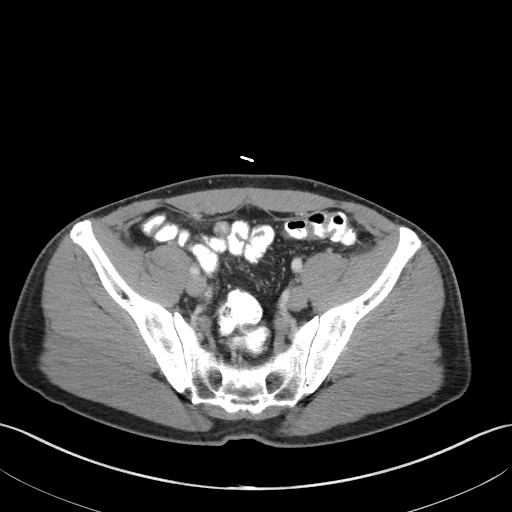
[im 41/92  soft-tissue]
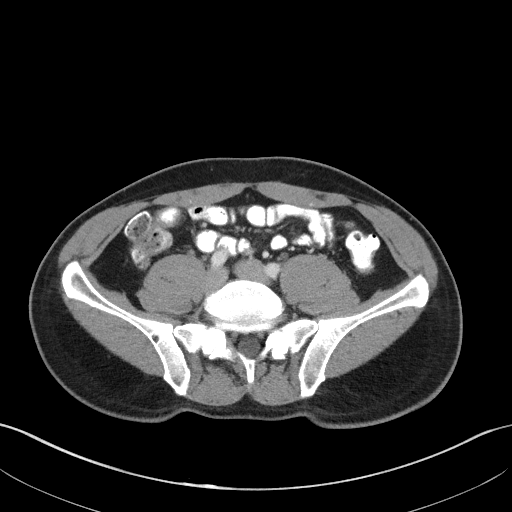
[im 46/92  soft-tissue]
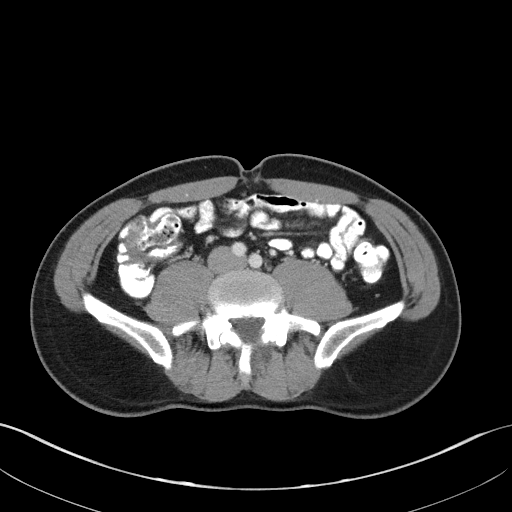
[im 51/92  soft-tissue]
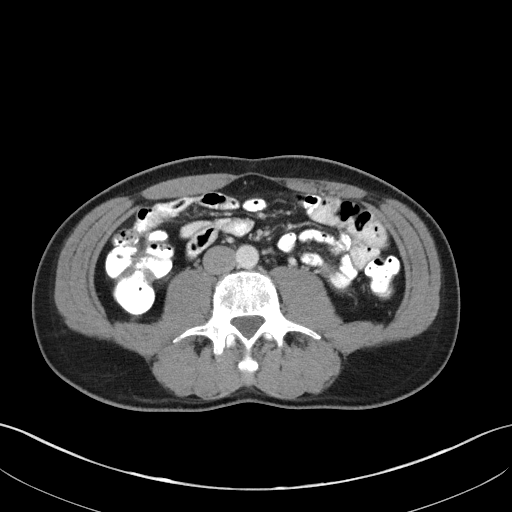
[im 60/92  soft-tissue]
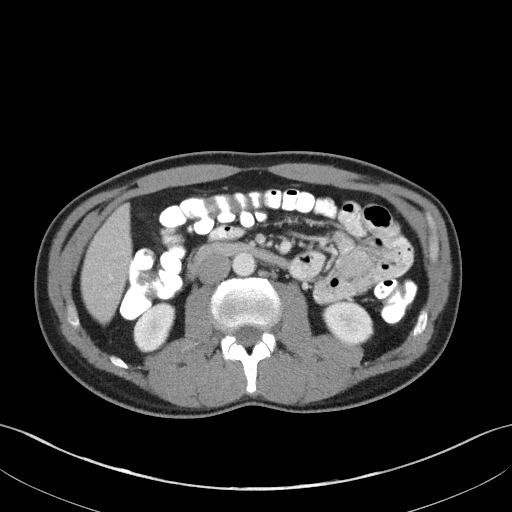
[im 60/92  bone]
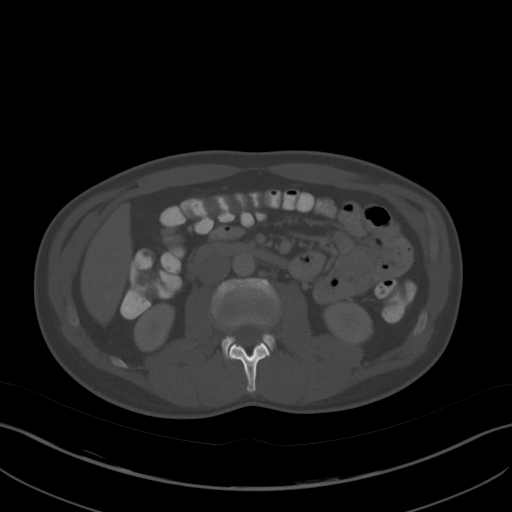
[im 64/92  soft-tissue]
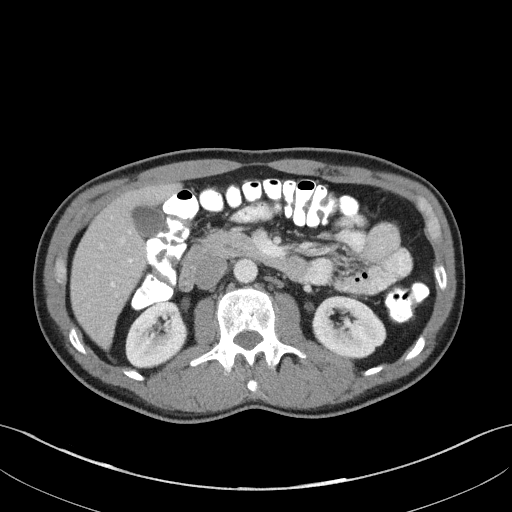
[im 73/92  soft-tissue]
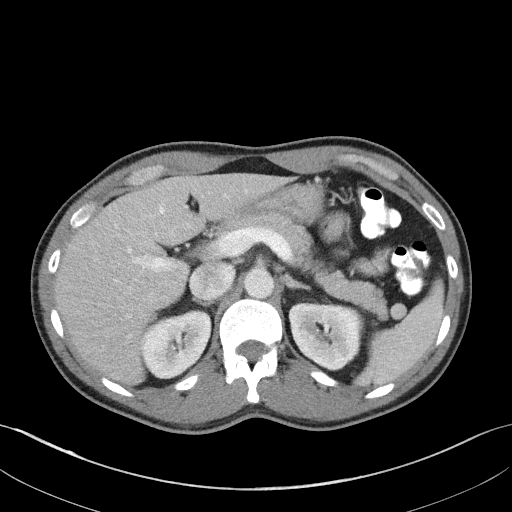
[im 78/92  soft-tissue]
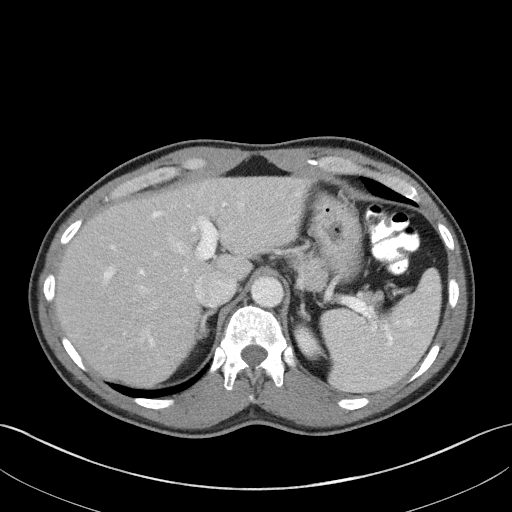
[im 87/92  soft-tissue]
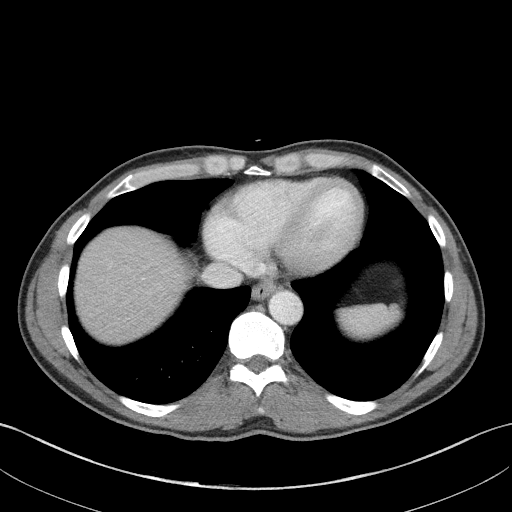

[Series 5: coronal st · coronal · 0.71mm/px · 3 of 78 slices shown]
[im 26/78  soft-tissue]
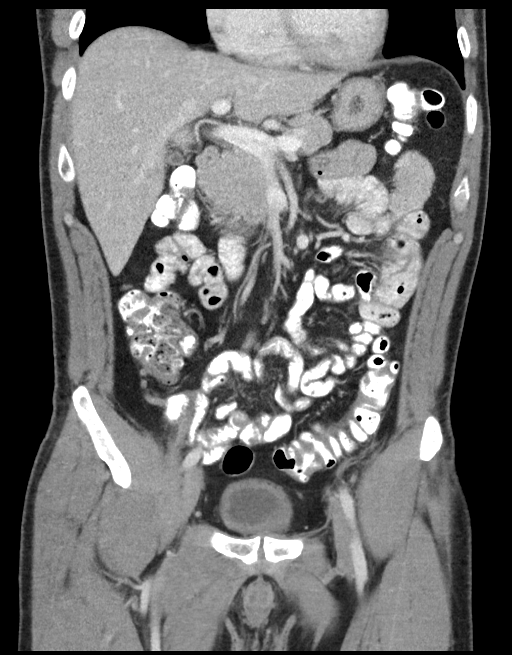
[im 35/78  soft-tissue]
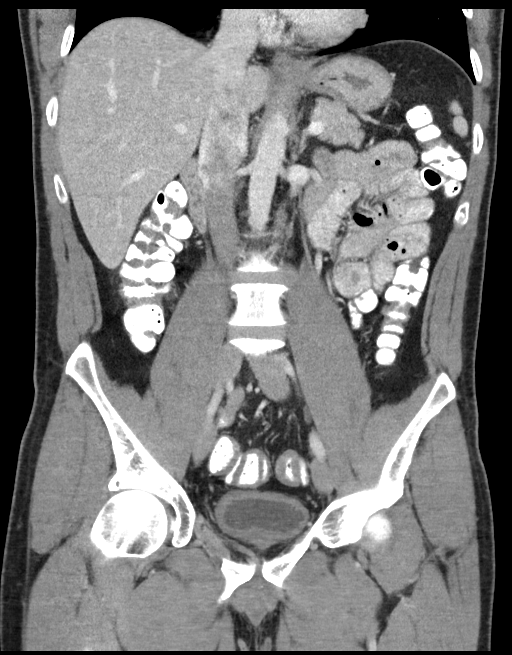
[im 43/78  soft-tissue]
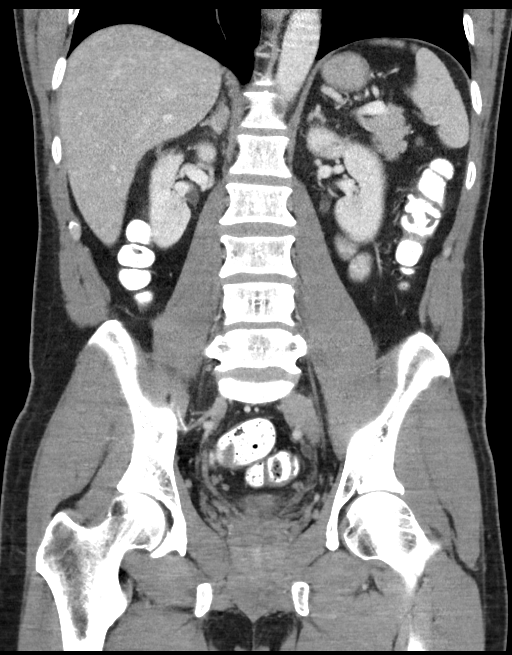

[16 of 46 positions shown; findings below may reference images not displayed]

FINDINGS: Lower chest and abdominal wall: Previous low inguinal/abdominal wall
hernia repair.

Hepatobiliary: 9 mm hypervascular lesion in the upper central liver
is stable and likely a flash fill hemangioma given discrete margins.
Ill-defined mass in segment 2 is also stable at approximately 3 cm,
again with peripheral nodular enhancement. Some what triangular area
of wispy enhancement in the subcapsular segment 7 is likely a
perfusion anomaly. No evidence of biliary obstruction or stone.

Pancreas: Unremarkable.

Spleen: Unremarkable.

Adrenals/Urinary Tract: Negative adrenals. Bilateral nonobstructive
nephrolithiasis. The largest stone is in the upper pole right kidney
measuring 3 mm. There are at least 4 calculi on the left (see
coronal reformats) and 3 on the right. No ureteral stone. Stable
appearance of the bladder.

Reproductive:No pathologic findings.

Stomach/Bowel: No obstruction or inflammatory change. No
appendicitis.

Vascular/Lymphatic: No acute vascular abnormality. No mass or
adenopathy.

Peritoneal: No ascites or pneumoperitoneum.

Musculoskeletal: Chronic L5 pars defects without anterolisthesis or
accelerated degeneration.
IMPRESSION: 1. No acute finding.
2. Bilateral nephrolithiasis.
3. Two lesions in the dome of the liver are stable from 5715 and
most consistent with hemangioma.

## 2016-12-18 ENCOUNTER — Emergency Department (INDEPENDENT_AMBULATORY_CARE_PROVIDER_SITE_OTHER)
Admission: EM | Admit: 2016-12-18 | Discharge: 2016-12-18 | Disposition: A | Discharge status: Home / Self Care | Payer: BLUE CROSS/BLUE SHIELD | Source: Home / Self Care | Attending: Family Medicine | Admitting: Family Medicine

## 2016-12-18 ENCOUNTER — Encounter: Payer: Self-pay | Admitting: Emergency Medicine

## 2016-12-18 ENCOUNTER — Emergency Department (INDEPENDENT_AMBULATORY_CARE_PROVIDER_SITE_OTHER): Payer: BLUE CROSS/BLUE SHIELD

## 2016-12-18 DIAGNOSIS — R079 Chest pain, unspecified: Secondary | ICD-10-CM

## 2016-12-18 DIAGNOSIS — M546 Pain in thoracic spine: Secondary | ICD-10-CM | POA: Diagnosis not present

## 2016-12-18 DIAGNOSIS — M549 Dorsalgia, unspecified: Secondary | ICD-10-CM

## 2016-12-18 DIAGNOSIS — R0781 Pleurodynia: Secondary | ICD-10-CM

## 2016-12-18 DIAGNOSIS — S299XXA Unspecified injury of thorax, initial encounter: Secondary | ICD-10-CM | POA: Diagnosis not present
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
## 2016-12-18 NOTE — ED Triage Notes (Signed)
Pt states he fell and landed on right side x2 weeks ago. c/o rib pain that is constant but worse with movement. Using heat and motrin.

## 2017-02-11 ENCOUNTER — Emergency Department (INDEPENDENT_AMBULATORY_CARE_PROVIDER_SITE_OTHER)
Admission: EM | Admit: 2017-02-11 | Discharge: 2017-02-11 | Disposition: A | Discharge status: Home / Self Care | Payer: BLUE CROSS/BLUE SHIELD | Source: Home / Self Care | Attending: Family Medicine | Admitting: Family Medicine

## 2017-02-11 ENCOUNTER — Encounter: Payer: Self-pay | Admitting: Emergency Medicine

## 2017-02-11 DIAGNOSIS — K029 Dental caries, unspecified: Secondary | ICD-10-CM

## 2017-02-11 DIAGNOSIS — K047 Periapical abscess without sinus: Secondary | ICD-10-CM

## 2017-02-11 MED ORDER — HYDROCODONE-ACETAMINOPHEN 5-325 MG PO TABS: 1.0000 | ORAL_TABLET | Freq: Four times a day (QID) | ORAL | 0 refills | Status: DC | PRN

## 2017-02-11 MED ORDER — AMOXICILLIN-POT CLAVULANATE 875-125 MG PO TABS: 1.0000 | ORAL_TABLET | Freq: Two times a day (BID) | ORAL | 0 refills | Status: DC

## 2017-02-11 NOTE — ED Provider Notes (Signed)
CSN: [id number]     Arrival date & time 02/11/17  1217 History   First MD Initiated Contact with Patient 02/11/17 1305     Chief Complaint  Patient presents with  . Dental Pain   (Consider location/radiation/quality/duration/timing/severity/associated sxs/prior Treatment) HPI  Danny Cohen is a 51 y.o. male presenting to UC with c/o 2-3 days of worsening Right frontal upper tooth pain that started to swell this morning. Pain is aching and throbbing. No prior issues with his tooth before.  He plans to call his dentist in the morning but wanted to see if he could get an antibiotic in the meantime.  He has been taking ibuprofen 800-1000mg  every 4-6 hours to dull the pain.  Denies fever, chills, n/v/d.    Past Medical History:  Diagnosis Date  . Chronic back pain greater than 3 months duration 09/03/2011  . Esophageal reflux disease 09/03/2011  . Gastric ulcer due to Helicobacter pylori 92/07/9416  . Peptic ulcer due to Helicobacter pylori 40/81/4481   Past Surgical History:  Procedure Laterality Date  . HERNIA REPAIR    . LAPAROSCOPIC INGUINAL HERNIA REPAIR  04/23/2012   bilateral   Family History  Problem Relation Age of Onset  . Heart failure Mother    Social History  Substance Use Topics  . Smoking status: Former Smoker    Quit date: 09/11/2000  . Smokeless tobacco: Never Used  . Alcohol use No    Review of Systems  Constitutional: Negative for chills and fever.  HENT: Positive for dental problem, sinus pain and sinus pressure. Negative for sore throat.   Gastrointestinal: Negative for nausea and vomiting.    Allergies  Nsaids  Home Medications   Prior to Admission medications   Medication Sig Start Date End Date Taking? Authorizing Provider  amoxicillin-clavulanate (AUGMENTIN) 875-125 MG tablet Take 1 tablet by mouth 2 (two) times daily. One po bid x 7 days 02/11/17   Noland Fordyce, PA-C  HYDROcodone-acetaminophen (NORCO/VICODIN) 5-325 MG tablet Take 1-2 tablets by  mouth every 6 (six) hours as needed for moderate pain or severe pain. 02/11/17   Noland Fordyce, PA-C  ibuprofen (ADVIL,MOTRIN) 200 MG tablet Take 200 mg by mouth every 6 (six) hours as needed.    [provider]  tobramycin (TOBREX) 0.3 % ophthalmic solution Place 1 drop into the right eye every 4 (four) hours. 04/15/16   Fransico Meadow, PA-C   Meds Ordered and Administered this Visit  Medications - No data to display  BP 114/75 (BP Location: Left Arm)   Pulse 63   Temp 98.4 F (36.9 C) (Oral)   Resp 16   Ht 5\' 7"  (1.702 m)   Wt 152 lb 8 oz (69.2 kg)   SpO2 98%   BMI 23.88 kg/m  No data found.   Physical Exam  Constitutional: He is oriented to person, place, and time. He appears well-developed and well-nourished.  HENT:  Head: Normocephalic and atraumatic.    Nose: Nose normal.  Mouth/Throat: Uvula is midline and oropharynx is clear and moist. No oral lesions. No trismus in the jaw. Dental abscesses and dental caries present. No uvula swelling.  Tenderness and swelling of Right side of face and Right upper gingiva. No bleeding or discharge. No chipped tooth visible.   Eyes: EOM are normal.  Neck: Normal range of motion.  Cardiovascular: Normal rate.   Pulmonary/Chest: Effort normal.  Musculoskeletal: Normal range of motion.  Neurological: He is alert and oriented to person, place, and time.  Skin: Skin is warm and dry.  Psychiatric: He has a normal mood and affect. His behavior is normal.  Nursing note and vitals reviewed.   Urgent Care Course     Procedures (including critical care time)  Labs Review Labs Reviewed - No data to display  Imaging Review No results found.    MDM   1. Pain due to dental caries   2. Dental abscess    Rx: Augmentin and Norco Advised not to take more than 600mg  ibuprofen every 6-8 hours for pain, especially due to hx of stomach ulcers.  Encouraged to call dentist tomorrow as planned.    Noland Fordyce, PA-C 02/11/17  1420

## 2017-02-11 NOTE — ED Triage Notes (Signed)
Patient presents to [facility] with dental pain since Friday PM. Rates pain 4/10 throbbing, slight edema facial area.

## 2017-02-11 NOTE — Discharge Instructions (Signed)
°  Norco/Vicodin (hydrocodone-acetaminophen) is a narcotic pain medication, do not combine these medications with others containing tylenol. While taking, do not drink alcohol, drive, or perform any other activities that requires focus while taking these medications.  ° °

## 2017-09-07 ENCOUNTER — Ambulatory Visit: Payer: BLUE CROSS/BLUE SHIELD | Admitting: Family Medicine

## 2017-09-07 ENCOUNTER — Encounter: Payer: Self-pay | Admitting: Family Medicine

## 2017-09-07 VITALS — BP 111/74 | HR 73 | Ht 67.0 in | Wt 157.0 lb

## 2017-09-07 DIAGNOSIS — S3981XA Other specified injuries of abdomen, initial encounter: Secondary | ICD-10-CM | POA: Diagnosis not present

## 2017-09-07 DIAGNOSIS — R1031 Right lower quadrant pain: Secondary | ICD-10-CM

## 2017-09-07 NOTE — Progress Notes (Signed)
Danny Cohen is a 51 y.o. male who presents to [facility] today for groin pain. Danny Cohen notes pain in the right groin ongoing for years. He has a history of bilateral inguinal hernia repair several years ago. He notes that the pain in the right groin is worsening recently.  He notes the pain is somewhat interfering with his ability to work.  He said evaluations with 2 different urologist who did not suspect a urologic issue.  Pain is worse with activity and better with rest.  He denies any bowel or bladder dysfunction fevers or chills.  He is tried over-the-counter medications which have not helped much.   Past Medical History:  Diagnosis Date  . Chronic back pain greater than 3 months duration 09/03/2011  . Esophageal reflux disease 09/03/2011  . Gastric ulcer due to Helicobacter pylori 19/62/2297  . Peptic ulcer due to Helicobacter pylori 98/92/1194   Past Surgical History:  Procedure Laterality Date  . HERNIA REPAIR    . LAPAROSCOPIC INGUINAL HERNIA REPAIR  04/23/2012   bilateral   Social History   Tobacco Use  . Smoking status: Former Smoker    Last attempt to quit: 09/11/2000    Years since quitting: 17.0  . Smokeless tobacco: Never Used  Substance Use Topics  . Alcohol use: No     ROS:  As above   Medications: Current Outpatient Medications  Medication Sig Dispense Refill  . ibuprofen (ADVIL,MOTRIN) 200 MG tablet Take 200 mg by mouth every 6 (six) hours as needed.     No current facility-administered medications for this visit.    Allergies  Allergen Reactions  . Nsaids Other (See Comments)    Pt has history of peptic ulcer      Exam:  BP 111/74   Pulse 73   Ht 5\' 7"  (1.702 m)   Wt 157 lb (71.2 kg)   BMI 24.59 kg/m  General: Well Developed, well nourished, and in no acute distress.  Neuro/Psych: Alert and oriented x3, extra-ocular muscles intact, able to move all 4 extremities, sensation grossly intact. Skin:  Warm and dry, no rashes noted.  Respiratory: Not using accessory muscles, speaking in full sentences, trachea midline.  Cardiovascular: Pulses palpable, no extremity edema. Abdomen: Does not appear distended.  No masses palpated.  No obvious herniation present.  No rebound tenderness Genital exam reveals testicles descended bilaterally with no herniation present.  Spermatic cord is nontender.  No inguinal hernia present at the external inguinal ring.  No inguinal lymphadenopathy present MSK:  Tender to palpation at the lateral corner of the right side of the pubic bone.  This is tender at the insertion of the oblique.  Pain is improved with abdominal wall flexion and worse with abdominal wall relaxation.  No masses are palpated. Hip motion is normal and resisted hip motion is nonpainful and normal Normal gait     No results found for this or any previous visit (from the past 48 hour(s)). No results found.    Assessment and Plan: 51 y.o. male with  Groin pain concerning for a sports hernia.  Differential also includes osteitis pubis, mesh issue or entrapped nerve.  Plan for follow-up after MRI.  Recheck in the near future as needed.    Orders Placed This Encounter  Procedures  . MR HIP RIGHT WO CONTRAST    Standing Status:   Future    Standing Expiration Date:   11/08/2018    Scheduling Instructions:  Sports hernia protocol    Order Specific Question:   What is the patient's sedation requirement?    Answer:   No Sedation    Order Specific Question:   Does the patient have a pacemaker or implanted devices?    Answer:   No    Order Specific Question:   Preferred imaging location?    Answer:   [employer]/process development scientist (table limit-350lbs)    Order Specific Question:   Radiology Contrast Protocol - do NOT remove file path    Answer:   [email or web address]    Order Specific Question:   Reason for Exam additional comments    Answer:   Right groin pain  sports hernia protocol   No orders of the defined types were placed in this encounter.   Discussed warning signs or symptoms. Please see discharge instructions. Patient expresses understanding.  I spent 25 minutes with this patient, greater than 50% was face-to-face time counseling regarding ddx and treatment plan.

## 2017-09-07 NOTE — Patient Instructions (Signed)
Thank you for coming in today. You should hear soon about MRI likely.  Recheck after CT or MRI.

## 2017-09-17 ENCOUNTER — Ambulatory Visit (INDEPENDENT_AMBULATORY_CARE_PROVIDER_SITE_OTHER): Payer: BLUE CROSS/BLUE SHIELD

## 2017-09-17 DIAGNOSIS — N433 Hydrocele, unspecified: Secondary | ICD-10-CM | POA: Diagnosis not present

## 2017-09-17 DIAGNOSIS — S3981XA Other specified injuries of abdomen, initial encounter: Secondary | ICD-10-CM

## 2017-09-17 DIAGNOSIS — M25551 Pain in right hip: Secondary | ICD-10-CM | POA: Diagnosis not present

## 2017-09-26 ENCOUNTER — Ambulatory Visit: Payer: BLUE CROSS/BLUE SHIELD | Admitting: Family Medicine

## 2017-09-26 ENCOUNTER — Encounter: Payer: Self-pay | Admitting: Family Medicine

## 2017-09-26 ENCOUNTER — Telehealth: Payer: Self-pay | Admitting: Family Medicine

## 2017-09-26 VITALS — BP 110/63 | HR 69 | Ht 67.0 in | Wt 160.0 lb

## 2017-09-26 DIAGNOSIS — N433 Hydrocele, unspecified: Secondary | ICD-10-CM | POA: Diagnosis not present

## 2017-09-26 NOTE — Telephone Encounter (Signed)
Referral to urology placed

## 2017-09-26 NOTE — Patient Instructions (Signed)
Thank you for coming in today. Follow up with urology.  Recheck as needed.  I think the pain is due to the hydrocele.    Hydrocele, Adult A hydrocele is a collection of fluid in the loose pouch of skin that holds the testicles (scrotum). Usually, it affects only one testicle. What are the causes? This condition may be caused by:  An injury to the scrotum.  An infection.  A tumor or cancer of the testicle.  Twisting of a testicle.  Decreased blood flow to the scrotum.  What are the signs or symptoms? A hydrocele feels like a water-filled balloon. It may also feel heavy. A hydrocele can cause:  Swelling of the scrotum. The swelling may decrease when you lie down.  Swelling of the groin.  Mild discomfort in the scrotum.  Pain. This can develop if the hydrocele was caused by infection or twisting.  How is this diagnosed? This condition may be diagnosed with a medical history, physical exam, and imaging tests. You may also have blood and urine tests to check for infection. How is this treated? Treatment may include:  Watching and waiting, particularly if the hydrocele causes no symptoms.  Treatment of the underlying condition. This may include using antibiotic medicine.  Surgery to drain the fluid. Some surgical options include: ? Needle aspiration. For this procedure, a needle is used to drain fluid. ? Hydrocelectomy. For this procedure, an incision is made in the scrotum to remove the fluid sac.  Follow these instructions at home:  Keep all follow-up visits as told by your health care provider. This is important.  Watch the hydrocele for any changes.  Take over-the-counter and prescription medicines only as told by your health care provider.  If you were prescribed an antibiotic medicine, use it as told by your health care provider. Do not stop using the antibiotic even if your condition improves. Contact a health care provider if:  The swelling in your scrotum  or groin gets worse.  The hydrocele becomes red, firm, tender to the touch, or painful.  You notice any changes in the hydrocele.  You have a fever. This information is not intended to replace advice given to you by your health care provider. Make sure you discuss any questions you have with your health care provider. Document Released: 02/15/2010 Document Revised: 02/03/2016 Document Reviewed: 08/24/2014 Elsevier Interactive Patient Education  [employer].

## 2017-09-26 NOTE — Progress Notes (Signed)
Danny Cohen is a 52 y.o. male who presents to [facility]: [place] today for right groin pain.  Danny Cohen has a several year history of pain in the right groin.  He has a palpable tender nodule at the right scrotum.  He has had multiple evaluations for this and has a history of inguinal hernia repair bilaterally in 2013.  He notes that he feels sharp stabbing pain into his lower abdomen several times per day.  The pain is sometimes disabling.  He has had evaluation with urology in the past which was not found to be very helpful.  He denies any urinary symptoms or problems with bowel or bladder.  No fevers or chills.  I saw him for this issue on September 07, 2017.  Given the history I was suspicious for either nerve entrapment or possible sports hernia as I did not appreciate an inguinal hernia during that exam.  Additionally during that exam I did not appreciate any scrotal masses or tenderness.  After discussion we plan for MRI hip with sports hernia protocol.  He is here today for follow-up.   Past Medical History:  Diagnosis Date  . Chronic back pain greater than 3 months duration 09/03/2011  . Esophageal reflux disease 09/03/2011  . Gastric ulcer due to Helicobacter pylori 54/27/0623  . Peptic ulcer due to Helicobacter pylori 76/28/3151   Past Surgical History:  Procedure Laterality Date  . HERNIA REPAIR    . LAPAROSCOPIC INGUINAL HERNIA REPAIR  04/23/2012   bilateral   Social History   Tobacco Use  . Smoking status: Former Smoker    Last attempt to quit: 09/11/2000    Years since quitting: 17.0  . Smokeless tobacco: Never Used  Substance Use Topics  . Alcohol use: No   family history includes Heart failure in his mother.  ROS as above:  Medications: Current Outpatient Medications  Medication Sig Dispense Refill  . ibuprofen (ADVIL,MOTRIN) 200 MG tablet Take 200 mg by  mouth every 6 (six) hours as needed.     No current facility-administered medications for this visit.    Allergies  Allergen Reactions  . Nsaids Other (See Comments)    Pt has history of peptic ulcer     Health Maintenance Health Maintenance  Topic Date Due  . HIV Screening  01/05/1983  . INFLUENZA VACCINE  04/11/2017  . TETANUS/TDAP  08/30/2021     Exam:  BP 110/63   Pulse 69   Ht 5\' 7"  (1.702 m)   Wt 160 lb (72.6 kg)   BMI 25.06 kg/m  Gen: Well NAD HEENT: EOMI,  MMM Lungs: Normal work of breathing. CTABL Heart: RRR no MRG Abd: NABS, Soft. Nondistended, Nontender Exts: Brisk capillary refill, warm and well perfused.  Genital exam reveals testicles descended bilaterally and nontender.  He does have a small palpable nodule in the area of the hydrocele seen on that is tender and reproduces his pain.   CLINICAL DATA:  Right groin pain.  Prior history of hernia repair.  EXAM: MR OF THE RIGHT HIP WITHOUT CONTRAST  TECHNIQUE: Multiplanar, multisequence MR imaging was performed. No intravenous contrast was administered.  COMPARISON:  CT scan 09/24/2015  FINDINGS: No acute bony findings. No marrow edema, stress fracture or bone lesion. Both hips are normally located. No evidence of avascular necrosis. No hip joint effusion. The pubic symphysis and SI joints are intact. The sacral neural foramen appear patent.  The surrounding hip  and pelvic musculature appear normal. No muscle tear, myositis or mass. The rectus muscles are normal. No findings for athletic pubalgia.  No significant intrapelvic abnormalities. No inguinal mass or inguinal hernia. Small right scrotal hydrocele noted.  IMPRESSION: Unremarkable MR examination of the pelvis.   Electronically Signed   By: Marijo Sanes M.D.   On: 09/17/2017 14:41    Assessment and Plan: 52 y.o. male with right groin pain very likely due to right-sided hydrocele.  The tender nodule that Danny Cohen is  complaining of is very likely hydrocele seen on MRI.  I suspect the hydrocele is likely compressing a nerve that is causing the pain.  Danny Cohen at this point Danny Cohen certainly has failed conservative management and I think is reasonable to consider surgical management.  Recommend follow back up with urology with MRI report and images.  If not helpful I am happy to provide a referral.   No orders of the defined types were placed in this encounter.  No orders of the defined types were placed in this encounter.    Discussed warning signs or symptoms. Please see discharge instructions. Patient expresses understanding.  I spent 25 minutes with this patient, greater than 50% was face-to-face time counseling regarding ddx and treatment plan.

## 2017-09-26 NOTE — Telephone Encounter (Signed)
Patient called request to get a referral to Urology in [facility] (Dr. Barnie Del) [phone]. Thanks

## 2017-10-15 DIAGNOSIS — N509 Disorder of male genital organs, unspecified: Secondary | ICD-10-CM | POA: Diagnosis not present

## 2017-10-15 DIAGNOSIS — N433 Hydrocele, unspecified: Secondary | ICD-10-CM | POA: Diagnosis not present

## 2017-10-15 DIAGNOSIS — I878 Other specified disorders of veins: Secondary | ICD-10-CM | POA: Diagnosis not present

## 2018-04-24 DIAGNOSIS — H16041 Marginal corneal ulcer, right eye: Secondary | ICD-10-CM | POA: Diagnosis not present

## 2018-05-01 DIAGNOSIS — S0501XD Injury of conjunctiva and corneal abrasion without foreign body, right eye, subsequent encounter: Secondary | ICD-10-CM | POA: Diagnosis not present

## 2018-05-08 DIAGNOSIS — H16141 Punctate keratitis, right eye: Secondary | ICD-10-CM | POA: Diagnosis not present

## 2018-05-15 DIAGNOSIS — H16101 Unspecified superficial keratitis, right eye: Secondary | ICD-10-CM | POA: Diagnosis not present

## 2018-05-22 DIAGNOSIS — H04129 Dry eye syndrome of unspecified lacrimal gland: Secondary | ICD-10-CM | POA: Diagnosis not present

## 2018-06-25 DIAGNOSIS — H1045 Other chronic allergic conjunctivitis: Secondary | ICD-10-CM | POA: Diagnosis not present

## 2018-07-09 DIAGNOSIS — H1045 Other chronic allergic conjunctivitis: Secondary | ICD-10-CM | POA: Diagnosis not present

## 2018-12-26 ENCOUNTER — Ambulatory Visit (INDEPENDENT_AMBULATORY_CARE_PROVIDER_SITE_OTHER): Payer: BLUE CROSS/BLUE SHIELD | Admitting: Family Medicine

## 2018-12-26 ENCOUNTER — Encounter: Payer: Self-pay | Admitting: Family Medicine

## 2018-12-26 VITALS — Wt 160.0 lb

## 2018-12-26 DIAGNOSIS — L237 Allergic contact dermatitis due to plants, except food: Secondary | ICD-10-CM

## 2018-12-26 MED ORDER — PREDNISONE 5 MG (48) PO TBPK: ORAL_TABLET | ORAL | 0 refills | Status: DC

## 2018-12-26 MED ORDER — TRIAMCINOLONE ACETONIDE 0.1 % EX CREA: 1.0000 "application " | TOPICAL_CREAM | Freq: Two times a day (BID) | CUTANEOUS | 1 refills | Status: DC

## 2018-12-26 NOTE — Progress Notes (Signed)
Virtual Visit  via Phone Note  I connected with      Danny Cohen on 11/29/18 at 120 by a telemedicine application and verified that I am speaking with the correct person using two identifiers.   I discussed the limitations of evaluation and management by telemedicine and the availability of in person appointments. The patient expressed understanding and agreed to proceed.  History of Present Illness: Danny Cohen is a 53 y.o. male who would like to discuss poison ivy   Exposed to poison ivy on Saturday.  Tried to clean up with dish soap but woke up Sunday morning with a rash on right hand.  Tried treating with calamine lotion.  He notes spread to left leg and hand.  His symptoms are consistent with previous episodes of poison ivy.  He notes that he is quite sensitive to poison ivy and this is a pretty typical outbreak for him.  He feels well otherwise with no fevers chills nausea vomiting or diarrhea.    Observations/Objective: Wt 160 lb (72.6 kg)   BMI 25.06 kg/m  Wt Readings from Last 5 Encounters:  12/26/18 160 lb (72.6 kg)  09/26/17 160 lb (72.6 kg)  09/07/17 157 lb (71.2 kg)  02/11/17 152 lb 8 oz (69.2 kg)  12/18/16 166 lb (75.3 kg)   Exam: Normal Speech.  Patient will provide pictures of his rash in the near future  Lab and Radiology Results No results found for this or any previous visit (from the past 72 hour(s)). No results found.   Assessment and Plan: 53 y.o. male with poison ivy dermatitis.  Typical outbreak for [place].  Plan to treat with topical triamcinolone cream.  If not improving I have also prescribed a prednisone Dosepak that he can start taking however due to COVID-19 would like to minimize systemic steroids if possible.  Prednisone is a backup plan.  Recheck with me as needed in the future.  PDMP not reviewed this encounter. No orders of the defined types were placed in this encounter.  Meds ordered this encounter  Medications  .  triamcinolone cream (KENALOG) 0.1 %    Sig: Apply 1 application topically 2 (two) times daily.    Dispense:  453.6 g    Refill:  1  . predniSONE (STERAPRED UNI-PAK 48 TAB) 5 MG (48) TBPK tablet    Sig: 12 day dosepack po    Dispense:  48 tablet    Refill:  0    Follow Up Instructions:    I discussed the assessment and treatment plan with the patient. The patient was provided an opportunity to ask questions and all were answered. The patient agreed with the plan and demonstrated an understanding of the instructions.   The patient was advised to call back or seek an in-person evaluation if the symptoms worsen or if the condition fails to improve as anticipated.  I provided 15 minutes of non-face-to-face time during this encounter.    Historical information moved to improve visibility of documentation.  Past Medical History:  Diagnosis Date  . Chronic back pain greater than 3 months duration 09/03/2011  . Esophageal reflux disease 09/03/2011  . Gastric ulcer due to Helicobacter pylori 03/00/9233  . Peptic ulcer due to Helicobacter pylori 00/76/2263   Past Surgical History:  Procedure Laterality Date  . HERNIA REPAIR    . LAPAROSCOPIC INGUINAL HERNIA REPAIR  04/23/2012   bilateral   Social History   Tobacco Use  . Smoking status: Former Smoker  Last attempt to quit: 09/11/2000    Years since quitting: 18.3  . Smokeless tobacco: Never Used  Substance Use Topics  . Alcohol use: No   family history includes Heart failure in his mother.  Medications: Current Outpatient Medications  Medication Sig Dispense Refill  . ibuprofen (ADVIL,MOTRIN) 200 MG tablet Take 200 mg by mouth every 6 (six) hours as needed.    . predniSONE (STERAPRED UNI-PAK 48 TAB) 5 MG (48) TBPK tablet 12 day dosepack po 48 tablet 0  . triamcinolone cream (KENALOG) 0.1 % Apply 1 application topically 2 (two) times daily. 453.6 g 1   No current facility-administered medications for this visit.     Allergies  Allergen Reactions  . Nsaids Other (See Comments)    Pt has history of peptic ulcer

## 2018-12-26 NOTE — Patient Instructions (Addendum)
Thank you for coming in today.  Apply the triamcinolone cream to the rash 2-3 times daily until resolved.  Okay to also use calamine lotion or [place] itch for temporary control of itching. Okay to also use 1 to 2 pills of Benadryl at bedtime as needed for temporary control of itching as well. If not improving start taking the prednisone course as that will settle it down significantly but will may cause your immune system to be a bit suppressed.   Poison Ivy Dermatitis  Poison ivy dermatitis is inflammation of the skin that is caused by the allergens on the leaves of the poison ivy plant. The skin reaction often involves redness, swelling, blisters, and extreme itching. What are the causes? This condition is caused by a specific chemical (urushiol) found in the sap of the poison ivy plant. This chemical is sticky and can be easily spread to people, animals, and objects. You can get poison ivy dermatitis by:  Having direct contact with a poison ivy plant.  Touching animals, other people, or objects that have come in contact with poison ivy and have the chemical on them. What increases the risk? This condition is more likely to develop in:  People who are outdoors often.  People who go outdoors without wearing protective clothing, such as closed shoes, long pants, and a long-sleeved shirt. What are the signs or symptoms? Symptoms of this condition include:  Redness and itching.  A rash that often includes bumps and blisters. The rash usually appears 48 hours after exposure.  Swelling. This may occur if the reaction is more severe. Symptoms usually last for 1-2 weeks. However, the first time you develop this condition, symptoms may last 3-4 weeks. How is this diagnosed? This condition may be diagnosed based on your symptoms and a physical exam. Your health care provider may also ask you about any recent outdoor activity. How is this treated? Treatment for this condition will vary  depending on how severe it is. Treatment may include:  Hydrocortisone creams or calamine lotions to relieve itching.  Oatmeal baths to soothe the skin.  Over-the-counter antihistamine tablets.  Oral steroid medicine for more severe outbreaks. Follow these instructions at home:  Take or apply over-the-counter and prescription medicines only as told by your health care provider.  Wash exposed skin as soon as possible with soap and cold water.  Use hydrocortisone creams or calamine lotion as needed to soothe the skin and relieve itching.  Take oatmeal baths as needed. Use colloidal oatmeal. You can get this at your local pharmacy or grocery store. Follow the instructions on the packaging.  Do not scratch or rub your skin.  While you have the rash, wash clothes right after you wear them. How is this prevented?   Learn to identify the poison ivy plant and avoid contact with the plant. This plant can be recognized by the number of leaves. Generally, poison ivy has three leaves with flowering branches on a single stem. The leaves are typically glossy, and they have jagged edges that come to a point at the front.  If you have been exposed to poison ivy, thoroughly wash with soap and water right away. You have about 30 minutes to remove the plant resin before it will cause the rash. Be sure to wash under your fingernails because any plant resin there will continue to spread the rash.  When hiking or camping, wear clothes that will help you to avoid exposure on the skin. This includes long pants,  a long-sleeved shirt, tall socks, and hiking boots. You can also apply preventive lotion to your skin to help limit exposure.  If you suspect that your clothes or outdoor gear came in contact with poison ivy, rinse them off outside with a garden hose before you bring them inside your house. Contact a health care provider if:  You have open sores in the rash area.  You have more redness, swelling,  or pain in the affected area.  You have redness that spreads beyond the rash area.  You have fluid, blood, or pus coming from the affected area.  You have a fever.  You have a rash over a large area of your body.  You have a rash on your eyes, mouth, or genitals.  Your rash does not improve after a few days. Get help right away if:  Your face swells or your eyes swell shut.  You have trouble breathing.  You have trouble swallowing. This information is not intended to replace advice given to you by your health care provider. Make sure you discuss any questions you have with your health care provider. Document Released: 08/25/2000 Document Revised: 02/08/2017 Document Reviewed: 02/03/2015 Elsevier Interactive Patient Education  2019 [employer].

## 2019-02-17 ENCOUNTER — Telehealth: Payer: Self-pay | Admitting: Family Medicine

## 2019-02-17 NOTE — Telephone Encounter (Signed)
Pt states he still has poison oak rash, pt states he used cream for 3 weeks and finally could not stand  the itch, so he took the oral medication you prescribed and is still using cream. Pt states it is no better.  Pt is wanting to know if anything else can be used.

## 2019-02-17 NOTE — Telephone Encounter (Signed)
Patient should be seen in clinic if not better with typical conservative management.  Please schedule follow-up appoint with me in person.

## 2019-02-18 NOTE — Telephone Encounter (Signed)
Appointment scheduled,

## 2019-02-19 ENCOUNTER — Ambulatory Visit (INDEPENDENT_AMBULATORY_CARE_PROVIDER_SITE_OTHER): Payer: BC Managed Care – PPO | Admitting: Family Medicine

## 2019-02-19 ENCOUNTER — Encounter: Payer: Self-pay | Admitting: Family Medicine

## 2019-02-19 VITALS — BP 131/82 | HR 61 | Temp 97.8°F | Wt 162.0 lb

## 2019-02-19 DIAGNOSIS — L739 Follicular disorder, unspecified: Secondary | ICD-10-CM

## 2019-02-19 DIAGNOSIS — Z1211 Encounter for screening for malignant neoplasm of colon: Secondary | ICD-10-CM

## 2019-02-19 MED ORDER — MUPIROCIN 2 % EX OINT: TOPICAL_OINTMENT | CUTANEOUS | 3 refills | Status: DC

## 2019-02-19 MED ORDER — TRIAMCINOLONE ACETONIDE 0.1 % EX CREA: 1.0000 "application " | TOPICAL_CREAM | Freq: Two times a day (BID) | CUTANEOUS | 3 refills | Status: DC

## 2019-02-19 NOTE — Progress Notes (Signed)
Danny Cohen is a 53 y.o. male who presents to [facility] today for rash.  Patient was seen in April via virtual visit for what was thought to be poison ivy dermatitis.  He was treated with triamcinolone cream.  On June 6 he called back saying that he still is having poison oak or poison ivy rash despite triamcinolone cream and oral prednisone course.  He was advised to return to clinic for recheck and reevaluation.  He notes in the interim he has had not particularly itchy red bumps on his chest and posterior scalp.  Occasionally he will get little itchy red bumps on his extremities.  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  He denies fevers chills nausea vomiting or diarrhea.  He feels well otherwise.  Additionally he notes that he is due for colon cancer screening.  He would like to avoid colonoscopy if possible proceed with Cologuard. ROS as above:  Exam:  BP 131/82   Pulse 61   Temp 97.8 F (36.6 C) (Oral)   Wt 162 lb (73.5 kg)   BMI 25.37 kg/m  Wt Readings from Last 5 Encounters:  02/19/19 162 lb (73.5 kg)  12/26/18 160 lb (72.6 kg)  09/26/17 160 lb (72.6 kg)  09/07/17 157 lb (71.2 kg)  02/11/17 152 lb 8 oz (69.2 kg)    Gen: Well NAD HEENT: EOMI,  MMM Lungs: Normal work of breathing. CTABL Heart: RRR no MRG Abd: NABS, Soft. Nondistended, Nontender Exts: Brisk capillary refill, warm and well perfused.  Skin: Small erythematous papules on chest and posterior scalp associate with hair follicles.  No other rash visible.     Assessment and Plan: 53 y.o. male with  Rash: Erythematous papules on chest wall and scalp appear to be folliculitis.  I do not think this is anything to do with poison ivy or poison oak or other contact dermatitis.  It is possible that some of the itchy papules that are no longer present were  chigger bites. Plan to treat folliculitis with doxycycline chlorhexidine body wash and mupirocin antibiotic ointment applied to nostril.  Precautions reviewed and patient informed of treatment plan and options.  Additionally patient due for colon cancer screening.  After discussion we will proceed with Cologuard.  PDMP not reviewed this encounter. Orders Placed This Encounter  Procedures  . Cologuard   Meds ordered this encounter  Medications  . triamcinolone cream (KENALOG) 0.1 %    Sig: Apply 1 application topically 2 (two) times daily.    Dispense:  453.6 g    Refill:  3  . mupirocin ointment (BACTROBAN) 2 %    Sig: Apply to inside nostrils twice daily for 1 week    Dispense:  30 g    Refill:  3     Historical information moved to improve visibility of documentation.  Past Medical History:  Diagnosis Date  . Chronic back pain greater than 3 months duration 09/03/2011  . Esophageal reflux disease 09/03/2011  . Gastric ulcer due to Helicobacter pylori 63/14/9702  . Peptic ulcer due to Helicobacter pylori 63/78/5885   Past Surgical History:  Procedure Laterality Date  . HERNIA REPAIR    . LAPAROSCOPIC INGUINAL HERNIA REPAIR  04/23/2012   bilateral   Social History   Tobacco Use  . Smoking status: Former Smoker    Last attempt to quit: 09/11/2000    Years since quitting: 18.4  .  Smokeless tobacco: Never Used  Substance Use Topics  . Alcohol use: No   family history includes Heart failure in his mother.  Medications: Current Outpatient Medications  Medication Sig Dispense Refill  . ibuprofen (ADVIL,MOTRIN) 200 MG tablet Take 200 mg by mouth every 6 (six) hours as needed.    . triamcinolone cream (KENALOG) 0.1 % Apply 1 application topically 2 (two) times daily. 453.6 g 3  . mupirocin ointment (BACTROBAN) 2 % Apply to inside nostrils twice daily for 1 week 30 g 3   No current facility-administered medications for this visit.    Allergies  Allergen Reactions  .  Nsaids Other (See Comments)    Pt has history of peptic ulcer      Discussed warning signs or symptoms. Please see discharge instructions. Patient expresses understanding.

## 2019-02-19 NOTE — Patient Instructions (Addendum)
Thank you for coming in today.  Take doxycycline twice daily for 1 week.  Use chlorhexidine (Hibiclens)  body wash daily for 1 week and the every few days.   Use the triamcinolone cream on the itchy rash.   I think he rash on the chest and scalp is a bacterial skin infection called folliculitis.    Folliculitis  Folliculitis is inflammation of the hair follicles. Folliculitis most commonly occurs on the scalp, thighs, legs, back, and buttocks. However, it can occur anywhere on the body. What are the causes? This condition may be caused by:  A bacterial infection (common).  A fungal infection.  A viral infection.  Coming into contact with certain chemicals, especially oils and tars.  Shaving or waxing.  Applying greasy ointments or creams to your skin often. Long-lasting folliculitis and folliculitis that keeps coming back can be caused by bacteria that live in the nostrils. What increases the risk? This condition is more likely to develop in people with:  A weakened immune system.  Diabetes.  Obesity. What are the signs or symptoms? Symptoms of this condition include:  Redness.  Soreness.  Swelling.  Itching.  Small white or yellow, pus-filled, itchy spots (pustules) that appear over a reddened area. If there is an infection that goes deep into the follicle, these may develop into a boil (furuncle).  A group of closely packed boils (carbuncle). These tend to form in hairy, sweaty areas of the body. How is this diagnosed? This condition is diagnosed with a skin exam. To find what is causing the condition, your health care provider may take a sample of one of the pustules or boils for testing. How is this treated? This condition may be treated by:  Applying warm compresses to the affected areas.  Taking an antibiotic medicine or applying an antibiotic medicine to the skin.  Applying or bathing with an antiseptic solution.  Taking an over-the-counter medicine  to help with itching.  Having a procedure to drain any pustules or boils. This may be done if a pustule or boil contains a lot of pus or fluid.  Laser hair removal. This may be done to treat long-lasting folliculitis. Follow these instructions at home:  If directed, apply heat to the affected area as often as told by your health care provider. Use the heat source that your health care provider recommends, such as a moist heat pack or a heating pad. ? Place a towel between your skin and the heat source. ? Leave the heat on for 20-30 minutes. ? Remove the heat if your skin turns bright red. This is especially important if you are unable to feel pain, heat, or cold. You may have a greater risk of getting burned.  If you were prescribed an antibiotic medicine, use it as told by your health care provider. Do not stop using the antibiotic even if you start to feel better.  Take over-the-counter and prescription medicines only as told by your health care provider.  Do not shave irritated skin.  Keep all follow-up visits as told by your health care provider. This is important. Get help right away if:  You have more redness, swelling, or pain in the affected area.  Red streaks are spreading from the affected area.  You have a fever. This information is not intended to replace advice given to you by your health care provider. Make sure you discuss any questions you have with your health care provider. Document Released: 11/06/2001 Document Revised: 03/17/2016 Document  Reviewed: 06/18/2015 Elsevier Interactive Patient Education  [employer].

## 2019-03-03 ENCOUNTER — Telehealth: Payer: Self-pay | Admitting: Family Medicine

## 2019-03-03 NOTE — Telephone Encounter (Signed)
Pt stated he needed more of the antibiotic cream called in, he feels like he still has the bacteria present.  Pt was using bactoban.

## 2019-03-04 MED ORDER — MUPIROCIN 2 % EX OINT: TOPICAL_OINTMENT | CUTANEOUS | 3 refills | Status: DC

## 2019-03-04 NOTE — Telephone Encounter (Signed)
Patient notified and voices understanding. He did not have any additional questions. He will come back in the office if the place gets worse after taking the next round of medication.

## 2019-03-04 NOTE — Telephone Encounter (Signed)
Medication sent to pharmacy.  Recheck if not improving.

## 2019-04-09 ENCOUNTER — Other Ambulatory Visit: Payer: Self-pay

## 2019-04-09 ENCOUNTER — Ambulatory Visit (INDEPENDENT_AMBULATORY_CARE_PROVIDER_SITE_OTHER): Payer: BC Managed Care – PPO | Admitting: Family Medicine

## 2019-04-09 ENCOUNTER — Ambulatory Visit (INDEPENDENT_AMBULATORY_CARE_PROVIDER_SITE_OTHER): Payer: BC Managed Care – PPO

## 2019-04-09 ENCOUNTER — Encounter: Payer: Self-pay | Admitting: Family Medicine

## 2019-04-09 VITALS — BP 129/82 | HR 69 | Temp 98.2°F | Wt 159.0 lb

## 2019-04-09 DIAGNOSIS — L739 Follicular disorder, unspecified: Secondary | ICD-10-CM | POA: Diagnosis not present

## 2019-04-09 DIAGNOSIS — M25571 Pain in right ankle and joints of right foot: Secondary | ICD-10-CM | POA: Diagnosis not present

## 2019-04-09 DIAGNOSIS — S99911A Unspecified injury of right ankle, initial encounter: Secondary | ICD-10-CM | POA: Diagnosis not present

## 2019-04-09 MED ORDER — DOXYCYCLINE HYCLATE 100 MG PO TABS: 100.0000 mg | ORAL_TABLET | Freq: Every day | ORAL | 0 refills | Status: DC

## 2019-04-09 NOTE — Progress Notes (Signed)
Danny Cohen is a 53 y.o. male who presents to [facility] today for right leg pain.  About a week ago patient hopped down from his porch about 3 feet down landing on his right leg.  He had a little bit more pain and swelling which has persisted.  He has a remote history of significant fracture of his fibula and tibia that was managed conservatively without ORIF.  The bone has healed quite well and he is done well previously without a lot of pain.  He notes he is having more soreness and swelling than his typical.  He is able to walk normally and feels pretty well but he wants to double check that he did not break his leg again or reinjure the old fracture..   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  He was seen for this issue over a month ago.  He responded quite well to the short course of doxycycline but his folliculitis returned.  He notes when he takes doxycycline it improves but it returns again.  ROS:  As above  Exam:  BP 129/82   Pulse 69   Temp 98.2 F (36.8 C) (Oral)   Wt 159 lb (72.1 kg)   BMI 24.90 kg/m  Wt Readings from Last 5 Encounters:  04/09/19 159 lb (72.1 kg)  02/19/19 162 lb (73.5 kg)  12/26/18 160 lb (72.6 kg)  09/26/17 160 lb (72.6 kg)  09/07/17 157 lb (71.2 kg)   General: Well Developed, well nourished, and in no acute distress.  Neuro/Psych: Alert and oriented x3, extra-ocular muscles intact, able to move all 4 extremities, sensation grossly intact. Skin: Warm and dry, small erythematous papular rash present on trunk consistent appearance with folliculitis. Respiratory: Not using accessory muscles, speaking in full sentences, trachea midline.  Cardiovascular: Pulses palpable, no extremity edema. Abdomen: Does not appear distended. MSK:  Right leg: Right hip normal motion Right knee normal-appearing normal motion not particularly tender. Right ankle normal-appearing slightly swollen no severe  swelling. Normal ankle motion.  Not particularly tender.  Stable ligamentous exam. Pulses cap refill sensation intact distally to right foot.    Lab and Radiology Results X-ray images right ankle obtained today personally independently reviewed. Old fracture visible at distal tib-fib however old fracture line is somewhat oblique and extends beyond the superior portion of the x-ray images obtained today.  No new or acute fractures are visible within the old fracture or into the ankle. Await formal radiology over read    Assessment and Plan: 53 y.o. male with right leg pain after small jump in the site of previous old fracture.  Fracture appears to be well healed and anatomically reasonably aligned however it is possible that were missing something a little bit higher up than I can see on his ankle x-ray.  Clinically he is doing quite well and after discussion plan for a bit of watchful waiting.  If not improving or if concern will obtain tib-fib in near future.  Otherwise recommend ankle compression and advancing activity as tolerated.  Folliculitis: Recurrent issue.  Plan for long-term doxycycline.  Will use 50 mg twice daily.  90-day supply prescribed.  Will reassess if needed. PDMP not reviewed this encounter. No orders of the defined types were placed in this encounter.  No orders of the defined types were placed in this encounter.   Historical information moved to improve visibility of documentation.  Past Medical History:  Diagnosis Date  . Chronic back  pain greater than 3 months duration 09/03/2011  . Esophageal reflux disease 09/03/2011  . Gastric ulcer due to Helicobacter pylori 53/66/4403  . Peptic ulcer due to Helicobacter pylori 47/42/5956   Past Surgical History:  Procedure Laterality Date  . HERNIA REPAIR    . LAPAROSCOPIC INGUINAL HERNIA REPAIR  04/23/2012   bilateral   Social History   Tobacco Use  . Smoking status: Former Smoker    Quit date: 09/11/2000    Years  since quitting: 18.5  . Smokeless tobacco: Never Used  Substance Use Topics  . Alcohol use: No   family history includes Heart failure in his mother.  Medications: Current Outpatient Medications  Medication Sig Dispense Refill  . ibuprofen (ADVIL,MOTRIN) 200 MG tablet Take 200 mg by mouth every 6 (six) hours as needed.    . mupirocin ointment (BACTROBAN) 2 % Apply to inside nostrils twice daily for 1 week 30 g 3  . triamcinolone cream (KENALOG) 0.1 % Apply 1 application topically 2 (two) times daily. 453.6 g 3   No current facility-administered medications for this visit.    Allergies  Allergen Reactions  . Nsaids Other (See Comments)    Pt has history of peptic ulcer       Discussed warning signs or symptoms. Please see discharge instructions. Patient expresses understanding.

## 2019-04-09 NOTE — Patient Instructions (Addendum)
Thank you for coming in today. I think it will be ok.  Xray of your ankle looks ok per my read however radiology will look at it later.  They may recommend a longer view of the ankle and leg but if healing anf doing better no need for more xray.  If not doing well we may want to get a longer view (Tib/Fb). Try compression sleeve. Body helix full calf sleeve.  Advance activity as tolerated.  Recheck as needed.    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  Let me know if it does not help.     Folliculitis  Folliculitis is inflammation of the hair follicles. Folliculitis most commonly occurs on the scalp, thighs, legs, back, and buttocks. However, it can occur anywhere on the body. What are the causes? This condition may be caused by:  A bacterial infection (common).  A fungal infection.  A viral infection.  Contact with certain chemicals, especially oils and tars.  Shaving or waxing.  Greasy ointments or creams applied to the skin. Long-lasting folliculitis and folliculitis that keeps coming back may be caused by bacteria. This bacteria can live anywhere on your skin and is often found in the nostrils. What increases the risk? You are more likely to develop this condition if you have:  A weakened immune system.  Diabetes.  Obesity. What are the signs or symptoms? Symptoms of this condition include:  Redness.  Soreness.  Swelling.  Itching.  Small white or yellow, pus-filled, itchy spots (pustules) that appear over a reddened area. If there is an infection that goes deep into the follicle, these may develop into a boil (furuncle).  A group of closely packed boils (carbuncle). These tend to form in hairy, sweaty areas of the body. How is this diagnosed? This condition is diagnosed with a skin exam. To find what is causing the condition, your health care provider may take a sample of one of the pustules or boils for testing in a lab. How is  this treated? This condition may be treated by:  Applying warm compresses to the affected areas.  Taking an antibiotic medicine or applying an antibiotic medicine to the skin.  Applying or bathing with an antiseptic solution.  Taking an over-the-counter medicine to help with itching.  Having a procedure to drain any pustules or boils. This may be done if a pustule or boil contains a lot of pus or fluid.  Having laser hair removal. This may be done to treat long-lasting folliculitis. Follow these instructions at home: Managing pain and swelling   If directed, apply heat to the affected area as often as told by your health care provider. Use the heat source that your health care provider recommends, such as a moist heat pack or a heating pad. ? Place a towel between your skin and the heat source. ? Leave the heat on for 20-30 minutes. ? Remove the heat if your skin turns bright red. This is especially important if you are unable to feel pain, heat, or cold. You may have a greater risk of getting burned. General instructions  If you were prescribed an antibiotic medicine, take it or apply it as told by your health care provider. Do not stop using the antibiotic even if your condition improves.  Check the irritated area every day for signs of infection. Check for: ? Redness, swelling, or pain. ? Fluid or blood. ? Warmth. ? Pus or a bad smell.  Do not shave  irritated skin.  Take over-the-counter and prescription medicines only as told by your health care provider.  Keep all follow-up visits as told by your health care provider. This is important. Get help right away if:  You have more redness, swelling, or pain in the affected area.  Red streaks are spreading from the affected area.  You have a fever. Summary  Folliculitis is inflammation of the hair follicles. Folliculitis most commonly occurs on the scalp, thighs, legs, back, and buttocks.  This condition may be treated  by taking an antibiotic medicine or applying an antibiotic medicine to the skin, and applying or bathing with an antiseptic solution.  If you were prescribed an antibiotic medicine, take it or apply it as told by your health care provider. Do not stop using the antibiotic even if your condition improves.  Get help right away if you have new or worsening symptoms.  Keep all follow-up visits as told by your health care provider. This is important. This information is not intended to replace advice given to you by your health care provider. Make sure you discuss any questions you have with your health care provider. Document Released: 11/06/2001 Document Revised: 04/06/2018 Document Reviewed: 04/06/2018 Elsevier Patient Education  2020 [employer].

## 2019-06-30 ENCOUNTER — Other Ambulatory Visit: Payer: Self-pay | Admitting: Family Medicine

## 2019-09-22 ENCOUNTER — Other Ambulatory Visit: Payer: Self-pay | Admitting: Family Medicine

## 2019-09-23 ENCOUNTER — Other Ambulatory Visit: Payer: Self-pay

## 2019-09-23 MED ORDER — DOXYCYCLINE HYCLATE 100 MG PO TABS: 100.0000 mg | ORAL_TABLET | Freq: Every day | ORAL | 0 refills | Status: DC

## 2019-09-23 NOTE — Telephone Encounter (Signed)
Patient advised to schedule an appointment with Dr Rodman Key. He was transferred to the front to schedule. 30 day supply of doxycycline has been sent.

## 2019-10-13 ENCOUNTER — Encounter: Payer: Self-pay | Admitting: Family Medicine

## 2019-10-13 ENCOUNTER — Other Ambulatory Visit: Payer: Self-pay

## 2019-10-13 ENCOUNTER — Ambulatory Visit (INDEPENDENT_AMBULATORY_CARE_PROVIDER_SITE_OTHER): Payer: 59 | Admitting: Family Medicine

## 2019-10-13 DIAGNOSIS — L739 Follicular disorder, unspecified: Secondary | ICD-10-CM

## 2019-10-13 MED ORDER — DOXYCYCLINE HYCLATE 100 MG PO TABS: 100.0000 mg | ORAL_TABLET | Freq: Every day | ORAL | 3 refills | Status: DC

## 2019-10-13 NOTE — Patient Instructions (Signed)
Nice to meet you today! Continue doxycycline daily as needed for flares.  Call for any worsening symptoms.

## 2019-10-13 NOTE — Assessment & Plan Note (Signed)
Recurrent folliculitis improved with daily doxycycline.   Will continue at current dose for now.  He will return for CPE in a few months and we'll reassess how he is doing at that time.

## 2019-10-13 NOTE — Progress Notes (Signed)
Danny Cohen. Danny Cohen - 54 y.o. male MRN [id number]  Date of birth: 04-08-1966  Subjective Chief Complaint  Patient presents with  . Follow-up    rash    HPI Danny Cohen. [place] is a 54 y.o. male here today for follow up of rash.  He was last seen about 6 months ago and diagnosed with recurrent folliculitis.  Since that time he was started on doxycycline daily and has had improvement of the rash.  He had tried chlorhexidine scrubs and triamcinolone prior without improvement.  He denies side effects from doxycycline   ROS:  A comprehensive ROS was completed and negative except as noted per HPI  Allergies  Allergen Reactions  . Nsaids Other (See Comments)    Pt has history of peptic ulcer     Past Medical History:  Diagnosis Date  . Chronic back pain greater than 3 months duration 09/03/2011  . Esophageal reflux disease 09/03/2011  . Gastric ulcer due to Helicobacter pylori [id number]  . Peptic ulcer due to Helicobacter pylori [id number]    Past Surgical History:  Procedure Laterality Date  . HERNIA REPAIR    . LAPAROSCOPIC INGUINAL HERNIA REPAIR  04/23/2012   bilateral    Social History   Socioeconomic History  . Marital status: Married    Spouse name: Not on file  . Number of children: Not on file  . Years of education: Not on file  . Highest education level: Not on file  Occupational History  . Not on file  Tobacco Use  . Smoking status: Former Smoker    Quit date: 09/11/2000    Years since quitting: 19.0  . Smokeless tobacco: Never Used  Substance and Sexual Activity  . Alcohol use: No  . Drug use: No  . Sexual activity: Not on file  Other Topics Concern  . Not on file  Social History Narrative  . Not on file   Social Determinants of Health   Financial Resource Strain:   . Difficulty of Paying Living Expenses: Not on file  Food Insecurity:   . Worried About Charity fundraiser in the Last Year: Not on file  . Ran Out of Food in the Last Year: Not on file   Transportation Needs:   . Lack of Transportation (Medical): Not on file  . Lack of Transportation (Non-Medical): Not on file  Physical Activity:   . Days of Exercise per Week: Not on file  . Minutes of Exercise per Session: Not on file  Stress:   . Feeling of Stress : Not on file  Social Connections:   . Frequency of Communication with Friends and Family: Not on file  . Frequency of Social Gatherings with Friends and Family: Not on file  . Attends Religious Services: Not on file  . Active Member of Clubs or Organizations: Not on file  . Attends Archivist Meetings: Not on file  . Marital Status: Not on file    Family History  Problem Relation Age of Onset  . Heart failure Mother     Health Maintenance  Topic Date Due  . HIV Screening  01/05/1983  . Fecal DNA (Cologuard)  01/04/2018  . INFLUENZA VACCINE  04/12/2019  . TETANUS/TDAP  08/30/2021    ----------------------------------------------------------------------------------------------------------------------------------------------------------------------------------------------------------------- Physical Exam BP 113/77   Pulse 78   Ht 5\' 7"  (1.702 m)   Wt 164 lb (74.4 kg)   BMI 25.69 kg/m   Physical Exam Constitutional:      General: He is not  in acute distress.    Appearance: Normal appearance.  Musculoskeletal:     Cervical back: Neck supple.  Lymphadenopathy:     Cervical: No cervical adenopathy.  Skin:    General: Skin is warm and dry.     Comments: Scattered erythematous papules on trunk and shoulders.   Neurological:     General: No focal deficit present.     Mental Status: He is alert.  Psychiatric:        Mood and Affect: Mood normal.        Behavior: Behavior normal.      ------------------------------------------------------------------------------------------------------------------------------------------------------------------------------------------------------------------- Assessment and Plan  Folliculitis Recurrent folliculitis improved with daily doxycycline.   Will continue at current dose for now.  He will return for CPE in a few months and we'll reassess how he is doing at that time.     This visit occurred during the SARS-CoV-2 public health emergency.  Safety protocols were in place, including screening questions prior to the visit, additional usage of staff PPE, and extensive cleaning of exam room while observing appropriate contact time as indicated for disinfecting solutions.

## 2019-10-27 ENCOUNTER — Other Ambulatory Visit: Payer: Self-pay

## 2019-10-27 ENCOUNTER — Encounter: Payer: Self-pay | Admitting: Family Medicine

## 2019-10-27 ENCOUNTER — Ambulatory Visit (INDEPENDENT_AMBULATORY_CARE_PROVIDER_SITE_OTHER): Payer: 59 | Admitting: Family Medicine

## 2019-10-27 VITALS — BP 156/91 | HR 79 | Temp 98.2°F | Ht 66.0 in | Wt 169.0 lb

## 2019-10-27 DIAGNOSIS — K625 Hemorrhage of anus and rectum: Secondary | ICD-10-CM | POA: Diagnosis not present

## 2019-10-27 LAB — HEMOCCULT GUIAC POC 1CARD (OFFICE): Fecal Occult Blood, POC: NEGATIVE

## 2019-10-27 NOTE — Patient Instructions (Signed)
Rectal Bleeding  Rectal bleeding is when blood comes out of the opening of the butt (anus). People with this kind of bleeding may notice bright red blood in their underwear or in the toilet after they poop (have a bowel movement). They may also have dark red or black poop (stool). Rectal bleeding is often a sign that something is wrong. It needs to be checked by a doctor. Follow these instructions at home: Watch for any changes in your condition. Take these actions to help with bleeding and discomfort:  Eat a diet that is high in fiber. This will keep your poop soft so it is easier for you to poop without pushing too hard. Ask your doctor to tell you what foods and drinks are high in fiber.  Drink enough fluid to keep your pee (urine) clear or pale yellow. This also helps keep your poop soft.  Try taking a warm bath. This may help with pain.  Keep all follow-up visits as told by your doctor. This is important. Get help right away if:  You have new bleeding.  You have more bleeding than before.  You have black or dark red poop.  You throw up (vomit) blood or something that looks like coffee grounds.  You have pain or tenderness in your belly (abdomen).  You have a fever.  You feel weak.  You feel sick to your stomach (nauseous).  You pass out (faint).  You have very bad pain in your butt.  You cannot poop. This information is not intended to replace advice given to you by your health care provider. Make sure you discuss any questions you have with your health care provider. Document Revised: 08/10/2017 Document Reviewed: 10/24/2015 Elsevier Patient Education  2020 Elsevier Inc[employer]  

## 2019-10-27 NOTE — Assessment & Plan Note (Signed)
Intermittent rectal bleeding.  Denies symptoms of anemia.  Recommend increasing fiber intake, continue probiotic.  Overdue for colon cancer screening, referral entered to [facility].

## 2019-10-27 NOTE — Progress Notes (Signed)
Danny Cohen. Suit - 54 y.o. male MRN [id number]  Date of birth: Feb 22, 1966  Subjective Chief Complaint  Patient presents with  . Hematochezia    HPI  Danny Cohen is a 54 y.o. male with history of GERD here today with complaint of rectal bleeding.  He first noticed this a few days ago.  He had what he thought was a normal bowel movement however with wiping he noticed blood and there was blood in the toilet.  Next BM was normal however subsequent BM were bloody until last night. He does admit to some constipation.  He denies associated abdominal pain, nausea, vomiting, fever, chills.  He does take quite a bit of motrin for his chronic neck pain but denies dark or tarry stools.   ROS:  A comprehensive ROS was completed and negative except as noted per HPI  Allergies  Allergen Reactions  . Nsaids Other (See Comments)    Pt has history of peptic ulcer     Past Medical History:  Diagnosis Date  . Chronic back pain greater than 3 months duration 09/03/2011  . Esophageal reflux disease 09/03/2011  . Gastric ulcer due to Helicobacter pylori [id number]  . Peptic ulcer due to Helicobacter pylori [id number]    Past Surgical History:  Procedure Laterality Date  . HERNIA REPAIR    . LAPAROSCOPIC INGUINAL HERNIA REPAIR  04/23/2012   bilateral    Social History   Socioeconomic History  . Marital status: Married    Spouse name: Not on file  . Number of children: Not on file  . Years of education: Not on file  . Highest education level: Not on file  Occupational History  . Not on file  Tobacco Use  . Smoking status: Former Smoker    Quit date: 09/11/2000    Years since quitting: 19.1  . Smokeless tobacco: Never Used  Substance and Sexual Activity  . Alcohol use: No  . Drug use: No  . Sexual activity: Not on file  Other Topics Concern  . Not on file  Social History Narrative  . Not on file   Social Determinants of Health   Financial Resource Strain:   . Difficulty of Paying  Living Expenses: Not on file  Food Insecurity:   . Worried About Charity fundraiser in the Last Year: Not on file  . Ran Out of Food in the Last Year: Not on file  Transportation Needs:   . Lack of Transportation (Medical): Not on file  . Lack of Transportation (Non-Medical): Not on file  Physical Activity:   . Days of Exercise per Week: Not on file  . Minutes of Exercise per Session: Not on file  Stress:   . Feeling of Stress : Not on file  Social Connections:   . Frequency of Communication with Friends and Family: Not on file  . Frequency of Social Gatherings with Friends and Family: Not on file  . Attends Religious Services: Not on file  . Active Member of Clubs or Organizations: Not on file  . Attends Archivist Meetings: Not on file  . Marital Status: Not on file    Family History  Problem Relation Age of Onset  . Heart failure Mother     Health Maintenance  Topic Date Due  . HIV Screening  01/05/1983  . Fecal DNA (Cologuard)  01/04/2018  . INFLUENZA VACCINE  04/12/2019  . TETANUS/TDAP  08/30/2021    ----------------------------------------------------------------------------------------------------------------------------------------------------------------------------------------------------------------- Physical Exam BP (!) 156/91  Pulse 79   Temp 98.2 F (36.8 C) (Oral)   Ht 5\' 6"  (1.676 m)   Wt 169 lb (76.7 kg)   BMI 27.28 kg/m   Physical Exam Constitutional:      Appearance: Normal appearance.  HENT:     Head: Normocephalic and atraumatic.  Cardiovascular:     Rate and Rhythm: Normal rate and regular rhythm.  Pulmonary:     Effort: Pulmonary effort is normal.     Breath sounds: Normal breath sounds.  Abdominal:     General: Abdomen is flat. There is no distension.     Tenderness: There is no abdominal tenderness.  Genitourinary:    Comments: No thrombosed hemorrhoids or fissures noted.  DRE without internal masses.  Prostate normal.    Skin:    General: Skin is warm and dry.  Neurological:     General: No focal deficit present.     Mental Status: He is alert.     ------------------------------------------------------------------------------------------------------------------------------------------------------------------------------------------------------------------- Assessment and Plan  Rectal bleeding Intermittent rectal bleeding.  Denies symptoms of anemia.  Recommend increasing fiber intake, continue probiotic.  Overdue for colon cancer screening, referral entered to [facility].      This visit occurred during the SARS-CoV-2 public health emergency.  Safety protocols were in place, including screening questions prior to the visit, additional usage of staff PPE, and extensive cleaning of exam room while observing appropriate contact time as indicated for disinfecting solutions.

## 2019-11-19 ENCOUNTER — Telehealth: Payer: Self-pay | Admitting: Family Medicine

## 2019-11-19 NOTE — Telephone Encounter (Addendum)
Patient called to check and see if he would need an appointment for the nerve pain medication that you were going to send into his pharmacy. I do not see that we have seen him for this problem. Please advise.

## 2019-11-20 ENCOUNTER — Other Ambulatory Visit: Payer: Self-pay | Admitting: Family Medicine

## 2019-11-20 MED ORDER — GABAPENTIN 300 MG PO CAPS: 300.0000 mg | ORAL_CAPSULE | Freq: Three times a day (TID) | ORAL | 1 refills | Status: DC

## 2019-11-20 NOTE — Telephone Encounter (Addendum)
Gabapentin sent in.  F/u in 4-6 weeks to assess how he is doing with this. Thanks!

## 2019-11-21 NOTE — Telephone Encounter (Addendum)
I called the patient and he is aware and voices understanding. No other concerns.

## 2019-11-21 NOTE — Telephone Encounter (Signed)
Error

## 2019-12-03 LAB — HM COLONOSCOPY

## 2019-12-22 ENCOUNTER — Other Ambulatory Visit: Payer: Self-pay

## 2019-12-22 ENCOUNTER — Encounter: Payer: Self-pay | Admitting: Family Medicine

## 2019-12-22 ENCOUNTER — Ambulatory Visit (INDEPENDENT_AMBULATORY_CARE_PROVIDER_SITE_OTHER): Payer: 59 | Admitting: Family Medicine

## 2019-12-22 DIAGNOSIS — M5412 Radiculopathy, cervical region: Secondary | ICD-10-CM | POA: Diagnosis not present

## 2019-12-22 MED ORDER — GABAPENTIN 300 MG PO CAPS: 300.0000 mg | ORAL_CAPSULE | Freq: Three times a day (TID) | ORAL | 3 refills | Status: DC

## 2019-12-22 NOTE — Assessment & Plan Note (Signed)
He is doing well with gabapentin and has been able to reduce ibuprofen use significantly.  Discussed caution with NSAIDS due to history of PUD.  Will continue gabapentin at current strength with planned f/uin 6 months.  He will let me know if he needs to return sooner to discuss dose adjustment.

## 2019-12-22 NOTE — Patient Instructions (Signed)
Great to see you! Let's continue the gabapentin for now See me again in about 6 months or sooner if needed.

## 2019-12-22 NOTE — Progress Notes (Signed)
Danny Cohen - 54 y.o. male MRN [id number]  Date of birth: [birth year]  Subjective No chief complaint on file.   HPI Danny Cohen is a 54 y.o. male with history of cervical OA and radiculopathy.  MRI in 2015 with facet arthropathy at C5-6 on the L.  He had been using ibuprofen previously to manage this.  Gabapentin was added about 1 month ago and he has had good response to this.  He is using ibuprofen only occasionally now.  He does have some mild tingling in 4th and 5th digit of hand but denies weakness or change in grip strength.    He denies significant side effects from the gabapentin.    ROS:  A comprehensive ROS was completed and negative except as noted per HPI  Allergies  Allergen Reactions  . Nsaids Other (See Comments)    Pt has history of peptic ulcer     Past Medical History:  Diagnosis Date  . Chronic back pain greater than 3 months duration 09/03/2011  . Esophageal reflux disease 09/03/2011  . Gastric ulcer due to Helicobacter pylori [id number]  . Peptic ulcer due to Helicobacter pylori [id number]    Past Surgical History:  Procedure Laterality Date  . HERNIA REPAIR    . LAPAROSCOPIC INGUINAL HERNIA REPAIR  04/23/2012   bilateral    Social History   Socioeconomic History  . Marital status: Married    Spouse name: Not on file  . Number of children: Not on file  . Years of education: Not on file  . Highest education level: Not on file  Occupational History  . Not on file  Tobacco Use  . Smoking status: Former Smoker    Quit date: 09/11/2000    Years since quitting: 19.2  . Smokeless tobacco: Never Used  Substance and Sexual Activity  . Alcohol use: No  . Drug use: No  . Sexual activity: Not on file  Other Topics Concern  . Not on file  Social History Narrative  . Not on file   Social Determinants of Health   Financial Resource Strain:   . Difficulty of Paying Living Expenses:   Food Insecurity:   . Worried About Charity fundraiser in the Last  Year:   . Arboriculturist in the Last Year:   Transportation Needs:   . Film/video editor (Medical):   Marland Kitchen Lack of Transportation (Non-Medical):   Physical Activity:   . Days of Exercise per Week:   . Minutes of Exercise per Session:   Stress:   . Feeling of Stress :   Social Connections:   . Frequency of Communication with Friends and Family:   . Frequency of Social Gatherings with Friends and Family:   . Attends Religious Services:   . Active Member of Clubs or Organizations:   . Attends Archivist Meetings:   Marland Kitchen Marital Status:     Family History  Problem Relation Age of Onset  . Heart failure Mother     Health Maintenance  Topic Date Due  . HIV Screening  Never done  . Fecal DNA (Cologuard)  Never done  . INFLUENZA VACCINE  04/11/2020  . TETANUS/TDAP  08/30/2021     ----------------------------------------------------------------------------------------------------------------------------------------------------------------------------------------------------------------- Physical Exam BP 122/86   Pulse 63   Temp 97.9 F (36.6 C) (Oral)   Ht 5\' 6"  (1.676 m)   Wt 163 lb (73.9 kg)   BMI 26.31 kg/m   Physical Exam Constitutional:  Appearance: Normal appearance.  Musculoskeletal:     Cervical back: Normal range of motion.  Skin:    General: Skin is warm and dry.  Neurological:     General: No focal deficit present.     Mental Status: He is alert.     Motor: No weakness.  Psychiatric:        Mood and Affect: Mood normal.        Behavior: Behavior normal.     ------------------------------------------------------------------------------------------------------------------------------------------------------------------------------------------------------------------- Assessment and Plan  Cervical radiculopathy He is doing well with gabapentin and has been able to reduce ibuprofen use significantly.  Discussed caution with NSAIDS due to  history of PUD.  Will continue gabapentin at current strength with planned f/uin 6 months.  He will let me know if he needs to return sooner to discuss dose adjustment.     Meds ordered this encounter  Medications  . gabapentin (NEURONTIN) 300 MG capsule    Sig: Take 1 capsule (300 mg total) by mouth 3 (three) times daily.    Dispense:  90 capsule    Refill:  3    Return in about 6 months (around 06/22/2020) for cervical radiculopathy.    This visit occurred during the SARS-CoV-2 public health emergency.  Safety protocols were in place, including screening questions prior to the visit, additional usage of staff PPE, and extensive cleaning of exam room while observing appropriate contact time as indicated for disinfecting solutions.

## 2020-01-23 ENCOUNTER — Other Ambulatory Visit: Payer: Self-pay

## 2020-01-23 ENCOUNTER — Ambulatory Visit (INDEPENDENT_AMBULATORY_CARE_PROVIDER_SITE_OTHER): Payer: 59 | Admitting: Family Medicine

## 2020-01-23 ENCOUNTER — Encounter: Payer: Self-pay | Admitting: Family Medicine

## 2020-01-23 VITALS — BP 127/77 | HR 66 | Temp 98.2°F | Ht 66.14 in | Wt 164.2 lb

## 2020-01-23 DIAGNOSIS — Z125 Encounter for screening for malignant neoplasm of prostate: Secondary | ICD-10-CM

## 2020-01-23 DIAGNOSIS — Z0000 Encounter for general adult medical examination without abnormal findings: Secondary | ICD-10-CM

## 2020-01-23 DIAGNOSIS — Z13220 Encounter for screening for lipoid disorders: Secondary | ICD-10-CM | POA: Diagnosis not present

## 2020-01-23 NOTE — Progress Notes (Signed)
Patient had COVID vaccine but doesn't know which kind or remember the dates. States his wife has the cards.

## 2020-01-23 NOTE — Patient Instructions (Signed)
Preventive Care 41-52 Years Old, Male Preventive care refers to lifestyle choices and visits with your health care provider that can promote health and wellness. This includes:  A yearly physical exam. This is also called an annual well check.  Regular dental and eye exams.  Immunizations.  Screening for certain conditions.  Healthy lifestyle choices, such as eating a healthy diet, getting regular exercise, not using drugs or products that contain nicotine and tobacco, and limiting alcohol use. What can I expect for my preventive care visit? Physical exam Your health care provider will check:  Height and weight. These may be used to calculate body mass index (BMI), which is a measurement that tells if you are at a healthy weight.  Heart rate and blood pressure.  Your skin for abnormal spots. Counseling Your health care provider may ask you questions about:  Alcohol, tobacco, and drug use.  Emotional well-being.  Home and relationship well-being.  Sexual activity.  Eating habits.  Work and work Statistician. What immunizations do I need?  Influenza (flu) vaccine  This is recommended every year. Tetanus, diphtheria, and pertussis (Tdap) vaccine  You may need a Td booster every 10 years. Varicella (chickenpox) vaccine  You may need this vaccine if you have not already been vaccinated. Zoster (shingles) vaccine  You may need this after age 64. Measles, mumps, and rubella (MMR) vaccine  You may need at least one dose of MMR if you were born in 1957 or later. You may also need a second dose. Pneumococcal conjugate (PCV13) vaccine  You may need this if you have certain conditions and were not previously vaccinated. Pneumococcal polysaccharide (PPSV23) vaccine  You may need one or two doses if you smoke cigarettes or if you have certain conditions. Meningococcal conjugate (MenACWY) vaccine  You may need this if you have certain conditions. Hepatitis A  vaccine  You may need this if you have certain conditions or if you travel or work in places where you may be exposed to hepatitis A. Hepatitis B vaccine  You may need this if you have certain conditions or if you travel or work in places where you may be exposed to hepatitis B. Haemophilus influenzae type b (Hib) vaccine  You may need this if you have certain risk factors. Human papillomavirus (HPV) vaccine  If recommended by your health care provider, you may need three doses over 6 months. You may receive vaccines as individual doses or as more than one vaccine together in one shot (combination vaccines). Talk with your health care provider about the risks and benefits of combination vaccines. What tests do I need? Blood tests  Lipid and cholesterol levels. These may be checked every 5 years, or more frequently if you are over 60 years old.  Hepatitis C test.  Hepatitis B test. Screening  Lung cancer screening. You may have this screening every year starting at age 43 if you have a 30-pack-year history of smoking and currently smoke or have quit within the past 15 years.  Prostate cancer screening. Recommendations will vary depending on your family history and other risks.  Colorectal cancer screening. All adults should have this screening starting at age 72 and continuing until age 2. Your health care provider may recommend screening at age 14 if you are at increased risk. You will have tests every 1-10 years, depending on your results and the type of screening test.  Diabetes screening. This is done by checking your blood sugar (glucose) after you have not eaten  for a while (fasting). You may have this done every 1-3 years.  Sexually transmitted disease (STD) testing. Follow these instructions at home: Eating and drinking  Eat a diet that includes fresh fruits and vegetables, whole grains, lean protein, and low-fat dairy products.  Take vitamin and mineral supplements as  recommended by your health care provider.  Do not drink alcohol if your health care provider tells you not to drink.  If you drink alcohol: ? Limit how much you have to 0-2 drinks a day. ? Be aware of how much alcohol is in your drink. In the [place] one drink equals one 12 oz bottle of beer (355 mL), one 5 oz glass of wine (148 mL), or one 1 oz glass of hard liquor (44 mL). Lifestyle  Take daily care of your teeth and gums.  Stay active. Exercise for at least 30 minutes on 5 or more days each week.  Do not use any products that contain nicotine or tobacco, such as cigarettes, e-cigarettes, and chewing tobacco. If you need help quitting, ask your health care provider.  If you are sexually active, practice safe sex. Use a condom or other form of protection to prevent STIs (sexually transmitted infections).  Talk with your health care provider about taking a low-dose aspirin every day starting at age 53. What's next?  Go to your health care provider once a year for a well check visit.  Ask your health care provider how often you should have your eyes and teeth checked.  Stay up to date on all vaccines. This information is not intended to replace advice given to you by your health care provider. Make sure you discuss any questions you have with your health care provider. Document Revised: 08/22/2018 Document Reviewed: 08/22/2018 Elsevier Patient Education  2020 [employer].

## 2020-01-23 NOTE — Assessment & Plan Note (Signed)
Well adult. Orders Placed This Encounter  Procedures  . COMPLETE METABOLIC PANEL WITH GFR  . CBC  . Lipid Profile  . TSH  . PSA  .Screening: PSA, Lipid.  Immunizations: UTD Anticipatory guidance/Risk factor reduction:  Recommendations per AVS.

## 2020-01-23 NOTE — Progress Notes (Signed)
Danny Cohen - 54 y.o. male MRN [id number]  Date of birth: 1966/08/21  Subjective Chief Complaint  Patient presents with  . Annual Exam    HPI Danny Cohen is a 54 y.o. male here today for annual exam.  He has no new concerns today.  Radicular neck pain is stable with gabapentin.    Had colonoscopy in 11/2019- Normal, repeat in 10 years.   He is a non-smoker.  He denies EtOH use.   Denies LUT's at this time.   He follows a healthy diet and stays pretty active through his job.   Review of Systems  Constitutional: Negative for chills, fever, malaise/fatigue and weight loss.  HENT: Negative for congestion, ear pain and sore throat.   Eyes: Negative for blurred vision, double vision and pain.  Respiratory: Negative for cough and shortness of breath.   Cardiovascular: Negative for chest pain and palpitations.  Gastrointestinal: Negative for abdominal pain, blood in stool, constipation, heartburn and nausea.  Genitourinary: Negative for dysuria and urgency.  Musculoskeletal: Negative for joint pain and myalgias.  Neurological: Negative for dizziness and headaches.  Endo/Heme/Allergies: Does not bruise/bleed easily.  Psychiatric/Behavioral: Negative for depression. The patient is not nervous/anxious and does not have insomnia.     Allergies  Allergen Reactions  . Nsaids Other (See Comments)    Pt has history of peptic ulcer     Past Medical History:  Diagnosis Date  . Chronic back pain greater than 3 months duration 09/03/2011  . Esophageal reflux disease 09/03/2011  . Gastric ulcer due to Helicobacter pylori [id number]  . Peptic ulcer due to Helicobacter pylori [id number]    Past Surgical History:  Procedure Laterality Date  . HERNIA REPAIR    . LAPAROSCOPIC INGUINAL HERNIA REPAIR  04/23/2012   bilateral    Social History   Socioeconomic History  . Marital status: Married    Spouse name: Not on file  . Number of children: Not on file  . Years of education: Not  on file  . Highest education level: Not on file  Occupational History  . Not on file  Tobacco Use  . Smoking status: Former Smoker    Quit date: 09/11/2000    Years since quitting: 19.3  . Smokeless tobacco: Never Used  Substance and Sexual Activity  . Alcohol use: No  . Drug use: No  . Sexual activity: Not on file  Other Topics Concern  . Not on file  Social History Narrative  . Not on file   Social Determinants of Health   Financial Resource Strain:   . Difficulty of Paying Living Expenses:   Food Insecurity:   . Worried About Charity fundraiser in the Last Year:   . Arboriculturist in the Last Year:   Transportation Needs:   . Film/video editor (Medical):   Marland Kitchen Lack of Transportation (Non-Medical):   Physical Activity:   . Days of Exercise per Week:   . Minutes of Exercise per Session:   Stress:   . Feeling of Stress :   Social Connections:   . Frequency of Communication with Friends and Family:   . Frequency of Social Gatherings with Friends and Family:   . Attends Religious Services:   . Active Member of Clubs or Organizations:   . Attends Archivist Meetings:   Marland Kitchen Marital Status:     Family History  Problem Relation Age of Onset  . Heart failure Mother     Health  Maintenance  Topic Date Due  . HIV Screening  Never done  . COVID-19 Vaccine (1) 04/11/2020 (Originally 01/05/1984)  . INFLUENZA VACCINE  04/11/2020  . TETANUS/TDAP  08/30/2021  . Fecal DNA (Cologuard)  12/03/2022     ----------------------------------------------------------------------------------------------------------------------------------------------------------------------------------------------------------------- Physical Exam BP 127/77 (BP Location: Left Arm, Patient Position: Sitting, Cuff Size: Normal)   Pulse 66   Temp 98.2 F (36.8 C) (Oral)   Ht 5' 6.14" (1.68 m)   Wt 164 lb 3.2 oz (74.5 kg)   SpO2 99%   BMI 26.39 kg/m   Physical Exam Constitutional:       General: He is not in acute distress.    Appearance: Normal appearance.  HENT:     Head: Normocephalic and atraumatic.     Right Ear: Tympanic membrane and external ear normal.     Left Ear: Tympanic membrane and external ear normal.  Eyes:     General: No scleral icterus. Neck:     Thyroid: No thyromegaly.  Cardiovascular:     Rate and Rhythm: Normal rate and regular rhythm.     Heart sounds: Normal heart sounds.  Pulmonary:     Effort: Pulmonary effort is normal.     Breath sounds: Normal breath sounds.  Abdominal:     General: Bowel sounds are normal. There is no distension.     Palpations: Abdomen is soft.     Tenderness: There is no abdominal tenderness. There is no guarding.  Musculoskeletal:     Cervical back: Normal range of motion.  Lymphadenopathy:     Cervical: No cervical adenopathy.  Skin:    General: Skin is warm and dry.     Findings: No rash.  Neurological:     General: No focal deficit present.     Mental Status: He is alert and oriented to person, place, and time.     Cranial Nerves: No cranial nerve deficit.     Motor: No abnormal muscle tone.  Psychiatric:        Mood and Affect: Mood normal.        Behavior: Behavior normal.     ------------------------------------------------------------------------------------------------------------------------------------------------------------------------------------------------------------------- Assessment and Plan  Well adult exam Well adult. Orders Placed This Encounter  Procedures  . COMPLETE METABOLIC PANEL WITH GFR  . CBC  . Lipid Profile  . TSH  . PSA  .Screening: PSA, Lipid.  Immunizations: UTD Anticipatory guidance/Risk factor reduction:  Recommendations per AVS.    No orders of the defined types were placed in this encounter.   No follow-ups on file.    This visit occurred during the SARS-CoV-2 public health emergency.  Safety protocols were in place, including screening questions  prior to the visit, additional usage of staff PPE, and extensive cleaning of exam room while observing appropriate contact time as indicated for disinfecting solutions.

## 2020-01-26 LAB — COMPLETE METABOLIC PANEL WITH GFR
AG Ratio: 2.6 (calc) — ABNORMAL HIGH (ref 1.0–2.5)
ALT: 21 U/L (ref 9–46)
AST: 19 U/L (ref 10–35)
Albumin: 4.6 g/dL (ref 3.6–5.1)
Alkaline phosphatase (APISO): 35 U/L (ref 35–144)
BUN: 12 mg/dL (ref 7–25)
CO2: 33 mmol/L — ABNORMAL HIGH (ref 20–32)
Calcium: 9.7 mg/dL (ref 8.6–10.3)
Chloride: 103 mmol/L (ref 98–110)
Creat: 1.22 mg/dL (ref 0.70–1.33)
GFR, Est African American: 79 mL/min/{1.73_m2} (ref >=60)
GFR, Est Non African American: 68 mL/min/{1.73_m2} (ref >=60)
Globulin: 1.8 g/dL (calc) — ABNORMAL LOW (ref 1.9–3.7)
Glucose, Bld: 98 mg/dL (ref 65–99)
Potassium: 4.8 mmol/L (ref 3.5–5.3)
Sodium: 140 mmol/L (ref 135–146)
Total Bilirubin: 0.7 mg/dL (ref 0.2–1.2)
Total Protein: 6.4 g/dL (ref 6.1–8.1)

## 2020-01-26 LAB — PSA: PSA: 0.8 ng/mL (ref <=4.0)

## 2020-01-26 LAB — CBC
HCT: 43.7 % (ref 38.5–50.0)
Hemoglobin: 14.9 g/dL (ref 13.2–17.1)
MCH: 30.7 pg (ref 27.0–33.0)
MCHC: 34.1 g/dL (ref 32.0–36.0)
MCV: 89.9 fL (ref 80.0–100.0)
MPV: 10.5 fL (ref 7.5–12.5)
Platelets: 264 10*3/uL (ref 140–400)
RBC: 4.86 10*6/uL (ref 4.20–5.80)
RDW: 13.1 % (ref 11.0–15.0)
WBC: 4.2 10*3/uL (ref 3.8–10.8)

## 2020-01-26 LAB — LIPID PANEL
Cholesterol: 235 mg/dL — ABNORMAL HIGH (ref <200)
HDL: 63 mg/dL (ref >=40)
LDL Cholesterol (Calc): 149 mg/dL (calc) — ABNORMAL HIGH
Non-HDL Cholesterol (Calc): 172 mg/dL (calc) — ABNORMAL HIGH (ref <130)
Total CHOL/HDL Ratio: 3.7 (calc) (ref <5.0)
Triglycerides: 116 mg/dL (ref <150)

## 2020-01-26 LAB — TSH: TSH: 1.68 mIU/L (ref 0.40–4.50)

## 2020-01-30 ENCOUNTER — Ambulatory Visit (INDEPENDENT_AMBULATORY_CARE_PROVIDER_SITE_OTHER): Payer: 59 | Admitting: Family Medicine

## 2020-01-30 ENCOUNTER — Encounter: Payer: Self-pay | Admitting: Family Medicine

## 2020-01-30 DIAGNOSIS — I809 Phlebitis and thrombophlebitis of unspecified site: Secondary | ICD-10-CM | POA: Diagnosis not present

## 2020-01-30 NOTE — Patient Instructions (Signed)
Apply heat to area You may use ibuprofen OR aspirin to help with this.  Let me know if worsening or not improving.   Phlebitis Phlebitis is soreness and swelling (inflammation) of a vein. This can occur in your arms, legs, or torso (trunk), as well as deeper inside your body. Phlebitis is usually not serious when it occurs close to the surface of the body. However, it can cause serious problems when it occurs deeper inside the body. What are the causes? Phlebitis can be caused by:  Having a needle, IV tube, or long, thin tube (catheter) put in the vein.  Getting certain medicines or solutions through an IV tube. Some medicines or solutions, such as antibiotics and cancer medicines, can irritate the vein.  Having an IV tube for a long period of time.  Having an IV placed on the part of the arm or hand that moves a lot.  A blood clot.  Infection of the vein.  Surgery on a vein. What increases the risk? The following factors may make you more likely to develop this condition:  Being overweight or obese.  Pregnancy.  Cancer.  Reduced or slowing of blood flow through your veins. This can be caused by: ? Bed rest over a long period of time. ? Long distance travel. ? Injury. ? Surgery. ? Congestive heart failure. ? Inactivity.  Smoking.  Taking birth control pills or hormone replacement therapy.  Varicose veins.  Inflammatory diseases or blood disorders that increase clotting.  Taking illegal drugs through the vein.  Having a history of blood clots. What are the signs or symptoms? Symptoms of this condition include:  A red, tender, swollen, and painful area on your skin. Usually, the area is long and narrow, and may spread.  The affected area feeling warm to the touch.  Firmness along the center of the affected area.  Low fever. How is this diagnosed? This condition is diagnosed based on:  Your symptoms.  A physical exam.  Your medical history, including  your family history. Other tests may be done to rule out other conditions, such as blood clots, especially if you have a history of blood clots or are at higher risk of developing blood clots. These may include:  Blood tests.  Ultrasound exam.  Genetic tests.  Biopsy. This is when a tissue is taken from the body for examination. This is rare. How is this treated? Treatment depends on the severity of the condition as well as the location of the inflammation. In most cases, the condition is minor and gets better quickly. Treatment may include:  Applying a warm compress or heating pad.  Wearing compression stockings or bandages.  Medicines, such as: ? Anti-inflammatory medicines. ? Antibiotic medicines if an infection is present. ? Blood-thinning medicines if a blood clot is suspected or present, or if you have a history of blood clots or a blood disorder.  Removing an IV tube that may be causing the problem.  Using a different medicine or solution that will not irritate the vein. In rare cases, surgery may be needed to remove a damaged section of a vein. Follow these instructions at home: Managing pain, stiffness, and swelling  If directed, apply heat to the affected area as often as told by your health care provider. Use the heat source that your health care provider recommends, such as a moist heat pack or a heating pad. ? Place a towel between your skin and the heat source. ? Leave the heat on  for 20-30 minutes. ? Remove the heat if your skin turns bright red. This is especially important if you are unable to feel pain, heat, or cold. You may have a greater risk of getting burned.  Raise (elevate) the affected area above the level of your heart while you are sitting or lying down. Medicines  Take over-the-counter and prescription medicines only as told by your health care provider.  If you were prescribed an antibiotic, take it as told by your health care provider. Do not stop  using the antibiotic even if you start to feel better.  Carry a medical alert card or wear your medical alert jewelry to show that you are on blood thinners, if this applies. General instructions   If you have phlebitis in your legs: ? Avoid sitting or standing for a long time. ? Keep your legs moving. ? Try to take short walks to break up long periods of sitting. ? Try to avoid bed rest that lasts for long periods of time. Regular sleep is not part of bed rest.  Wear compression stockings as told by your health care provider. These stockings reduce swelling in your legs and help to prevent blood clots. They also help to prevent the condition from coming back.  Do not use any products that contain nicotine or tobacco, such as cigarettes and e-cigarettes. If you need help quitting, ask your health care provider.  Keep all follow-up visits as told by your health care provider. This is important. This may include any follow-up blood tests. Contact a health care provider if:  You have unusual bruising or any bleeding problems.  Your symptoms do not get better, or your symptoms get worse.  You are on anti-inflammatory medicines and you develop abdominal pain. Get help right away if:  You suddenly have chest pain or trouble breathing.  You have a fever and your symptoms suddenly get worse.  You cough up blood.  You feel lightheaded or pass out.  You have severe pain and swelling in the affected arm or leg. These symptoms may represent a serious problem that is an emergency. Do not wait to see if the symptoms will go away. Get medical help right away. Call your local emergency services (911 in the [place] Do not drive yourself to the hospital. Summary  Phlebitis is soreness and swelling (inflammation) of a vein.  Phlebitis is usually not serious when it occurs close to the surface of the body. However, it can cause serious problems when it occurs deeper inside the body.  Treatment  depends on the severity of the condition and the location of the inflammation.  Raise (elevate) the affected area above the level of your heart while you are sitting or lying down. This information is not intended to replace advice given to you by your health care provider. Make sure you discuss any questions you have with your health care provider. Document Revised: 10/08/2018 Document Reviewed: 10/03/2016 Elsevier Patient Education  2020 [employer].

## 2020-02-01 DIAGNOSIS — I809 Phlebitis and thrombophlebitis of unspecified site: Secondary | ICD-10-CM | POA: Insufficient documentation

## 2020-02-01 NOTE — Progress Notes (Signed)
Danny Cohen - 54 y.o. male MRN [id number]  Date of birth: July 04, 1966  Subjective Chief Complaint  Patient presents with  . Bleeding/Bruising    HPI Danny Cohen is a 54 y.o. male here today with L arm pain.  He had labs completed earlier this week.  Following day had bruising as well as warmth and firmness along vein.  He has mild stiffness of arm. He denies swelling, fever, or chills.  He has tried icing at home.   ROS:  A comprehensive ROS was completed and negative except as noted per HPI  Allergies  Allergen Reactions  . Nsaids Other (See Comments)    Pt has history of peptic ulcer     Past Medical History:  Diagnosis Date  . Chronic back pain greater than 3 months duration 09/03/2011  . Esophageal reflux disease 09/03/2011  . Gastric ulcer due to Helicobacter pylori [id number]  . Peptic ulcer due to Helicobacter pylori [id number]    Past Surgical History:  Procedure Laterality Date  . HERNIA REPAIR    . LAPAROSCOPIC INGUINAL HERNIA REPAIR  04/23/2012   bilateral    Social History   Socioeconomic History  . Marital status: Married    Spouse name: Not on file  . Number of children: Not on file  . Years of education: Not on file  . Highest education level: Not on file  Occupational History  . Not on file  Tobacco Use  . Smoking status: Former Smoker    Quit date: 09/11/2000    Years since quitting: 19.4  . Smokeless tobacco: Never Used  Substance and Sexual Activity  . Alcohol use: No  . Drug use: No  . Sexual activity: Not on file  Other Topics Concern  . Not on file  Social History Narrative  . Not on file   Social Determinants of Health   Financial Resource Strain:   . Difficulty of Paying Living Expenses:   Food Insecurity:   . Worried About Charity fundraiser in the Last Year:   . Arboriculturist in the Last Year:   Transportation Needs:   . Film/video editor (Medical):   Marland Kitchen Lack of Transportation (Non-Medical):   Physical Activity:    . Days of Exercise per Week:   . Minutes of Exercise per Session:   Stress:   . Feeling of Stress :   Social Connections:   . Frequency of Communication with Friends and Family:   . Frequency of Social Gatherings with Friends and Family:   . Attends Religious Services:   . Active Member of Clubs or Organizations:   . Attends Archivist Meetings:   Marland Kitchen Marital Status:     Family History  Problem Relation Age of Onset  . Heart failure Mother     Health Maintenance  Topic Date Due  . HIV Screening  Never done  . COVID-19 Vaccine (1) 04/11/2020 (Originally 01/05/1980)  . INFLUENZA VACCINE  04/11/2020  . TETANUS/TDAP  08/30/2021  . Fecal DNA (Cologuard)  12/03/2022     ----------------------------------------------------------------------------------------------------------------------------------------------------------------------------------------------------------------- Physical Exam BP 130/70 (BP Location: Right Arm, Patient Position: Sitting, Cuff Size: Normal)   Pulse 69   Ht 5' 6.14" (1.68 m)   Wt 162 lb 8 oz (73.7 kg)   SpO2 98%   BMI 26.12 kg/m   Physical Exam Constitutional:      Appearance: Normal appearance.  Cardiovascular:     Rate and Rhythm: Normal rate and regular rhythm.  Pulmonary:  Effort: Pulmonary effort is normal.     Breath sounds: Normal breath sounds.  Musculoskeletal:     Cervical back: Neck supple.  Skin:    Comments: Bruising with ttp, mild warmth and mild firmness along L AC area.   Neurological:     General: No focal deficit present.     Mental Status: He is alert.  Psychiatric:        Mood and Affect: Mood normal.        Behavior: Behavior normal.     ------------------------------------------------------------------------------------------------------------------------------------------------------------------------------------------------------------------- Assessment and Plan  Phlebitis Recommend application of  heat to area  Can add anti-inflammatory such as ibuprofen or ASA.  He will contact us if continues to worsen of not resolving.    No orders of the defined types were placed in this encounter.   No follow-ups on file.    This visit occurred during the SARS-CoV-2 public health emergency.  Safety protocols were in place, including screening questions prior to the visit, additional usage of staff PPE, and extensive cleaning of exam room while observing appropriate contact time as indicated for disinfecting solutions.

## 2020-02-01 NOTE — Assessment & Plan Note (Signed)
Recommend application of heat to area  Can add anti-inflammatory such as ibuprofen or ASA.  He will contact us if continues to worsen of not resolving.

## 2020-02-02 ENCOUNTER — Other Ambulatory Visit: Payer: Self-pay | Admitting: Family Medicine

## 2020-02-04 ENCOUNTER — Other Ambulatory Visit: Payer: Self-pay

## 2020-02-04 MED ORDER — DOXYCYCLINE HYCLATE 100 MG PO TABS: 100.0000 mg | ORAL_TABLET | Freq: Every day | ORAL | 3 refills | Status: DC

## 2020-03-18 ENCOUNTER — Other Ambulatory Visit: Payer: Self-pay

## 2020-03-18 ENCOUNTER — Encounter: Payer: Self-pay | Admitting: Family Medicine

## 2020-03-18 ENCOUNTER — Ambulatory Visit (INDEPENDENT_AMBULATORY_CARE_PROVIDER_SITE_OTHER): Payer: No Typology Code available for payment source | Admitting: Family Medicine

## 2020-03-18 VITALS — BP 113/65 | HR 64 | Ht 66.14 in | Wt 165.5 lb

## 2020-03-18 DIAGNOSIS — M5481 Occipital neuralgia: Secondary | ICD-10-CM | POA: Insufficient documentation

## 2020-03-18 DIAGNOSIS — L739 Follicular disorder, unspecified: Secondary | ICD-10-CM

## 2020-03-18 DIAGNOSIS — R21 Rash and other nonspecific skin eruption: Secondary | ICD-10-CM | POA: Diagnosis not present

## 2020-03-18 NOTE — Patient Instructions (Signed)
Occipital Neuralgia  Occipital neuralgia is a type of headache that causes brief episodes of very bad pain in the back of your head. Pain from occipital neuralgia may spread (radiate) to other parts of your head. These headaches may be caused by irritation of the nerves that leave your spinal cord high up in your neck, just below the base of your skull (occipital nerves). Your occipital nerves transmit sensations from the back of your head, the top of your head, and the areas behind your ears. What are the causes? This condition can occur without any known cause (primary headache syndrome). In other cases, this condition is caused by pressure on or irritation of one of the two occipital nerves. Pressure and irritation may be due to:  Muscle spasm in the neck.  Neck injury.  Wear and tear of the vertebrae in the neck (osteoarthritis).  Disease of the disks that separate the vertebrae.  Swollen blood vessels that put pressure on the occipital nerves.  Infections.  Tumors.  Diabetes. What are the signs or symptoms? This condition causes brief burning, stabbing, electric, shocking, or shooting pain which can radiate to the top of the head. It can happen on one side or both sides of the head. It can also cause:  Pain behind the eye.  Pain triggered by neck movement or hair brushing.  Scalp tenderness.  Aching in the back of the head between episodes of very bad pain.  Pain gets worse with exposure to bright lights. How is this diagnosed? There is no test that diagnoses this condition. Your health care provider may diagnose this condition based on a physical exam and your symptoms. Other tests may be done, such as:  Imaging studies of the brain and neck (cervical spine), such as an MRI or CT scan. These look for causes of pinched nerves.  Applying pressure to the nerves in the neck to try to re-create the pain.  Injection of numbing medicine into the occipital nerve areas to see if  pain goes away (diagnostic nerve block). How is this treated? Treatment for this condition may begin with simple measures, such as:  Rest.  Massage.  Applying heat or cold on the area.  Over-the-counter pain relievers. If these measures do not work, you may need other treatments, including:  Medicines, such as: ? Prescription-strength anti-inflammatory medicines. ? Muscle relaxants. ? Anti-seizure medicines, which can relieve pain. ? Antidepressants, which can relieve pain. ? Injected medicines, such as medicines that numb the area (local anesthetic) and steroids.  Pulsed radiofrequency ablation. This is when wires are implanted to deliver electrical impulses that block pain signals from the occipital nerve.  Surgery to relieve nerve pressure.  Physical therapy. Follow these instructions at home: Pain management      Avoid any activities that cause pain.  Rest when you have an attack of pain.  Try gentle massage to relieve pain.  Try a different pillow or sleeping position.  If directed, apply heat to the affected area as told by your health care provider. Use the heat source that your health care provider recommends, such as a moist heat pack or a heating pad. ? Place a towel between your skin and the heat source. ? Leave the heat on for 20-30 minutes. ? Remove the heat if your skin turns bright red. This is especially important if you are unable to feel pain, heat, or cold. You may have a greater risk of getting burned.  If directed, apply ice to the   back of the head and neck area as told by your health care provider. ? Put ice in a plastic bag. ? Place a towel between your skin and the bag. ? Leave the ice on for 20 minutes, 2-3 times per day. General instructions  Take over-the-counter and prescription medicines only as told by your health care provider.  Avoid things that make your symptoms worse, such as bright lights.  Try to stay active. Get regular  exercise that does not cause pain. Ask your health care provider to suggest safe exercises for you.  Work with a physical therapist to learn stretching exercises you can do at home.  Practice good posture.  Keep all follow-up visits as told by your health care provider. This is important. Contact a health care provider if:  Your medicine is not working.  You have new or worsening symptoms. Get help right away if:  You have very bad head pain that does not go away.  You have a sudden change in vision, balance, or speech. Summary  Occipital neuralgia is a type of headache that causes brief episodes of very bad pain in the back of your head.  Pain from occipital neuralgia may spread (radiate) to other parts of your head.  Treatment for this condition includes rest, massage, and medicines. This information is not intended to replace advice given to you by your health care provider. Make sure you discuss any questions you have with your health care provider. Document Revised: 08/14/2017 Document Reviewed: 11/02/2016 Elsevier Patient Education  2020 Elsevier Inc.  

## 2020-03-18 NOTE — Assessment & Plan Note (Signed)
Some improvement with doxycycline but has never had full resolution.  Referral entered to dermatology.

## 2020-03-18 NOTE — Progress Notes (Signed)
Danny Cohen. Fiola - 54 y.o. male MRN [id number]  Date of birth: 16-May-1966  Subjective Chief Complaint  Patient presents with  . Nerve Pain    HPI Danny Cohen is a 54 y.o. male with history of cervical radiculopathy here today with complaint of neck and head pain.  He reports that a few days ago he noted pain that started at the top of his neck and radiated to the top of his head and behind the ear. He describes as a sharp, stabbing pain.  Pain lasted about 24 hours and was not relieved by gabapentin or ibuprofen.  Symptoms have resolved at this point.  It was different than his typical neck pain.   He had been digging several holes in his yard prior to onset of symptoms.  He denies fever, chills, numbness or tingling.   He also has continued rash on his torso.  It has improved with doxycycline but never resolved.  It is somewhat itchy.   ROS:  A comprehensive ROS was completed and negative except as noted per HPI  Allergies  Allergen Reactions  . Nsaids Other (See Comments)    Pt has history of peptic ulcer     Past Medical History:  Diagnosis Date  . Chronic back pain greater than 3 months duration 09/03/2011  . Esophageal reflux disease 09/03/2011  . Gastric ulcer due to Helicobacter pylori 09/32/3557  . Peptic ulcer due to Helicobacter pylori 32/20/2542    Past Surgical History:  Procedure Laterality Date  . HERNIA REPAIR    . LAPAROSCOPIC INGUINAL HERNIA REPAIR  04/23/2012   bilateral    Social History   Socioeconomic History  . Marital status: Married    Spouse name: Not on file  . Number of children: Not on file  . Years of education: Not on file  . Highest education level: Not on file  Occupational History  . Not on file  Tobacco Use  . Smoking status: Former Smoker    Quit date: 09/11/2000    Years since quitting: 19.5  . Smokeless tobacco: Never Used  Substance and Sexual Activity  . Alcohol use: No  . Drug use: No  . Sexual activity: Not on file  Other  Topics Concern  . Not on file  Social History Narrative  . Not on file   Social Determinants of Health   Financial Resource Strain:   . Difficulty of Paying Living Expenses:   Food Insecurity:   . Worried About Charity fundraiser in the Last Year:   . Arboriculturist in the Last Year:   Transportation Needs:   . Film/video editor (Medical):   Marland Kitchen Lack of Transportation (Non-Medical):   Physical Activity:   . Days of Exercise per Week:   . Minutes of Exercise per Session:   Stress:   . Feeling of Stress :   Social Connections:   . Frequency of Communication with Friends and Family:   . Frequency of Social Gatherings with Friends and Family:   . Attends Religious Services:   . Active Member of Clubs or Organizations:   . Attends Archivist Meetings:   Marland Kitchen Marital Status:     Family History  Problem Relation Age of Onset  . Heart failure Mother     Health Maintenance  Topic Date Due  . Hepatitis C Screening  Never done  . HIV Screening  Never done  . COVID-19 Vaccine (1) 04/11/2020 (Originally 01/05/1980)  . INFLUENZA  VACCINE  04/11/2020  . TETANUS/TDAP  08/30/2021  . Fecal DNA (Cologuard)  12/03/2022     ----------------------------------------------------------------------------------------------------------------------------------------------------------------------------------------------------------------- Physical Exam BP 113/65 (BP Location: Left Arm, Patient Position: Sitting, Cuff Size: Normal)   Pulse 64   Ht 5' 6.14" (1.68 m)   Wt 165 lb 8 oz (75.1 kg)   SpO2 99%   BMI 26.60 kg/m   Physical Exam Constitutional:      Appearance: Normal appearance.  Eyes:     General: No scleral icterus. Cardiovascular:     Rate and Rhythm: Normal rate and regular rhythm.  Pulmonary:     Effort: Pulmonary effort is normal.     Breath sounds: Normal breath sounds.  Musculoskeletal:     Cervical back: Neck supple.  Skin:    General: Skin is warm.      Findings: Rash (scattered pustules on trunk) present.  Neurological:     General: No focal deficit present.     Mental Status: He is alert and oriented to person, place, and time.     Cranial Nerves: No cranial nerve deficit.  Psychiatric:        Mood and Affect: Mood normal.        Behavior: Behavior normal.     ------------------------------------------------------------------------------------------------------------------------------------------------------------------------------------------------------------------- Assessment and Plan  Occipital neuralgia Symptoms have resolved at this point but sound like occipital neuralgia based on description.  Possibly related to spasm due to overuse from digging holes in his yard.  He will let me know if symptoms return.  Folliculitis Some improvement with doxycycline but has never had full resolution.  Referral entered to dermatology.    No orders of the defined types were placed in this encounter.   No follow-ups on file.    This visit occurred during the SARS-CoV-2 public health emergency.  Safety protocols were in place, including screening questions prior to the visit, additional usage of staff PPE, and extensive cleaning of exam room while observing appropriate contact time as indicated for disinfecting solutions.

## 2020-03-18 NOTE — Assessment & Plan Note (Signed)
Symptoms have resolved at this point but sound like occipital neuralgia based on description.  Possibly related to spasm due to overuse from digging holes in his yard.  He will let me know if symptoms return.

## 2020-05-27 ENCOUNTER — Ambulatory Visit (INDEPENDENT_AMBULATORY_CARE_PROVIDER_SITE_OTHER): Payer: No Typology Code available for payment source

## 2020-05-27 ENCOUNTER — Other Ambulatory Visit: Payer: Self-pay

## 2020-05-27 ENCOUNTER — Encounter: Payer: Self-pay | Admitting: Family Medicine

## 2020-05-27 ENCOUNTER — Ambulatory Visit (INDEPENDENT_AMBULATORY_CARE_PROVIDER_SITE_OTHER): Payer: No Typology Code available for payment source | Admitting: Family Medicine

## 2020-05-27 VITALS — BP 123/64 | HR 84 | Wt 165.5 lb

## 2020-05-27 DIAGNOSIS — M79674 Pain in right toe(s): Secondary | ICD-10-CM

## 2020-05-27 DIAGNOSIS — M5412 Radiculopathy, cervical region: Secondary | ICD-10-CM | POA: Diagnosis not present

## 2020-05-27 MED ORDER — GABAPENTIN 300 MG PO CAPS: 600.0000 mg | ORAL_CAPSULE | Freq: Three times a day (TID) | ORAL | 3 refills | Status: DC

## 2020-05-27 NOTE — Progress Notes (Signed)
Danny Cohen - 54 y.o. male MRN [id number]  Date of birth: July 21, 1966  Subjective Chief Complaint  Patient presents with  . Toe Injury    HPI Danny Cohen is a 54 y.o. male here today with complaint of pain in the R great toe.  He was under crawl space of a house and hit toe on concrete.  Had mild pain initially with pain that has persisted since that time.  He does have some mild swelling without bruising, redness, warmth.  He does get some relief with ibuprofen.  Pain is worse when he is walking.   He also reports that gabapentin for his neck is not quite as effective as when he first started.  He is needing more ibuprofen for management of his pain.  He would like to make adjustment to this if possible.   ROS:  A comprehensive ROS was completed and negative except as noted per HPI  Allergies  Allergen Reactions  . Nsaids Other (See Comments)    Pt has history of peptic ulcer     Past Medical History:  Diagnosis Date  . Chronic back pain greater than 3 months duration 09/03/2011  . Esophageal reflux disease 09/03/2011  . Gastric ulcer due to Helicobacter pylori 19/14/7829  . Peptic ulcer due to Helicobacter pylori 56/21/3086    Past Surgical History:  Procedure Laterality Date  . HERNIA REPAIR    . LAPAROSCOPIC INGUINAL HERNIA REPAIR  04/23/2012   bilateral    Social History   Socioeconomic History  . Marital status: Married    Spouse name: Not on file  . Number of children: Not on file  . Years of education: Not on file  . Highest education level: Not on file  Occupational History  . Not on file  Tobacco Use  . Smoking status: Former Smoker    Quit date: 09/11/2000    Years since quitting: 19.7  . Smokeless tobacco: Never Used  Substance and Sexual Activity  . Alcohol use: No  . Drug use: No  . Sexual activity: Not on file  Other Topics Concern  . Not on file  Social History Narrative  . Not on file   Social Determinants of Health   Financial Resource  Strain:   . Difficulty of Paying Living Expenses: Not on file  Food Insecurity:   . Worried About Charity fundraiser in the Last Year: Not on file  . Ran Out of Food in the Last Year: Not on file  Transportation Needs:   . Lack of Transportation (Medical): Not on file  . Lack of Transportation (Non-Medical): Not on file  Physical Activity:   . Days of Exercise per Week: Not on file  . Minutes of Exercise per Session: Not on file  Stress:   . Feeling of Stress : Not on file  Social Connections:   . Frequency of Communication with Friends and Family: Not on file  . Frequency of Social Gatherings with Friends and Family: Not on file  . Attends Religious Services: Not on file  . Active Member of Clubs or Organizations: Not on file  . Attends Archivist Meetings: Not on file  . Marital Status: Not on file    Family History  Problem Relation Age of Onset  . Heart failure Mother     Health Maintenance  Topic Date Due  . Hepatitis C Screening  Never done  . COVID-19 Vaccine (1) Never done  . HIV Screening  Never  done  . INFLUENZA VACCINE  04/11/2020  . TETANUS/TDAP  08/30/2021  . Fecal DNA (Cologuard)  12/03/2022     ----------------------------------------------------------------------------------------------------------------------------------------------------------------------------------------------------------------- Physical Exam BP 123/64 (BP Location: Left Arm, Patient Position: Sitting, Cuff Size: Normal)   Pulse 84   Wt 165 lb 8 oz (75.1 kg)   SpO2 98%   BMI 26.60 kg/m   Physical Exam Constitutional:      Appearance: Normal appearance.  Musculoskeletal:     Comments: TTP at MTP joint R great toe.  Mild swelling without erythema or warmth.  ROM is normal.    Neurological:     General: No focal deficit present.     Mental Status: He is alert.  Psychiatric:        Mood and Affect: Mood normal.        Behavior: Behavior normal.      ------------------------------------------------------------------------------------------------------------------------------------------------------------------------------------------------------------------- Assessment and Plan  Great toe pain, right Xrays ordered. Recommend icing at the end of each day.  He will continue ibuprofen as needed.  May add voltaren gel as well.     Cervical radiculopathy He is having increased symptoms, will increase gabapentin to 600mg  tid.  He will let me know if this change is not helpful.    Meds ordered this encounter  Medications  . gabapentin (NEURONTIN) 300 MG capsule    Sig: Take 2 capsules (600 mg total) by mouth 3 (three) times daily.    Dispense:  180 capsule    Refill:  3   Orders Placed This Encounter  Procedures  . DG Foot Complete Right    Standing Status:   Future    Standing Expiration Date:   05/27/2021    Order Specific Question:   Reason for Exam (SYMPTOM  OR DIAGNOSIS REQUIRED)    Answer:   Right great toe pain    Order Specific Question:   Preferred imaging location?    Answer:   [facility]    Order Specific Question:   Radiology Contrast Protocol - do NOT remove file path    Answer:   [email or web address]     No follow-ups on file.    This visit occurred during the SARS-CoV-2 public health emergency.  Safety protocols were in place, including screening questions prior to the visit, additional usage of staff PPE, and extensive cleaning of exam room while observing appropriate contact time as indicated for disinfecting solutions.

## 2020-05-27 NOTE — Assessment & Plan Note (Signed)
He is having increased symptoms, will increase gabapentin to 600mg  tid.  He will let me know if this change is not helpful.

## 2020-05-27 NOTE — Assessment & Plan Note (Signed)
Xrays ordered. Recommend icing at the end of each day.  He will continue ibuprofen as needed.  May add voltaren gel as well.

## 2020-05-27 NOTE — Patient Instructions (Addendum)
Have xray completed.  Try voltaren gel to toe-available over the counter.  You can increase gabapentin to 600mg  three times per day.

## 2020-06-02 ENCOUNTER — Other Ambulatory Visit: Payer: Self-pay | Admitting: Family Medicine

## 2020-06-16 ENCOUNTER — Other Ambulatory Visit: Payer: Self-pay | Admitting: Family Medicine

## 2020-06-22 ENCOUNTER — Other Ambulatory Visit: Payer: Self-pay

## 2020-06-22 ENCOUNTER — Encounter: Payer: Self-pay | Admitting: Family Medicine

## 2020-06-22 ENCOUNTER — Ambulatory Visit (INDEPENDENT_AMBULATORY_CARE_PROVIDER_SITE_OTHER): Payer: No Typology Code available for payment source | Admitting: Family Medicine

## 2020-06-22 DIAGNOSIS — M79674 Pain in right toe(s): Secondary | ICD-10-CM

## 2020-06-22 DIAGNOSIS — L739 Follicular disorder, unspecified: Secondary | ICD-10-CM

## 2020-06-22 DIAGNOSIS — M5412 Radiculopathy, cervical region: Secondary | ICD-10-CM | POA: Diagnosis not present

## 2020-06-22 MED ORDER — GABAPENTIN 300 MG PO CAPS: 600.0000 mg | ORAL_CAPSULE | Freq: Three times a day (TID) | ORAL | 1 refills | Status: DC

## 2020-06-22 NOTE — Progress Notes (Signed)
Danny Cohen. Danny Cohen - 54 y.o. male MRN [id number]  Date of birth: [birth year]  Subjective Chief Complaint  Patient presents with  . Follow-up    HPI Danny Tursi. Cohen is a 54 y.o. male here today for follow up of cervical radiculopathy.  He has been managing this well with gabapentin.  We had increased to 600mg  TID however he has had issues with getting correct number of capsules from the pharmacy.  They are only filling #90 for 30 days so he has only been using 300mg  TID.  He has needed to use ibuprofen more often due to this.    He did see dermatology regarding rash.  Had biopsy which showed staph folliculitis.  He was continued on doxycycline.   He also has continued great toe pain on the R foot.  Previous xray without fracture but did have some degenerative changes of MTP joint.   ROS:  A comprehensive ROS was completed and negative except as noted per HPI  Allergies  Allergen Reactions  . Nsaids Other (See Comments)    Pt has history of peptic ulcer     Past Medical History:  Diagnosis Date  . Chronic back pain greater than 3 months duration 09/03/2011  . Esophageal reflux disease 09/03/2011  . Gastric ulcer due to Helicobacter pylori 40/98/1191  . Peptic ulcer due to Helicobacter pylori 47/82/9562    Past Surgical History:  Procedure Laterality Date  . HERNIA REPAIR    . LAPAROSCOPIC INGUINAL HERNIA REPAIR  04/23/2012   bilateral    Social History   Socioeconomic History  . Marital status: Married    Spouse name: Not on file  . Number of children: Not on file  . Years of education: Not on file  . Highest education level: Not on file  Occupational History  . Not on file  Tobacco Use  . Smoking status: Former Smoker    Quit date: 09/11/2000    Years since quitting: 19.7  . Smokeless tobacco: Never Used  Substance and Sexual Activity  . Alcohol use: No  . Drug use: No  . Sexual activity: Not on file  Other Topics Concern  . Not on file  Social History Narrative  .  Not on file   Social Determinants of Health   Financial Resource Strain:   . Difficulty of Paying Living Expenses: Not on file  Food Insecurity:   . Worried About Charity fundraiser in the Last Year: Not on file  . Ran Out of Food in the Last Year: Not on file  Transportation Needs:   . Lack of Transportation (Medical): Not on file  . Lack of Transportation (Non-Medical): Not on file  Physical Activity:   . Days of Exercise per Week: Not on file  . Minutes of Exercise per Session: Not on file  Stress:   . Feeling of Stress : Not on file  Social Connections:   . Frequency of Communication with Friends and Family: Not on file  . Frequency of Social Gatherings with Friends and Family: Not on file  . Attends Religious Services: Not on file  . Active Member of Clubs or Organizations: Not on file  . Attends Archivist Meetings: Not on file  . Marital Status: Not on file    Family History  Problem Relation Age of Onset  . Heart failure Mother     Health Maintenance  Topic Date Due  . Hepatitis C Screening  Never done  . COVID-19 Vaccine (1)  Never done  . HIV Screening  Never done  . INFLUENZA VACCINE  04/11/2020  . TETANUS/TDAP  08/30/2021  . Fecal DNA (Cologuard)  12/03/2022     ----------------------------------------------------------------------------------------------------------------------------------------------------------------------------------------------------------------- Physical Exam BP 109/67 (BP Location: Left Arm, Patient Position: Sitting, Cuff Size: Normal)   Pulse (!) 57   Wt 162 lb 8 oz (73.7 kg)   SpO2 100%   BMI 26.12 kg/m   Physical Exam Constitutional:      Appearance: Normal appearance.  Eyes:     General: No scleral icterus. Cardiovascular:     Rate and Rhythm: Normal rate and regular rhythm.  Pulmonary:     Effort: Pulmonary effort is normal.     Breath sounds: Normal breath sounds.  Musculoskeletal:     Cervical back:  Neck supple.  Skin:    General: Skin is warm and dry.  Neurological:     General: No focal deficit present.     Mental Status: He is alert.  Psychiatric:        Mood and Affect: Mood normal.        Behavior: Behavior normal.     ------------------------------------------------------------------------------------------------------------------------------------------------------------------------------------------------------------------- Assessment and Plan  Great toe pain, right He will plan to make appt with Dr. Darene Lamer for this.   Cervical radiculopathy This has been well controlled with gabapentin however has had issues obtaining medication.  Will send new rx again for updated rx and quantity.  He will let me know if he continues to have issues filling this.  Follow up in 6 months.   Folliculitis He will continue doxycycline as needed.    Meds ordered this encounter  Medications  . gabapentin (NEURONTIN) 300 MG capsule    Sig: Take 2 capsules (600 mg total) by mouth 3 (three) times daily.    Dispense:  540 capsule    Refill:  1    Return in about 6 months (around 12/21/2020) for neck pain.    This visit occurred during the SARS-CoV-2 public health emergency.  Safety protocols were in place, including screening questions prior to the visit, additional usage of staff PPE, and extensive cleaning of exam room while observing appropriate contact time as indicated for disinfecting solutions.

## 2020-06-22 NOTE — Patient Instructions (Signed)
Schedule appt with Dr. Darene Lamer for the toe.  I have sent over updated gabapentin prescription.

## 2020-06-22 NOTE — Assessment & Plan Note (Signed)
He will continue doxycycline as needed.

## 2020-06-22 NOTE — Assessment & Plan Note (Signed)
He will plan to make appt with Dr. Darene Lamer for this.

## 2020-06-22 NOTE — Assessment & Plan Note (Signed)
This has been well controlled with gabapentin however has had issues obtaining medication.  Will send new rx again for updated rx and quantity.  He will let me know if he continues to have issues filling this.  Follow up in 6 months.

## 2020-07-02 ENCOUNTER — Ambulatory Visit (INDEPENDENT_AMBULATORY_CARE_PROVIDER_SITE_OTHER): Payer: No Typology Code available for payment source | Admitting: Sports Medicine

## 2020-07-02 ENCOUNTER — Other Ambulatory Visit: Payer: Self-pay

## 2020-07-02 DIAGNOSIS — M19071 Primary osteoarthritis, right ankle and foot: Secondary | ICD-10-CM

## 2020-07-02 MED ORDER — CELECOXIB 100 MG PO CAPS: ORAL_CAPSULE | ORAL | 6 refills | Status: DC

## 2020-07-02 MED ORDER — ESOMEPRAZOLE MAGNESIUM 40 MG PO CPDR: DELAYED_RELEASE_CAPSULE | ORAL | 3 refills | Status: DC

## 2020-07-02 NOTE — Progress Notes (Signed)
    Procedures performed today:    None.  Independent interpretation of notes and tests performed by another provider:   X-rays personally reviewed, first MTP osteoarthritis, no fractures or acute changes.  Brief History, Exam, Impression, and Recommendations:    Arthritis of first metatarsophalangeal (MTP) joint of right foot Danny Cohen is a pleasant 54 year old male, he recalls kicking an object by accident, subsequently he felt pain at his right first MTP. Significant rigidity. X-rays confirmed the diagnosis. He does have a history of a bleeding gastric ulcer so we will be very judicious with NSAIDs, starting with low-dose Celebrex as well as Nexium for 2 weeks. I placed the first metatarsal ray post in his shoe and gave him additional material to place this in his other shoes as well. Return to see me in 4 weeks, injection if no better.    ___________________________________________ Gwen Her. Dianah Field, M.D., ABFM., CAQSM. Primary Care and [place] Instructor of [place] [facility]

## 2020-07-02 NOTE — Assessment & Plan Note (Signed)
Danny Cohen is a pleasant 54 year old male, he recalls kicking an object by accident, subsequently he felt pain at his right first MTP. Significant rigidity. X-rays confirmed the diagnosis. He does have a history of a bleeding gastric ulcer so we will be very judicious with NSAIDs, starting with low-dose Celebrex as well as Nexium for 2 weeks. I placed the first metatarsal ray post in his shoe and gave him additional material to place this in his other shoes as well. Return to see me in 4 weeks, injection if no better.

## 2020-07-06 ENCOUNTER — Telehealth: Payer: Self-pay | Admitting: Family Medicine

## 2020-07-06 NOTE — Telephone Encounter (Signed)
Received call from [facility] stating Gabapentin needed to have a Prior auth I sent through cover my meds waiting on determination. - CF

## 2020-07-08 ENCOUNTER — Telehealth: Payer: Self-pay

## 2020-07-08 MED ORDER — GABAPENTIN 300 MG PO CAPS: 600.0000 mg | ORAL_CAPSULE | Freq: Three times a day (TID) | ORAL | 1 refills | Status: DC

## 2020-07-08 NOTE — Telephone Encounter (Signed)
Rx approved

## 2020-07-08 NOTE — Telephone Encounter (Signed)
Gianno states the PA was approved for the gabapentin. He is now in [place] and needs the prescription sent to the [facility].

## 2020-07-09 NOTE — Telephone Encounter (Signed)
Left message advising patient

## 2020-07-30 ENCOUNTER — Ambulatory Visit (INDEPENDENT_AMBULATORY_CARE_PROVIDER_SITE_OTHER): Payer: No Typology Code available for payment source

## 2020-07-30 ENCOUNTER — Other Ambulatory Visit: Payer: Self-pay

## 2020-07-30 ENCOUNTER — Ambulatory Visit (INDEPENDENT_AMBULATORY_CARE_PROVIDER_SITE_OTHER): Payer: No Typology Code available for payment source | Admitting: Sports Medicine

## 2020-07-30 DIAGNOSIS — M5412 Radiculopathy, cervical region: Secondary | ICD-10-CM | POA: Diagnosis not present

## 2020-07-30 DIAGNOSIS — M25512 Pain in left shoulder: Secondary | ICD-10-CM | POA: Insufficient documentation

## 2020-07-30 DIAGNOSIS — G8929 Other chronic pain: Secondary | ICD-10-CM | POA: Insufficient documentation

## 2020-07-30 DIAGNOSIS — M7542 Impingement syndrome of left shoulder: Secondary | ICD-10-CM

## 2020-07-30 DIAGNOSIS — M19071 Primary osteoarthritis, right ankle and foot: Secondary | ICD-10-CM

## 2020-07-30 DIAGNOSIS — M50323 Other cervical disc degeneration at C6-C7 level: Secondary | ICD-10-CM | POA: Diagnosis not present

## 2020-07-30 NOTE — Assessment & Plan Note (Signed)
Danny Cohen has first MTP confirmed osteoarthritis, we added Nexium with Celebrex to protect him due to history of a bleeding gastric ulcer. He is only had small improvements, we also added a first metatarsal ray post. Because of persistent discomfort we injected his first MTP today. Return in 6 weeks for this, consideration of Morton's plate or even arthroplasty/arthrodesis if no better.

## 2020-07-30 NOTE — Assessment & Plan Note (Signed)
History of left-sided C7 distribution cervical radiculitis, he was seeing Dr. Christella Noa with [place] [facility], we are going to start from scratch, cervical spine x-rays, formal physical therapy. Return to see me in 6 weeks, MRI for interventional planning if no better. Of note he did have an MRI back in 2015 that showed expected degenerative changes.  Has never had cervical epidurals. Continue gabapentin for now.

## 2020-07-30 NOTE — Progress Notes (Signed)
    Procedures performed today:    Procedure: Real-time Ultrasound Guided injection of the right first MTP Device: Samsung HS60  Verbal informed consent obtained.  Time-out conducted.  Noted no overlying erythema, induration, or other signs of local infection.  Skin prepped in a sterile fashion.  Local anesthesia: Topical Ethyl chloride.  With sterile technique and under real time ultrasound guidance:  Noted arthritic spurs, 1/2 cc lidocaine, 1/2 cc kenalog 40 injected easily  completed without difficulty  Advised to call if fevers/chills, erythema, induration, drainage, or persistent bleeding.  Images permanently stored and available for review in PACS.  Impression: Technically successful ultrasound guided injection.  Independent interpretation of notes and tests performed by another provider:   None.  Brief History, Exam, Impression, and Recommendations:    Arthritis of first metatarsophalangeal (MTP) joint of right foot Shahan has first MTP confirmed osteoarthritis, we added Nexium with Celebrex to protect him due to history of a bleeding gastric ulcer. He is only had small improvements, we also added a first metatarsal ray post. Because of persistent discomfort we injected his first MTP today. Return in 6 weeks for this, consideration of Morton's plate or even arthroplasty/arthrodesis if no better.  Cervical radiculopathy History of left-sided C7 distribution cervical radiculitis, he was seeing Dr. Christella Noa with [place] [facility], we are going to start from scratch, cervical spine x-rays, formal physical therapy. Return to see me in 6 weeks, MRI for interventional planning if no better. Of note he did have an MRI back in 2015 that showed expected degenerative changes.  Has never had cervical epidurals. Continue gabapentin for now.  Impingement syndrome, shoulder, left Impingement signs on exam without weakness, minimal labral signs. Adding x-rays, formal PT, explained the  anatomy and mechanism of the rotator cuff, return to see me in 6 weeks, subacromial injection versus MRI if no better.    ___________________________________________ Gwen Her. Dianah Field, M.D., ABFM., CAQSM. Primary Care and [place] Instructor of [place] [employer] [facility]

## 2020-07-30 NOTE — Assessment & Plan Note (Signed)
Impingement signs on exam without weakness, minimal labral signs. Adding x-rays, formal PT, explained the anatomy and mechanism of the rotator cuff, return to see me in 6 weeks, subacromial injection versus MRI if no better.

## 2020-08-03 ENCOUNTER — Other Ambulatory Visit: Payer: Self-pay | Admitting: Sports Medicine

## 2020-08-03 DIAGNOSIS — M19071 Primary osteoarthritis, right ankle and foot: Secondary | ICD-10-CM

## 2020-08-03 MED ORDER — ESOMEPRAZOLE MAGNESIUM 40 MG PO CPDR: DELAYED_RELEASE_CAPSULE | ORAL | 3 refills | Status: DC

## 2020-08-03 MED ORDER — CELECOXIB 100 MG PO CAPS: ORAL_CAPSULE | ORAL | 3 refills | Status: DC

## 2020-08-09 ENCOUNTER — Other Ambulatory Visit: Payer: Self-pay | Admitting: Sports Medicine

## 2020-08-09 DIAGNOSIS — M19071 Primary osteoarthritis, right ankle and foot: Secondary | ICD-10-CM

## 2020-08-12 ENCOUNTER — Ambulatory Visit (INDEPENDENT_AMBULATORY_CARE_PROVIDER_SITE_OTHER): Payer: No Typology Code available for payment source | Admitting: Physical Therapy

## 2020-08-12 ENCOUNTER — Other Ambulatory Visit: Payer: Self-pay

## 2020-08-12 ENCOUNTER — Encounter: Payer: Self-pay | Admitting: Physical Therapy

## 2020-08-12 DIAGNOSIS — R293 Abnormal posture: Secondary | ICD-10-CM

## 2020-08-12 DIAGNOSIS — M542 Cervicalgia: Secondary | ICD-10-CM

## 2020-08-12 DIAGNOSIS — R29898 Other symptoms and signs involving the musculoskeletal system: Secondary | ICD-10-CM | POA: Diagnosis not present

## 2020-08-12 NOTE — Therapy (Addendum)
[place], [place], [address] Phone: [phone]   Fax:  [phone]  Physical Therapy Evaluation  Patient Details  Name: Danny Cohen MRN: [id number] Date of Birth: 04/19/1966 Referring Provider (PT): Dr. Darene Lamer    Encounter Date: 08/12/2020   PT End of Session - 08/12/20 1712    Visit Number 1    Number of Visits 12    Date for PT Re-Evaluation 09/23/20    Authorization Type A[employer]    Authorization Time Period 08/12/2020 to 09/23/2020    Authorization - Visit Number 1    Authorization - Number of Visits 12    PT Start Time 1618    PT Stop Time 1700    PT Time Calculation (min) 42 min    Activity Tolerance Patient tolerated treatment well    Behavior During Therapy [facility] for tasks assessed/performed           Past Medical History:  Diagnosis Date  . Chronic back pain greater than 3 months duration 09/03/2011  . Esophageal reflux disease 09/03/2011  . Gastric ulcer due to Helicobacter pylori 13/24/4010  . Peptic ulcer due to Helicobacter pylori 27/25/3664    Past Surgical History:  Procedure Laterality Date  . HERNIA REPAIR    . LAPAROSCOPIC INGUINAL HERNIA REPAIR  04/23/2012   bilateral    There were no vitals filed for this visit.    Subjective Assessment - 08/12/20 1621    Subjective My neck and shoulder are bothering me, it can be pretty continuous and at some point in the day I can't even move my arm. I'll get numb all the way down to my middle two fingers when it flares up. At one point it was hurting in the front and back. I've always had neck pain, neurosurgeon has told me he won't do surgery on it at this point. No issues with grip strength that I've noticed.    Pertinent History ongoing neck issues with bone spur pressing on a nerve    How long can you sit comfortably? no limits but I do have some severe issues with shoulder/neck issues laying down- has an adjustable bed he can use  if needed    How long can you stand comfortably? no limits    How long can you walk comfortably? no limits but crawling can be painful    Patient Stated Goals go back through therapy and see if we can't build up some muscle, get rid of pain    Currently in Pain? Yes    Pain Score 3     Pain Location Neck    Pain Orientation Left    Pain Descriptors / Indicators Sharp;Penetrating   sharp/piercing from neck to shoulder and numb/dull from shoulder to L fingers   Pain Type Chronic pain    Pain Radiating Towards from neck down into shoulder and into fingers    Pain Onset More than a month ago    Pain Frequency Constant    Aggravating Factors  crawling, laying down    Pain Relieving Factors heat    Effect of Pain on Daily Activities moderate-severe              [facility] PT Assessment - 08/12/20 0001      Assessment   Medical Diagnosis neck and shoulder pain     Referring Provider (PT) Dr. Darene Lamer     Onset Date/Surgical Date --   acute on chronic    Next  MD Visit Dr. Darene Lamer in December     Prior Therapy PT at this clinic       Precautions   Precautions None      Restrictions   Weight Bearing Restrictions No      Balance Screen   Has the patient fallen in the past 6 months No    Has the patient had a decrease in activity level because of a fear of falling?  No    Is the patient reluctant to leave their home because of a fear of falling?  No      Home Ecologist residence      Prior Function   Level of Independence Independent    Vocation Full time employment    Vocation Requirements pest control     Leisure travelling, vacation      Observation/Other Assessments   Observations cervical traction test positive for pain relief, cervical compression negative; scapular winging noted at rest      Sensation   Additional Comments LTT intact but not experiencing numbness at time of eval       Posture/Postural Control   Posture Comments scapular winging and  chronic postural impairments including forward head/kyphotic T spine       AROM   Right Shoulder Flexion --   WNL    Right Shoulder ABduction --   WNL    Right Shoulder Internal Rotation --   T7   Right Shoulder External Rotation --   T2    Left Shoulder Flexion 160 Degrees    Left Shoulder ABduction 130 Degrees    Left Shoulder Internal Rotation --   T8   Left Shoulder External Rotation --   T2   Cervical Flexion 60    Cervical Extension 40    Cervical - Right Side Bend 25    Cervical - Left Side Bend 25    Cervical - Right Rotation mild limitation     Cervical - Left Rotation mild limitation       Strength   Right Shoulder Flexion 5/5    Right Shoulder ABduction 5/5    Right Shoulder Internal Rotation 4+/5    Right Shoulder External Rotation 4+/5    Left Shoulder Flexion 4+/5    Left Shoulder ABduction 4+/5    Left Shoulder Internal Rotation 4/5    Left Shoulder External Rotation 4/5    Right Elbow Flexion 5/5    Right Elbow Extension 5/5    Left Elbow Flexion 4+/5    Left Elbow Extension 4+/5                      Objective measurements completed on examination: See above findings.               PT Education - 08/12/20 1711    Education Details exam findings, prognosis, POC and HEP; importance of good posture and biomechanics especially given chronic impairments and active job    Person(s) Educated Patient    Methods Explanation;Demonstration;Handout    Comprehension Verbalized understanding;Returned demonstration            PT Short Term Goals - 08/12/20 1718      PT SHORT TERM GOAL #1   Title Will be compliant with appropriate HEP    Time 3    Period Weeks    Status New    Target Date 09/02/20      PT SHORT TERM GOAL #2   Title  Pain will be no more than 5/10 at worst with a 50% reduction in numbness in order to show improvement of condition    Time 3    Period Weeks    Status New      PT SHORT TERM GOAL #3   Title Patient will  be able to maintain upright posture during all functional tasks with good biomechanics without cues in order to prevent exacerbating pain    Time 3    Period Weeks    Status New             PT Long Term Goals - 08/12/20 1719      PT LONG TERM GOAL #1   Title Will demonstrate L shoulder MMT and ROM as being equal to that of R in order to show improvement of condition    Time 6    Period Weeks    Status New    Target Date 09/23/20      PT LONG TERM GOAL #2   Title Will be able to perform functional tasks such as crawling with good activation of deep cervical musculature/ability to keep cervical spine in neutral position in order to prevent exacerbation of condition at work    Time 6    Period Weeks    Status New      PT LONG TERM GOAL #3   Title Will experience peripheral symptoms no further than L humeral head in order to show improvement of condition and QOL    Time 6    Period Weeks    Status New      PT LONG TERM GOAL #4   Title Will be compliant with long term management program including long term HEP, self cervical traction, and STM work in order to promote self care and long term management of chronic condition    Time 6    Period Weeks    Status New                  Plan - 08/12/20 1713    Clinical Impression Statement Danny Cohen arrives reporting an exacerbation of chronic neck pain; he has good and bad days, and when its bad he has numbness in his 3rd and 4th finger and constant pain that is difficult to get rid of. Examination reveals chronic postural limitations as well as weakness and limited ROM in L shoulder, also scapular winging and chronic dysfunctional movement patterns. Will benefit from skilled PT services to address ongoing pain and to design long term management program that he is able to maintain in order to control pain/symptoms over the long term.    Personal Factors and Comorbidities Past/Current Experience;Time since onset of  injury/illness/exacerbation;Behavior Pattern    Examination-Activity Limitations Bed Mobility;Reach Overhead;Bend;Sleep;Carry;Lift;Squat    Examination-Participation Restrictions Occupation;Community Activity;Yard Work    Stability/Clinical Decision Making Stable/Uncomplicated    Clinical Decision Making Moderate    Rehab Potential Good    PT Frequency 2x / week    PT Duration 6 weeks    PT Treatment/Interventions ADLs/Self Care Home Management;Cryotherapy;Electrical Stimulation;Iontophoresis 4mg /ml Dexamethasone;Moist Heat;Traction;Ultrasound;Functional mobility training;Therapeutic activities;Therapeutic exercise;Patient/family education;Manual techniques;Passive range of motion;Dry needling;Energy conservation;Taping;Spinal Manipulations;Joint Manipulations    PT Next Visit Plan review HEP, continue to enforce compliance with long term management program; work on cervical and shoulder/postural strength and biomechanics, cervical traction and modalities PRN    PT Home Exercise Plan [email or web address]    Consulted and Agree with Plan of Care Patient  Patient will benefit from skilled therapeutic intervention in order to improve the following deficits and impairments:  Decreased range of motion, Increased fascial restricitons, Increased muscle spasms, Impaired UE functional use, Impaired flexibility, Improper body mechanics, Decreased mobility, Decreased strength, Postural dysfunction  Visit Diagnosis: Cervicalgia  Weakness of left upper extremity  Abnormal posture     Problem List Patient Active Problem List   Diagnosis Date Noted  . Impingement syndrome, shoulder, left 07/30/2020  . 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 Occipital neuralgia 03/18/2020  . Phlebitis 02/01/2020  . Well adult exam 01/23/2020  . Cervical radiculopathy 12/22/2019  . Rectal bleeding 10/27/2019  . Folliculitis 88/07/314  . Hydrocele 09/26/2017  . Cervical  osteoarthritis 09/09/2014  . Hyperglycemia 08/26/2014  . Hyperkalemia 06/25/2014  . Abnormal CT scan, small bowel 04/13/2014  . Liver hemangioma 12/16/2012  . Urinary frequency 09/02/2012  . Bilateral inguinal hernia (BIH) 03/25/2012  . Esophageal reflux disease 09/03/2011  . Gastric ulcer due to Helicobacter pylori 94/58/5929  . Chronic back pain greater than 3 months duration 09/03/2011  . Neck pain, chronic 07/06/2011    Ann Lions PT, DPT, PN1   Supplemental Physical Therapist [facility]    Pager [phone] Acute Rehab Office [facility] [address] [place], [place], [address] Phone: [phone]   Fax:  [phone]  Name: Danny Cohen MRN: [id number] Date of Birth: 1966-02-11

## 2020-08-23 ENCOUNTER — Ambulatory Visit (INDEPENDENT_AMBULATORY_CARE_PROVIDER_SITE_OTHER): Payer: No Typology Code available for payment source | Admitting: Physical Therapy

## 2020-08-23 ENCOUNTER — Other Ambulatory Visit: Payer: Self-pay

## 2020-08-23 DIAGNOSIS — M542 Cervicalgia: Secondary | ICD-10-CM

## 2020-08-23 DIAGNOSIS — R293 Abnormal posture: Secondary | ICD-10-CM

## 2020-08-23 DIAGNOSIS — R29898 Other symptoms and signs involving the musculoskeletal system: Secondary | ICD-10-CM

## 2020-08-23 NOTE — Patient Instructions (Signed)
Access Code: [email or web address]: [email or web address]: 12/13/2021Prepared by: [address]  Seated Cervical Retraction - 2 x daily - 7 x weekly - 1 sets - 10 reps - 3 hold  Seated Cervical Retraction and Rotation - 2 x daily - 7 x weekly - 1 sets - 10 reps  Seated Scapular Retraction - 2 x daily - 7 x weekly - 3 sets - 10 reps - 3 hold  Seated Backward Shoulder Rolls - 2 x daily - 7 x weekly - 3 sets - 10 reps  Doorway Pec Stretch at 90 Degrees Abduction - 2 x daily - 7 x weekly - 1 sets - 2 reps - 15 sec hold  Seated Thoracic Lumbar Extension with Pectoralis Stretch - 1 x daily - 7 x weekly - 1 sets - 3 reps - 10 sec hold  Standing 'L' Stretch at Counter - 1 x daily - 7 x weekly - 1 sets - 3 reps - 10 sec hold

## 2020-08-23 NOTE — Therapy (Signed)
[place], [place], [address] Phone: [phone]   Fax:  [phone]  Physical Therapy Treatment  Patient Details  Name: Danny Cohen. C[place] MRN: [id number] Date of Birth: 1966/04/17 Referring Provider (PT): Dr. Pollyann Glen   Encounter Date: 08/23/2020   PT End of Session - 08/23/20 0849    Visit Number 2    Number of Visits 12    Date for PT Re-Evaluation 09/23/20    Authorization Type Aetna    Authorization Time Period 08/12/2020 to 09/23/2020    Authorization - Visit Number 2    Authorization - Number of Visits 12    PT Start Time 0804    PT Stop Time 0846    PT Time Calculation (min) 42 min    Activity Tolerance Patient tolerated treatment well    Behavior During Therapy [facility] for tasks assessed/performed           Past Medical History:  Diagnosis Date  . Chronic back pain greater than 3 months duration 09/03/2011  . Esophageal reflux disease 09/03/2011  . Gastric ulcer due to Helicobacter pylori 81/85/6314  . Peptic ulcer due to Helicobacter pylori 97/10/6376    Past Surgical History:  Procedure Laterality Date  . HERNIA REPAIR    . LAPAROSCOPIC INGUINAL HERNIA REPAIR  04/23/2012   bilateral    There were no vitals filed for this visit.   Subjective Assessment - 08/23/20 0809    Subjective Friday his Lt shoulder pain was worse, so Saturday he didn't do his exercises, "because it hurt too bad".  Numbness in hand continues to be intermittent.    Pertinent History ongoing neck issues with bone spur pressing on a nerve    How long can you sit comfortably? no limits but I do have some severe issues with shoulder/neck issues laying down- has an adjustable bed he can use if needed    How long can you stand comfortably? no limits    How long can you walk comfortably? no limits but crawling can be painful    Patient Stated Goals go back through therapy and see if we can't build up some  muscle, get rid of pain    Currently in Pain? Yes    Pain Score 5     Pain Location Shoulder    Pain Orientation Left    Pain Descriptors / Indicators Sore    Pain Radiating Towards from neck donw into fingers.    Pain Onset More than a month ago    Aggravating Factors  crawling, laying on L side.    Pain Relieving Factors heat              [facility] PT Assessment - 08/23/20 0001      Assessment   Medical Diagnosis neck and shoulder pain     Referring Provider (PT) Dr. Pollyann Glen    Onset Date/Surgical Date --   acute on chronic    Next MD Visit 09/09/20    Prior Therapy PT at this clinic            [facility] Adult PT Treatment/Exercise - 08/23/20 0001      Self-Care   Self-Care Other Self-Care Comments    Other Self-Care Comments  pt educated on self massage to periscapular musculature with small ball against wall; pt returned demo with cues.      Neck Exercises: Standing   Other Standing Exercises counter plank to modified downward dog x 8-10 sec, 4 reps  Neck Exercises: Seated   Neck Retraction 10 reps;3 secs    Neck Retraction Limitations tactile cues for correction    Cervical Rotation Right;Left;5 reps    Cervical Rotation Limitations with axial ext    Shoulder Rolls Backwards;5 reps    Shoulder Rolls Limitations audible clicking    Other Seated Exercise scap retraction x 3 sec x 5 reps (audible clicking)    Other Seated Exercise thoracic ext over back of chair x 5-10 sec x 4 reps      Neck Exercises: Prone   Other Prone Exercise scap retraction with axial ext x 8 reps;  W's with axial ext x 10 reps (3 sec hold)      Neck Exercises: Stretches   Other Neck Stretches 3 way doorway stretch x 15 sec x 2 reps              PT Short Term Goals - 08/12/20 1718      PT SHORT TERM GOAL #1   Title Will be compliant with appropriate HEP    Time 3    Period Weeks    Status New    Target Date 09/02/20      PT SHORT TERM GOAL #2   Title Pain will be no more  than 5/10 at worst with a 50% reduction in numbness in order to show improvement of condition    Time 3    Period Weeks    Status New      PT SHORT TERM GOAL #3   Title Patient will be able to maintain upright posture during all functional tasks with good biomechanics without cues in order to prevent exacerbating pain    Time 3    Period Weeks    Status New             PT Long Term Goals - 08/12/20 1719      PT LONG TERM GOAL #1   Title Will demonstrate L shoulder MMT and ROM as being equal to that of R in order to show improvement of condition    Time 6    Period Weeks    Status New    Target Date 09/23/20      PT LONG TERM GOAL #2   Title Will be able to perform functional tasks such as crawling with good activation of deep cervical musculature/ability to keep cervical spine in neutral position in order to prevent exacerbation of condition at work    Time 6    Period Weeks    Status New      PT LONG TERM GOAL #3   Title Will experience peripheral symptoms no further than L humeral head in order to show improvement of condition and QOL    Time 6    Period Weeks    Status New      PT LONG TERM GOAL #4   Title Will be compliant with long term management program including long term HEP, self cervical traction, and STM work in order to promote self care and long term management of chronic condition    Time 6    Period Weeks    Status New                 Plan - 08/23/20 0806    Clinical Impression Statement Pt reported increased tingling into L fingers with backward motion on UBE; resolved with forward motion. Pt has audible clicking in Lt posterior shoulder (at levator scapulae insertion) with scap retraction  and shoulder rolls.  He tolerated all exercises well and reported significant reduction in pain by end of session. Progressing towards goals.    Personal Factors and Comorbidities Past/Current Experience;Time since onset of injury/illness/exacerbation;Behavior  Pattern    Examination-Activity Limitations Bed Mobility;Reach Overhead;Bend;Sleep;Carry;Lift;Squat    Examination-Participation Restrictions Occupation;Community Activity;Yard Work    Stability/Clinical Decision Making Stable/Uncomplicated    Rehab Potential Good    PT Frequency 2x / week    PT Duration 6 weeks    PT Treatment/Interventions ADLs/Self Care Home Management;Cryotherapy;Electrical Stimulation;Iontophoresis 4mg /ml Dexamethasone;Moist Heat;Traction;Ultrasound;Functional mobility training;Therapeutic activities;Therapeutic exercise;Patient/family education;Manual techniques;Passive range of motion;Dry needling;Energy conservation;Taping;Spinal Manipulations;Joint Manipulations    PT Next Visit Plan possible DN/ manual therapy to Lt levator, rhomboid, upper trap. continue progressive postural strengthening and progress HEP as tolerated.    PT Home Exercise Plan [email or web address]    Consulted and Agree with Plan of Care Patient           Patient will benefit from skilled therapeutic intervention in order to improve the following deficits and impairments:  Decreased range of motion,Increased fascial restricitons,Increased muscle spasms,Impaired UE functional use,Impaired flexibility,Improper body mechanics,Decreased mobility,Decreased strength,Postural dysfunction  Visit Diagnosis: Cervicalgia  Weakness of left upper extremity  Abnormal posture     Problem List Patient Active Problem List   Diagnosis Date Noted  . Impingement syndrome, shoulder, left 07/30/2020  . 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 Occipital neuralgia 03/18/2020  . Phlebitis 02/01/2020  . Well adult exam 01/23/2020  . Cervical radiculopathy 12/22/2019  . Rectal bleeding 10/27/2019  . Folliculitis 91/79/1505  . Hydrocele 09/26/2017  . Cervical osteoarthritis 09/09/2014  . Hyperglycemia 08/26/2014  . Hyperkalemia 06/25/2014  . Abnormal CT scan, small bowel 04/13/2014   . Liver hemangioma 12/16/2012  . Urinary frequency 09/02/2012  . Bilateral inguinal hernia (BIH) 03/25/2012  . Esophageal reflux disease 09/03/2011  . Gastric ulcer due to Helicobacter pylori 69/79/4801  . Chronic back pain greater than 3 months duration 09/03/2011  . Neck pain, chronic 07/06/2011   Kerin Perna, PTA 08/23/20 12:52 PM  [place], [place], [address] Phone: [phone]   Fax:  [phone]  Name: Kyel Purk. Carriere MRN: [id number] Date of Birth: 03-03-1966

## 2020-08-30 ENCOUNTER — Encounter: Payer: Self-pay | Admitting: Physical Therapy

## 2020-08-30 ENCOUNTER — Ambulatory Visit (INDEPENDENT_AMBULATORY_CARE_PROVIDER_SITE_OTHER): Payer: No Typology Code available for payment source | Admitting: Physical Therapy

## 2020-08-30 ENCOUNTER — Other Ambulatory Visit: Payer: Self-pay

## 2020-08-30 DIAGNOSIS — M542 Cervicalgia: Secondary | ICD-10-CM

## 2020-08-30 DIAGNOSIS — R29898 Other symptoms and signs involving the musculoskeletal system: Secondary | ICD-10-CM | POA: Diagnosis not present

## 2020-08-30 DIAGNOSIS — R293 Abnormal posture: Secondary | ICD-10-CM

## 2020-08-30 NOTE — Therapy (Signed)
[place]  [place], [place], [address] Phone: [phone]   Fax:  [phone]  Physical Therapy Treatment  Patient Details  Name: Danny Cohen MRN: [id number] Date of Birth: 1965-11-28 Referring Provider (PT): Dr. Pollyann Glen   Encounter Date: 08/30/2020   PT End of Session - 08/30/20 0717    Visit Number 3    Number of Visits 12    Date for PT Re-Evaluation 09/23/20    Authorization Type [employer]    Authorization Time Period 08/12/2020 to 09/23/2020    Authorization - Visit Number 3    Authorization - Number of Visits 12    PT Start Time 0715    PT Stop Time 0809    PT Time Calculation (min) 54 min    Activity Tolerance Patient tolerated treatment well    Behavior During Therapy [facility] for tasks assessed/performed           Past Medical History:  Diagnosis Date  . Chronic back pain greater than 3 months duration 09/03/2011  . Esophageal reflux disease 09/03/2011  . Gastric ulcer due to Helicobacter pylori 93/26/7124  . Peptic ulcer due to Helicobacter pylori 58/05/9832    Past Surgical History:  Procedure Laterality Date  . HERNIA REPAIR    . LAPAROSCOPIC INGUINAL HERNIA REPAIR  04/23/2012   bilateral    There were no vitals filed for this visit.   Subjective Assessment - 08/30/20 0719    Subjective The exercises are really flaring it up.    Pertinent History ongoing neck issues with bone spur pressing on a nerve    How long can you sit comfortably? no limits but I do have some severe issues with shoulder/neck issues laying down- has an adjustable bed he can use if needed    How long can you stand comfortably? no limits    How long can you walk comfortably? no limits but crawling can be painful    Patient Stated Goals go back through therapy and see if we can't build up some muscle, get rid of pain    Currently in Pain? Yes    Pain Score 7     Pain Location Shoulder    Pain Orientation  Left;Anterior    Pain Descriptors / Indicators Aching;Throbbing    Pain Type Chronic pain                             [facility] Adult PT Treatment/Exercise - 08/30/20 0001      Neck Exercises: Seated   Neck Retraction 5 reps    Shoulder Rolls Backwards;5 reps    Shoulder Rolls Limitations no clicking today      Manual Therapy   Manual Therapy Soft tissue mobilization    Manual therapy comments Skilled palpation and monitoring of soft tissues during DN    Soft tissue mobilization to bil UT and cspine      Neck Exercises: Stretches   Chest Stretch 2 reps;60 seconds   at 90 deg and a140 deg   Other Neck Stretches held doorway due to increased pain sx            Trigger Point Dry Needling - 08/30/20 0001    Consent Given? Yes    Education Handout Provided Yes    Muscles Treated Head and Neck Upper trapezius;Cervical multifidi;Levator scapulae    Muscles Treated Upper Quadrant Rhomboids;Pectoralis major;Pectoralis minor    Dry Needling Comments bil  Upper Trapezius Response Twitch reponse elicited;Palpable increased muscle length    Levator Scapulae Response Twitch response elicited;Palpable increased muscle length    Cervical multifidi Response Twitch reponse elicited;Palpable increased muscle length    Pectoralis Major Response Palpable increased muscle length    Pectoralis Minor Response Palpable increased muscle length    Rhomboids Response Palpable increased muscle length                PT Education - 08/30/20 0800    Education Details DN education and aftercare    Person(s) Educated Patient    Methods Explanation    Comprehension Verbalized understanding            PT Short Term Goals - 08/12/20 1718      PT SHORT TERM GOAL #1   Title Will be compliant with appropriate HEP    Time 3    Period Weeks    Status New    Target Date 09/02/20      PT SHORT TERM GOAL #2   Title Pain will be no more than 5/10 at worst with a 50% reduction in  numbness in order to show improvement of condition    Time 3    Period Weeks    Status New      PT SHORT TERM GOAL #3   Title Patient will be able to maintain upright posture during all functional tasks with good biomechanics without cues in order to prevent exacerbating pain    Time 3    Period Weeks    Status New             PT Long Term Goals - 08/12/20 1719      PT LONG TERM GOAL #1   Title Will demonstrate L shoulder MMT and ROM as being equal to that of R in order to show improvement of condition    Time 6    Period Weeks    Status New    Target Date 09/23/20      PT LONG TERM GOAL #2   Title Will be able to perform functional tasks such as crawling with good activation of deep cervical musculature/ability to keep cervical spine in neutral position in order to prevent exacerbation of condition at work    Time 6    Period Weeks    Status New      PT LONG TERM GOAL #3   Title Will experience peripheral symptoms no further than L humeral head in order to show improvement of condition and QOL    Time 6    Period Weeks    Status New      PT LONG TERM GOAL #4   Title Will be compliant with long term management program including long term HEP, self cervical traction, and STM work in order to promote self care and long term management of chronic condition    Time 6    Period Weeks    Status New                 Plan - 08/30/20 0900    Clinical Impression Statement Patient reporting increased symptoms with doorway stretches, so advised to hold. We modified to supine on foam roller without pain or UE sx. Initial trial of DN went well with good localized twitch responses especially in bil UT.    PT Treatment/Interventions ADLs/Self Care Home Management;Cryotherapy;Electrical Stimulation;Iontophoresis 4mg /ml Dexamethasone;Moist Heat;Traction;Ultrasound;Functional mobility training;Therapeutic activities;Therapeutic exercise;Patient/family education;Manual  techniques;Passive range of motion;Dry needling;Energy conservation;Taping;Spinal Manipulations;Joint Manipulations  PT Next Visit Plan Assess DN and continue as indicated. continue progressive postural strengthening and progress HEP as tolerated.           Patient will benefit from skilled therapeutic intervention in order to improve the following deficits and impairments:  Decreased range of motion,Increased fascial restricitons,Increased muscle spasms,Impaired UE functional use,Impaired flexibility,Improper body mechanics,Decreased mobility,Decreased strength,Postural dysfunction  Visit Diagnosis: Cervicalgia  Weakness of left upper extremity  Abnormal posture     Problem List Patient Active Problem List   Diagnosis Date Noted  . Impingement syndrome, shoulder, left 07/30/2020  . 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 Occipital neuralgia 03/18/2020  . Phlebitis 02/01/2020  . Well adult exam 01/23/2020  . Cervical radiculopathy 12/22/2019  . Rectal bleeding 10/27/2019  . Folliculitis 01/75/1025  . Hydrocele 09/26/2017  . Cervical osteoarthritis 09/09/2014  . Hyperglycemia 08/26/2014  . Hyperkalemia 06/25/2014  . Abnormal CT scan, small bowel 04/13/2014  . Liver hemangioma 12/16/2012  . Urinary frequency 09/02/2012  . Bilateral inguinal hernia (BIH) 03/25/2012  . Esophageal reflux disease 09/03/2011  . Gastric ulcer due to Helicobacter pylori 85/27/7824  . Chronic back pain greater than 3 months duration 09/03/2011  . Neck pain, chronic 07/06/2011    Madelyn Flavors PT 08/30/2020, 9:04 AM  [facility] [place], [place], [address] Phone: [phone]   Fax:  [phone]  Name: Danny Cohen MRN: [id number] Date of Birth: Jan 12, 1966

## 2020-08-30 NOTE — Patient Instructions (Signed)
Trigger Point Dry Needling  . What is Trigger Point Dry Needling (DN)? o DN is a physical therapy technique used to treat muscle pain and dysfunction. Specifically, DN helps deactivate muscle trigger points (muscle knots).  o A thin filiform needle is used to penetrate the skin and stimulate the underlying trigger point. The goal is for a local twitch response (LTR) to occur and for the trigger point to relax. No medication of any kind is injected during the procedure.   . What Does Trigger Point Dry Needling Feel Like?  o The procedure feels different for each individual patient. Some patients report that they do not actually feel the needle enter the skin and overall the process is not painful. Very mild bleeding may occur. However, many patients feel a deep cramping in the muscle in which the needle was inserted. This is the local twitch response.   Marland Kitchen How Will I feel after the treatment? o Soreness is normal, and the onset of soreness may not occur for a few hours. Typically this soreness does not last longer than two days.  o Bruising is uncommon, however; ice can be used to decrease any possible bruising.  o In rare cases feeling tired or nauseous after the treatment is normal. In addition, your symptoms may get worse before they get better, this period will typically not last longer than 24 hours.   . What Can I do After My Treatment? o Increase your hydration by drinking more water for the next 24 hours. o You may place ice or heat on the areas treated that have become sore, however, do not use heat on inflamed or bruised areas. Heat often brings more relief post needling. o You can continue your regular activities, but vigorous activity is not recommended initially after the treatment for 24 hours. o DN is best combined with other physical therapy such as strengthening, stretching, and other therapies.    Precautions:  In some cases, dry needling is done over the lung field. While rare,  there is a risk of pneumothorax (punctured lung). Because of this, if you ever experience shortness of breath on exertion, difficulty taking a deep breath, chest pain or a dry cough following dry needling, you should report to an emergency room and tell them that you have been dry needled over the thorax.   Madelyn Flavors, PT 08/30/20 8:00 AM  [facility] [place] [address] [facility] [place], [place] [address]  [phone] (office) [phone] (fax)

## 2020-09-01 ENCOUNTER — Ambulatory Visit (INDEPENDENT_AMBULATORY_CARE_PROVIDER_SITE_OTHER): Payer: No Typology Code available for payment source | Admitting: Rehabilitative and Restorative Service Providers"

## 2020-09-01 ENCOUNTER — Other Ambulatory Visit: Payer: Self-pay

## 2020-09-01 DIAGNOSIS — R293 Abnormal posture: Secondary | ICD-10-CM | POA: Diagnosis not present

## 2020-09-01 DIAGNOSIS — R29898 Other symptoms and signs involving the musculoskeletal system: Secondary | ICD-10-CM

## 2020-09-01 DIAGNOSIS — M542 Cervicalgia: Secondary | ICD-10-CM

## 2020-09-01 NOTE — Patient Instructions (Signed)
Access Code: [email or web address] URL: [email or web address] Date: 09/01/2020 Prepared by: Rudell Cobb  Exercises Seated Cervical Retraction - 2 x daily - 7 x weekly - 1 sets - 10 reps - 3 hold Seated Upper Trapezius Stretch - 2 x daily - 7 x weekly - 1 sets - 10 reps Seated Cervical Retraction and Rotation - 2 x daily - 7 x weekly - 1 sets - 10 reps Seated Backward Shoulder Rolls - 2 x daily - 7 x weekly - 3 sets - 10 reps Seated Thoracic Lumbar Extension with Pectoralis Stretch - 1 x daily - 7 x weekly - 1 sets - 3 reps - 10 sec hold Standing 'L' Stretch at Counter - 1 x daily - 7 x weekly - 1 sets - 3 reps - 10 sec hold Standing Anatomical Position with Scapular Retraction and Depression at Wall - 2 x daily - 7 x weekly - 1 sets - 10 reps

## 2020-09-01 NOTE — Therapy (Signed)
[place], [place], [address] Phone: [phone]   Fax:  [phone]  Physical Therapy Treatment  Patient Details  Name: Danny Cohen MRN: [id number] Date of Birth: October 18, 1965 Referring Provider (PT): Dr. Pollyann Glen   Encounter Date: 09/01/2020   PT End of Session - 09/01/20 0751    Visit Number 4    Number of Visits 12    Date for PT Re-Evaluation 09/23/20    Authorization Type [employer]    Authorization Time Period 08/12/2020 to 09/23/2020    Authorization - Visit Number 4    Authorization - Number of Visits 12    PT Start Time 0722    PT Stop Time 0802    PT Time Calculation (min) 40 min    Activity Tolerance Patient tolerated treatment well    Behavior During Therapy [facility] for tasks assessed/performed           Past Medical History:  Diagnosis Date  . Chronic back pain greater than 3 months duration 09/03/2011  . Esophageal reflux disease 09/03/2011  . Gastric ulcer due to Helicobacter pylori [id number]  . Peptic ulcer due to Helicobacter pylori [id number]    Past Surgical History:  Procedure Laterality Date  . HERNIA REPAIR    . LAPAROSCOPIC INGUINAL HERNIA REPAIR  04/23/2012   bilateral    There were no vitals filed for this visit.   Subjective Assessment - 09/01/20 0723    Subjective The patient reports DN helped reduced pain and he has improved mobility in his L upper quadrant.  He has no numbness and muscle soreness this morning.    Pertinent History ongoing neck issues with bone spur pressing on a nerve    Patient Stated Goals go back through therapy and see if we can't build up some muscle, get rid of pain    Currently in Pain? No/denies              [facility] PT Assessment - 09/01/20 0752      Assessment   Medical Diagnosis neck and shoulder pain     Referring Provider (PT) Dr. Verl Dicker Adult PT Treatment/Exercise -  09/01/20 0752      Exercises   Exercises Neck;Shoulder      Neck Exercises: Seated   Lateral Flexion Right;Left    Lateral Flexion Limitations holding 20 seconds for stretch x 2 reps      Shoulder Exercises: Standing   External Rotation Strengthening;Both;12 reps    External Rotation Limitations standing L    Retraction Strengthening;Both;12 reps      Modalities   Modalities Traction      Traction   Type of Traction Cervical    Min (lbs) 10    Max (lbs) 15    Hold Time 60    Rest Time 20    Time 15      Manual Therapy   Manual Therapy Joint mobilization;Soft tissue mobilization    Manual therapy comments to improve mobility    Joint Mobilization L AC A>P grade II-III mobs    Soft tissue mobilization scapular mobilization and STM for parascapular musculature (teres, lats, rhomboids), and upper trap                  PT Education - 09/01/20 0727    Education Details HEP removed door frame  stretch    Person(s) Educated Patient    Methods Explanation;Demonstration;Handout    Comprehension Verbalized understanding;Returned demonstration            PT Short Term Goals - 08/12/20 1718      PT SHORT TERM GOAL #1   Title Will be compliant with appropriate HEP    Time 3    Period Weeks    Status New    Target Date 09/02/20      PT SHORT TERM GOAL #2   Title Pain will be no more than 5/10 at worst with a 50% reduction in numbness in order to show improvement of condition    Time 3    Period Weeks    Status New      PT SHORT TERM GOAL #3   Title Patient will be able to maintain upright posture during all functional tasks with good biomechanics without cues in order to prevent exacerbating pain    Time 3    Period Weeks    Status New             PT Long Term Goals - 08/12/20 1719      PT LONG TERM GOAL #1   Title Will demonstrate L shoulder MMT and ROM as being equal to that of R in order to show improvement of condition    Time 6    Period Weeks     Status New    Target Date 09/23/20      PT LONG TERM GOAL #2   Title Will be able to perform functional tasks such as crawling with good activation of deep cervical musculature/ability to keep cervical spine in neutral position in order to prevent exacerbation of condition at work    Time 6    Period Weeks    Status New      PT LONG TERM GOAL #3   Title Will experience peripheral symptoms no further than L humeral head in order to show improvement of condition and QOL    Time 6    Period Weeks    Status New      PT LONG TERM GOAL #4   Title Will be compliant with long term management program including long term HEP, self cervical traction, and STM work in order to promote self care and long term management of chronic condition    Time 6    Period Weeks    Status New                 Plan - 09/01/20 [id number]    Clinical Impression Statement The patient reports reduction in symptoms with dry needling.  PT added upper trap stretch, removed doorframe stretch and modified scap retraction ther ex for home.  Also introducted traction to lengthen and reduce radicular symtpoms.  Will modify based on patient tolerance.    PT Treatment/Interventions ADLs/Self Care Home Management;Cryotherapy;Electrical Stimulation;Iontophoresis 4mg /ml Dexamethasone;Moist Heat;Traction;Ultrasound;Functional mobility training;Therapeutic activities;Therapeutic exercise;Patient/family education;Manual techniques;Passive range of motion;Dry needling;Energy conservation;Taping;Spinal Manipulations;Joint Manipulations    PT Next Visit Plan Check response to traction; Assess DN and continue as indicated. continue progressive postural strengthening and progress HEP as tolerated.    PT Home Exercise Plan [facility]           Patient will benefit from skilled therapeutic intervention in order to improve the following deficits and impairments:  Decreased range of motion,Increased fascial restricitons,Increased muscle  spasms,Impaired UE functional use,Impaired flexibility,Improper body mechanics,Decreased mobility,Decreased strength,Postural dysfunction  Visit Diagnosis: Cervicalgia  Weakness  of left upper extremity  Abnormal posture     Problem List Patient Active Problem List   Diagnosis Date Noted  . Impingement syndrome, shoulder, left 07/30/2020  . 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 Occipital neuralgia 03/18/2020  . Phlebitis 02/01/2020  . Well adult exam 01/23/2020  . Cervical radiculopathy 12/22/2019  . Rectal bleeding 10/27/2019  . Folliculitis 56/38/7564  . Hydrocele 09/26/2017  . Cervical osteoarthritis 09/09/2014  . Hyperglycemia 08/26/2014  . Hyperkalemia 06/25/2014  . Abnormal CT scan, small bowel 04/13/2014  . Liver hemangioma 12/16/2012  . Urinary frequency 09/02/2012  . Bilateral inguinal hernia (BIH) 03/25/2012  . Esophageal reflux disease 09/03/2011  . Gastric ulcer due to Helicobacter pylori 33/29/5188  . Chronic back pain greater than 3 months duration 09/03/2011  . Neck pain, chronic 07/06/2011    Devynn Scheff, PT 09/01/2020, 8:09 AM  [facility] [place], [place], [address] Phone: [phone]   Fax:  [phone]  Name: Danny Cohen MRN: [id number] Date of Birth: 03-14-1966

## 2020-09-08 ENCOUNTER — Encounter: Payer: Self-pay | Admitting: Physical Therapy

## 2020-09-08 ENCOUNTER — Other Ambulatory Visit: Payer: Self-pay

## 2020-09-08 ENCOUNTER — Ambulatory Visit (INDEPENDENT_AMBULATORY_CARE_PROVIDER_SITE_OTHER): Payer: No Typology Code available for payment source | Admitting: Physical Therapy

## 2020-09-08 DIAGNOSIS — R29898 Other symptoms and signs involving the musculoskeletal system: Secondary | ICD-10-CM | POA: Diagnosis not present

## 2020-09-08 DIAGNOSIS — R293 Abnormal posture: Secondary | ICD-10-CM | POA: Diagnosis not present

## 2020-09-08 DIAGNOSIS — M542 Cervicalgia: Secondary | ICD-10-CM

## 2020-09-08 NOTE — Therapy (Signed)
[place], [place], [address] Phone: [phone]   Fax:  [phone]  Physical Therapy Treatment  Patient Details  Name: Danny Nocera. Cohen MRN: [id number] Date of Birth: 1966-05-16 Referring Provider (PT): Dr. Pollyann Glen   Encounter Date: 09/08/2020   PT End of Session - 09/08/20 0759    Visit Number 5    Number of Visits 12    Date for PT Re-Evaluation 09/23/20    Authorization Type [employer]    Authorization Time Period 08/12/2020 to 09/23/2020    Authorization - Visit Number 5    Authorization - Number of Visits 12    PT Start Time 0718    PT Stop Time 0805    PT Time Calculation (min) 47 min    Activity Tolerance Patient tolerated treatment well    Behavior During Therapy [facility] for tasks assessed/performed           Past Medical History:  Diagnosis Date  . Chronic back pain greater than 3 months duration 09/03/2011  . Esophageal reflux disease 09/03/2011  . Gastric ulcer due to Helicobacter pylori 01/60/1093  . Peptic ulcer due to Helicobacter pylori 23/55/7322    Past Surgical History:  Procedure Laterality Date  . HERNIA REPAIR    . LAPAROSCOPIC INGUINAL HERNIA REPAIR  04/23/2012   bilateral    There were no vitals filed for this visit.   Subjective Assessment - 09/08/20 0721    Subjective Pt reports he can see some progress with the traction and DN.  He reports he is sore today.  He saw chiropractor yesterday.  He continues to have intermittent numbness in LUE with shoulder flexion of varying degrees.    Currently in Pain? Yes    Pain Score 7     Pain Location Shoulder    Pain Orientation Left;Posterior    Pain Descriptors / Indicators Aching    Aggravating Factors  laying on Lt side, crawling    Pain Relieving Factors heat              [facility] PT Assessment - 09/08/20 0001      Assessment   Medical Diagnosis neck and shoulder pain     Referring Provider (PT) Dr.  Pollyann Glen    Next MD Visit 09/09/20      AROM   Right Shoulder Flexion 160 Degrees    Right Shoulder ABduction 155 Degrees    Right Shoulder Internal Rotation --   thumb to T8   Right Shoulder External Rotation 78 Degrees    Left Shoulder Flexion 155 Degrees    Left Shoulder ABduction 150 Degrees    Left Shoulder Internal Rotation --   thumb to T10   Left Shoulder External Rotation 70 Degrees    Cervical Flexion 55    Cervical Extension 47    Cervical - Right Side Bend 32    Cervical - Left Side Bend 38    Cervical - Right Rotation 46    Cervical - Left Rotation 52      Strength   Left Shoulder Flexion 4+/5   with discomfort in L shoulder   Left Shoulder Extension 5/5    Left Shoulder Internal Rotation 5/5    Left Shoulder External Rotation 5/5            [facility] Adult PT Treatment/Exercise - 09/08/20 0001      Neck Exercises: Standing   Other Standing Exercises L's and W's x 5 sec, x 5 reps -  back against door frame.      Neck Exercises: Seated   Neck Retraction 5 reps;5 secs    Lateral Flexion Right;Left    Lateral Flexion Limitations holding 20 seconds for stretch x 2 reps      Shoulder Exercises: Seated   Other Seated Exercises thoracic ext over back of chair x 10 sec x 2 reps      Shoulder Exercises: Standing   External Rotation Limitations standing L x 10 sec x 3 reps      Shoulder Exercises: ROM/Strengthening   UBE (Upper Arm Bike) L2: 1.5 min each direction      Shoulder Exercises: Stretch   Other Shoulder Stretches low doorway stretch with arms straight x 30 sec, 3 reps.  midlevel doorway stretch x 15 sec x 2 reps (cues to dial back stretch to comfort level)      Traction   Type of Traction Cervical    Min (lbs) 10    Max (lbs) 15    Hold Time 60    Rest Time 20    Time 15                    PT Short Term Goals - 09/08/20 0802      PT SHORT TERM GOAL #1   Title Will be compliant with appropriate HEP    Time 3    Period Weeks     Status Achieved    Target Date 09/02/20      PT SHORT TERM GOAL #2   Title Pain will be no more than 5/10 at worst with a 50% reduction in numbness in order to show improvement of condition    Time 3    Period Weeks    Status Partially Met      PT SHORT TERM GOAL #3   Title Patient will be able to maintain upright posture during all functional tasks with good biomechanics without cues in order to prevent exacerbating pain    Time 3    Period Weeks    Status Partially Met             PT Long Term Goals - 09/08/20 0752      PT LONG TERM GOAL #1   Title Will demonstrate L shoulder MMT and ROM as being equal to that of R in order to show improvement of condition    Time 6    Period Weeks    Status Partially Met      PT LONG TERM GOAL #2   Title Will be able to perform functional tasks such as crawling with good activation of deep cervical musculature/ability to keep cervical spine in neutral position in order to prevent exacerbation of condition at work    Time 6    Period Weeks    Status Partially Met      PT LONG TERM GOAL #3   Title Will experience peripheral symptoms no further than L humeral head in order to show improvement of condition and QOL    Time 6    Period Weeks    Status Partially Met      PT LONG TERM GOAL #4   Title Will be compliant with long term management program including long term HEP, self cervical traction, and STM work in order to promote self care and long term management of chronic condition    Time 6    Period Weeks    Status Partially Met  Plan - 09/08/20 0754    Clinical Impression Statement Pt demonstrates improved Lt shoulder ROM and strength, near RUE.  Pt's LUE radicular symptoms have reduced and become more intermittent in nature. He required minor cues on form with exercises from HEP.  He has partially met his goals and is making gradual progress towards remaining goals.    Rehab Potential Good    PT Frequency  2x / week    PT Duration 6 weeks    PT Treatment/Interventions ADLs/Self Care Home Management;Cryotherapy;Electrical Stimulation;Iontophoresis 35m/ml Dexamethasone;Moist Heat;Traction;Ultrasound;Functional mobility training;Therapeutic activities;Therapeutic exercise;Patient/family education;Manual techniques;Passive range of motion;Dry needling;Energy conservation;Taping;Spinal Manipulations;Joint Manipulations    PT Next Visit Plan continue DN as indicated.  continue progressive postural strengthening and progress HEP as tolerated.    PT Home Exercise Plan [email or web address]    Consulted and Agree with Plan of Care Patient           Patient will benefit from skilled therapeutic intervention in order to improve the following deficits and impairments:  Decreased range of motion,Increased fascial restricitons,Increased muscle spasms,Impaired UE functional use,Impaired flexibility,Improper body mechanics,Decreased mobility,Decreased strength,Postural dysfunction  Visit Diagnosis: Cervicalgia  Weakness of left upper extremity  Abnormal posture     Problem List Patient Active Problem List   Diagnosis Date Noted  . Impingement syndrome, shoulder, left 07/30/2020  . 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 Occipital neuralgia 03/18/2020  . Phlebitis 02/01/2020  . Well adult exam 01/23/2020  . Cervical radiculopathy 12/22/2019  . Rectal bleeding 10/27/2019  . Folliculitis 027/11/5007 . Hydrocele 09/26/2017  . Cervical osteoarthritis 09/09/2014  . Hyperglycemia 08/26/2014  . Hyperkalemia 06/25/2014  . Abnormal CT scan, small bowel 04/13/2014  . Liver hemangioma 12/16/2012  . Urinary frequency 09/02/2012  . Bilateral inguinal hernia (BIH) 03/25/2012  . Esophageal reflux disease 09/03/2011  . Gastric ulcer due to Helicobacter pylori 138/18/2993 . Chronic back pain greater than 3 months duration 09/03/2011  . Neck pain, chronic 07/06/2011   JKerin Perna PTA 09/08/20 8:03 AM   [place] [address]Phone: [phone]  Fax:  [phone] Name: Danny Sarver LVittorioMRN: [id number] of Birth: [birth year]

## 2020-09-09 ENCOUNTER — Ambulatory Visit (INDEPENDENT_AMBULATORY_CARE_PROVIDER_SITE_OTHER): Payer: No Typology Code available for payment source | Admitting: Sports Medicine

## 2020-09-09 DIAGNOSIS — M19071 Primary osteoarthritis, right ankle and foot: Secondary | ICD-10-CM | POA: Diagnosis not present

## 2020-09-09 DIAGNOSIS — M7542 Impingement syndrome of left shoulder: Secondary | ICD-10-CM | POA: Diagnosis not present

## 2020-09-09 DIAGNOSIS — M5412 Radiculopathy, cervical region: Secondary | ICD-10-CM | POA: Diagnosis not present

## 2020-09-09 MED ORDER — GABAPENTIN 600 MG PO TABS: ORAL_TABLET | ORAL | 3 refills | Status: DC

## 2020-09-09 NOTE — Assessment & Plan Note (Signed)
First MTP osteoarthritis injected at the last visit, we also switched to Nexium and Celebrex to protect his GI tract due to history of a bleeding gastric ulcer, he improved considerably and almost has no pain now. In the future if he has a recurrence we can consider repeat injection, Morton's plate, or arthroplasty/arthrodesis.

## 2020-09-09 NOTE — Assessment & Plan Note (Signed)
Persistent left shoulder impingement symptoms still present, minimal labral signs, he would like to continue physical therapy for at least another 6 weeks before we consider injection.

## 2020-09-09 NOTE — Assessment & Plan Note (Signed)
Left-sided C7 radiculitis, has seen Dr. Franky Macho in the past, he has improved considerably with physical therapy including dry needling. Currently doing gabapentin 600 mg 3 times daily, increasing to [phone]. He does prefer to continue physical therapy for now.

## 2020-09-09 NOTE — Progress Notes (Signed)
    Procedures performed today:    None.  Independent interpretation of notes and tests performed by another provider:   None.  Brief History, Exam, Impression, and Recommendations:    Arthritis of first metatarsophalangeal (MTP) joint of right foot First MTP osteoarthritis injected at the last visit, we also switched to Nexium and Celebrex to protect his GI tract due to history of a bleeding gastric ulcer, he improved considerably and almost has no pain now. In the future if he has a recurrence we can consider repeat injection, Morton's plate, or arthroplasty/arthrodesis.  Cervical radiculopathy Left-sided C7 radiculitis, has seen Dr. Franky Macho in the past, he has improved considerably with physical therapy including dry needling. Currently doing gabapentin 600 mg 3 times daily, increasing to [phone]. He does prefer to continue physical therapy for now.  Impingement syndrome, shoulder, left Persistent left shoulder impingement symptoms still present, minimal labral signs, he would like to continue physical therapy for at least another 6 weeks before we consider injection.    ___________________________________________ Ihor Austin. Benjamin Stain, M.D., ABFM., CAQSM. Primary Care and Sports Medicine [facility]  Adjunct Instructor of Family Medicine  [employer]

## 2020-09-13 ENCOUNTER — Other Ambulatory Visit: Payer: Self-pay

## 2020-09-13 ENCOUNTER — Encounter: Payer: Self-pay | Admitting: Physical Therapy

## 2020-09-13 ENCOUNTER — Ambulatory Visit (INDEPENDENT_AMBULATORY_CARE_PROVIDER_SITE_OTHER): Payer: No Typology Code available for payment source | Admitting: Physical Therapy

## 2020-09-13 DIAGNOSIS — R29898 Other symptoms and signs involving the musculoskeletal system: Secondary | ICD-10-CM

## 2020-09-13 DIAGNOSIS — M542 Cervicalgia: Secondary | ICD-10-CM

## 2020-09-13 DIAGNOSIS — R293 Abnormal posture: Secondary | ICD-10-CM

## 2020-09-13 NOTE — Therapy (Signed)
[place], [place], [address] Phone: [phone]   Fax:  [phone]  Physical Therapy Treatment  Patient Details  Name: Danny Cohen MRN: [id number] Date of Birth: 07-02-1966 Referring Provider (PT): Dr. Pollyann Glen   Encounter Date: 09/13/2020   PT End of Session - 09/13/20 0802    Visit Number 6    Number of Visits 12    Date for PT Re-Evaluation 09/23/20    Authorization Type Aetna    Authorization Time Period 08/12/2020 to 09/23/2020    PT Start Time 0802    PT Stop Time 0855    PT Time Calculation (min) 53 min    Activity Tolerance Patient tolerated treatment well    Behavior During Therapy [facility] for tasks assessed/performed           Past Medical History:  Diagnosis Date  . Chronic back pain greater than 3 months duration 09/03/2011  . Esophageal reflux disease 09/03/2011  . Gastric ulcer due to Helicobacter pylori 31/54/0086  . Peptic ulcer due to Helicobacter pylori 76/19/5093    Past Surgical History:  Procedure Laterality Date  . HERNIA REPAIR    . LAPAROSCOPIC INGUINAL HERNIA REPAIR  04/23/2012   bilateral    There were no vitals filed for this visit.   Subjective Assessment - 09/13/20 0803    Subjective Pt reports that he was very sore SAturday and Sunday, did his HEP but not sure if it helped.  Pain is worse in the AM    Patient Stated Goals go back through therapy and see if we can't build up some muscle, get rid of pain    Currently in Pain? Yes    Pain Score 7     Pain Orientation Left    Pain Descriptors / Indicators Aching;Burning    Pain Type Chronic pain    Pain Onset More than a month ago    Aggravating Factors  first thing in AM    Pain Relieving Factors meds some              [facility] PT Assessment - 09/13/20 0001      Assessment   Medical Diagnosis neck and shoulder pain  (P)     Referring Provider (PT) Dr. Pollyann Glen (P)       AROM   Cervical  - Right Rotation 72 (P)     Cervical - Left Rotation 65 (P)                          [facility] Adult PT Treatment/Exercise - 09/13/20 0001      Shoulder Exercises: ROM/Strengthening   UBE (Upper Arm Bike) L3x4' FWD /BWD alt      Modalities   Modalities Electrical Stimulation;Moist Heat      Moist Heat Therapy   Number Minutes Moist Heat 15 Minutes    Moist Heat Location Cervical      Electrical Stimulation   Electrical Stimulation Location bilat upper traps    Electrical Stimulation Action IFC    Electrical Stimulation Parameters to tolerance    Electrical Stimulation Goals Tone;Pain      Traction   Type of Traction --   held today d/t havinf DN and pt not sure it has helped.     Manual Therapy   Manual therapy comments skilled palpation and monitoring of soft tissue during DN    Soft tissue mobilization STM to bilat upper traps, levators and  scalenes      Neck Exercises: Stretches   Upper Trapezius Stretch Left;Right;20 seconds    Levator Stretch Left;Right;20 seconds    Other Neck Stretches sefl mobs cervical spine with hand at side of neck            Trigger Point Dry Needling - 09/13/20 0001    Consent Given? Yes    Education Handout Provided Previously provided    Muscles Treated Head and Neck Upper trapezius;Levator scapulae    Electrical Stimulation Performed with Dry Needling Yes    E-stim with Dry Needling Details bilat upper trap    Upper Trapezius Response Palpable increased muscle length;Twitch reponse elicited    Levator Scapulae Response Palpable increased muscle length;Twitch response elicited                  PT Short Term Goals - 09/08/20 0802      PT SHORT TERM GOAL #1   Title Will be compliant with appropriate HEP    Time 3    Period Weeks    Status Achieved    Target Date 09/02/20      PT SHORT TERM GOAL #2   Title Pain will be no more than 5/10 at worst with a 50% reduction in numbness in order to show improvement of  condition    Time 3    Period Weeks    Status Partially Met      PT SHORT TERM GOAL #3   Title Patient will be able to maintain upright posture during all functional tasks with good biomechanics without cues in order to prevent exacerbating pain    Time 3    Period Weeks    Status Partially Met             PT Long Term Goals - 09/08/20 9449      PT LONG TERM GOAL #1   Title Will demonstrate L shoulder MMT and ROM as being equal to that of R in order to show improvement of condition    Time 6    Period Weeks    Status Partially Met      PT LONG TERM GOAL #2   Title Will be able to perform functional tasks such as crawling with good activation of deep cervical musculature/ability to keep cervical spine in neutral position in order to prevent exacerbation of condition at work    Time 6    Period Weeks    Status Partially Met      PT LONG TERM GOAL #3   Title Will experience peripheral symptoms no further than L humeral head in order to show improvement of condition and QOL    Time 6    Period Weeks    Status Partially Met      PT LONG TERM GOAL #4   Title Will be compliant with long term management program including long term HEP, self cervical traction, and STM work in order to promote self care and long term management of chronic condition    Time 6    Period Weeks    Status Partially Met                 Plan - 09/13/20 0841    Clinical Impression Statement Danny Cohen reports he has not had much relief with traction, this was held today DN was performed because he felt like that helped a lot.  He had a lot of tightness through bilat upper traps.  This decreased after  treatment.  He has a home tens unit and was encouraged to start using that as well.  Cervical motion was improved even through UE symptoms have not.    Rehab Potential Good    PT Frequency 2x / week    PT Duration 6 weeks    PT Treatment/Interventions ADLs/Self Care Home Management;Cryotherapy;Electrical  Stimulation;Iontophoresis 78m/ml Dexamethasone;Moist Heat;Traction;Ultrasound;Functional mobility training;Therapeutic activities;Therapeutic exercise;Patient/family education;Manual techniques;Passive range of motion;Dry needling;Energy conservation;Taping;Spinal Manipulations;Joint Manipulations    PT Next Visit Plan continue DN as indicated.  continue progressive postural strengthening and progress HEP as tolerated.    PT Home Exercise Plan [email or web address]    Consulted and Agree with Plan of Care Patient           Patient will benefit from skilled therapeutic intervention in order to improve the following deficits and impairments:  Decreased range of motion,Increased fascial restricitons,Increased muscle spasms,Impaired UE functional use,Impaired flexibility,Improper body mechanics,Decreased mobility,Decreased strength,Postural dysfunction  Visit Diagnosis: Cervicalgia  Weakness of left upper extremity  Abnormal posture     Problem List Patient Active Problem List   Diagnosis Date Noted  . Impingement syndrome, shoulder, left 07/30/2020  . 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 Occipital neuralgia 03/18/2020  . Phlebitis 02/01/2020  . Well adult exam 01/23/2020  . Cervical radiculopathy 12/22/2019  . Rectal bleeding 10/27/2019  . Folliculitis 061/95/0932 . Hydrocele 09/26/2017  . Hyperglycemia 08/26/2014  . Hyperkalemia 06/25/2014  . Abnormal CT scan, small bowel 04/13/2014  . Liver hemangioma 12/16/2012  . Urinary frequency 09/02/2012  . Bilateral inguinal hernia (BIH) 03/25/2012  . Esophageal reflux disease 09/03/2011  . Gastric ulcer due to Helicobacter pylori 167/08/4579 . Chronic back pain greater than 3 months duration 09/03/2011  . Neck pain, chronic 07/06/2011    SJeral PinchPT  09/13/2020, 8:43 AM  [facility] [address]Phone:  [phone]  Fax:  [phone] Name: Danny Geiler LTerminiMRN: [id number] of Birth: 4November 21, 1967

## 2020-09-16 ENCOUNTER — Ambulatory Visit (INDEPENDENT_AMBULATORY_CARE_PROVIDER_SITE_OTHER): Payer: No Typology Code available for payment source | Admitting: Physical Therapy

## 2020-09-16 ENCOUNTER — Encounter: Payer: Self-pay | Admitting: Physical Therapy

## 2020-09-16 ENCOUNTER — Other Ambulatory Visit: Payer: Self-pay

## 2020-09-16 DIAGNOSIS — R293 Abnormal posture: Secondary | ICD-10-CM | POA: Diagnosis not present

## 2020-09-16 DIAGNOSIS — M542 Cervicalgia: Secondary | ICD-10-CM | POA: Diagnosis not present

## 2020-09-16 DIAGNOSIS — R29898 Other symptoms and signs involving the musculoskeletal system: Secondary | ICD-10-CM

## 2020-09-16 NOTE — Therapy (Signed)
[place], [place], [address] Phone: [phone]   Fax:  [phone]  Physical Therapy Treatment  Patient Details  Name: Danny Cohen. Wager MRN: [id number] Date of Birth: 04-01-1966 Referring Provider (PT): Dr. Pollyann Glen   Encounter Date: 09/16/2020   PT End of Session - 09/16/20 0801    Visit Number 7    Number of Visits 12    Date for PT Re-Evaluation 09/23/20    Authorization Type [employer]    Authorization Time Period 08/12/2020 to 09/23/2020    Authorization - Visit Number 7    Authorization - Number of Visits 12    PT Start Time 0801    PT Stop Time 0839    PT Time Calculation (min) 38 min    Activity Tolerance Patient limited by pain    Behavior During Therapy [facility] for tasks assessed/performed           Past Medical History:  Diagnosis Date  . Chronic back pain greater than 3 months duration 09/03/2011  . Esophageal reflux disease 09/03/2011  . Gastric ulcer due to Helicobacter pylori 77/82/4235  . Peptic ulcer due to Helicobacter pylori 36/14/4315    Past Surgical History:  Procedure Laterality Date  . HERNIA REPAIR    . LAPAROSCOPIC INGUINAL HERNIA REPAIR  04/23/2012   bilateral    There were no vitals filed for this visit.   Subjective Assessment - 09/16/20 0802    Subjective Pt reports that yesterday morning he was 10/10 pain in the neck - not sure why - he was in a crawl space the day before.  The pain was pulsing and  shooting up the neck,  Today it is a little better.    Patient Stated Goals go back through therapy and see if we can't build up some muscle, get rid of pain    Currently in Pain? Yes    Pain Score 6     Pain Location Neck    Pain Orientation Left    Pain Descriptors / Indicators Aching;Spasm;Shooting    Pain Type Chronic pain    Pain Frequency Constant    Aggravating Factors  had to get in  cramped space for work and thinks that maybe this caused it    Pain  Relieving Factors ibuprofen two times yesterday to take the edge off                             [facility] Adult PT Treatment/Exercise - 09/16/20 0001      Shoulder Exercises: ROM/Strengthening   UBE (Upper Arm Bike) L3x4' FWD /BWD alt      Manual Therapy   Manual Therapy Joint mobilization;Passive ROM    Joint Mobilization grade III CPA and bilat UPA mobs cervical through T 9, hypomobility throughout T spine improved with mobs, pt was reactive with CPA mobs at T4 sending nerve pain to the Rt scapula - this decreased as well after tx    Passive ROM PROM cervical spine in all directions with slight overpress on transverse process. manual traction and modified occipital release                    PT Short Term Goals - 09/08/20 0802      PT SHORT TERM GOAL #1   Title Will be compliant with appropriate HEP    Time 3    Period Weeks    Status Achieved  Target Date 09/02/20      PT SHORT TERM GOAL #2   Title Pain will be no more than 5/10 at worst with a 50% reduction in numbness in order to show improvement of condition    Time 3    Period Weeks    Status Partially Met      PT SHORT TERM GOAL #3   Title Patient will be able to maintain upright posture during all functional tasks with good biomechanics without cues in order to prevent exacerbating pain    Time 3    Period Weeks    Status Partially Met             PT Long Term Goals - 09/08/20 0752      PT LONG TERM GOAL #1   Title Will demonstrate L shoulder MMT and ROM as being equal to that of R in order to show improvement of condition    Time 6    Period Weeks    Status Partially Met      PT LONG TERM GOAL #2   Title Will be able to perform functional tasks such as crawling with good activation of deep cervical musculature/ability to keep cervical spine in neutral position in order to prevent exacerbation of condition at work    Time 6    Period Weeks    Status Partially Met      PT LONG  TERM GOAL #3   Title Will experience peripheral symptoms no further than L humeral head in order to show improvement of condition and QOL    Time 6    Period Weeks    Status Partially Met      PT LONG TERM GOAL #4   Title Will be compliant with long term management program including long term HEP, self cervical traction, and STM work in order to promote self care and long term management of chronic condition    Time 6    Period Weeks    Status Partially Met                 Plan - 09/16/20 0841    Clinical Impression Statement Hakeem has a lot of hypomobility through the thoracic spine and some in the cervical.  Responded very well to manual work to improve this and decrease his symptoms.  He would benefit from more of this.    Rehab Potential Good    PT Frequency 2x / week    PT Duration 6 weeks    PT Treatment/Interventions ADLs/Self Care Home Management;Cryotherapy;Electrical Stimulation;Iontophoresis 31m/ml Dexamethasone;Moist Heat;Traction;Ultrasound;Functional mobility training;Therapeutic activities;Therapeutic exercise;Patient/family education;Manual techniques;Passive range of motion;Dry needling;Energy conservation;Taping;Spinal Manipulations;Joint Manipulations    PT Next Visit Plan spinal mobs cervical and thoracic, possible DN to occipital muscles.    PT Home Exercise Plan [email or web address]    Consulted and Agree with Plan of Care Patient           Patient will benefit from skilled therapeutic intervention in order to improve the following deficits and impairments:  Decreased range of motion,Increased fascial restricitons,Increased muscle spasms,Impaired UE functional use,Impaired flexibility,Improper body mechanics,Decreased mobility,Decreased strength,Postural dysfunction  Visit Diagnosis: Cervicalgia  Weakness of left upper extremity  Abnormal posture     Problem List Patient Active Problem List   Diagnosis Date Noted  . Impingement syndrome, shoulder, left  07/30/2020  . 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 Occipital neuralgia 03/18/2020  . Phlebitis 02/01/2020  . Well adult exam 01/23/2020  .  Cervical radiculopathy 12/22/2019  . Rectal bleeding 10/27/2019  . Folliculitis 29/79/8921  . Hydrocele 09/26/2017  . Hyperglycemia 08/26/2014  . Hyperkalemia 06/25/2014  . Abnormal CT scan, small bowel 04/13/2014  . Liver hemangioma 12/16/2012  . Urinary frequency 09/02/2012  . Bilateral inguinal hernia (BIH) 03/25/2012  . Esophageal reflux disease 09/03/2011  . Gastric ulcer due to Helicobacter pylori 19/41/7408  . Chronic back pain greater than 3 months duration 09/03/2011  . Neck pain, chronic 07/06/2011    Jeral Pinch PT  09/16/2020, 8:43 AM  [facility] [place], [place], [address] Phone: [phone]   Fax:  [phone]  Name: Jt Brabec. Velis MRN: [id number] Date of Birth: 08-Apr-1966

## 2020-09-20 ENCOUNTER — Ambulatory Visit (INDEPENDENT_AMBULATORY_CARE_PROVIDER_SITE_OTHER): Payer: No Typology Code available for payment source | Admitting: Physical Therapy

## 2020-09-20 ENCOUNTER — Encounter: Payer: Self-pay | Admitting: Physical Therapy

## 2020-09-20 ENCOUNTER — Other Ambulatory Visit: Payer: Self-pay

## 2020-09-20 DIAGNOSIS — M542 Cervicalgia: Secondary | ICD-10-CM | POA: Diagnosis not present

## 2020-09-20 DIAGNOSIS — R293 Abnormal posture: Secondary | ICD-10-CM

## 2020-09-20 DIAGNOSIS — R29898 Other symptoms and signs involving the musculoskeletal system: Secondary | ICD-10-CM

## 2020-09-20 NOTE — Therapy (Signed)
[place], [place], [address] Phone: [phone]   Fax:  [phone]  Physical Therapy Treatment  Patient Details  Name: Danny Cohen. Adams MRN: [id number] Date of Birth: [birth year] Referring Provider (PT): Dr. Pollyann Glen   Encounter Date: 09/20/2020   PT End of Session - 09/20/20 1118    Visit Number 8    Number of Visits 12    Date for PT Re-Evaluation 09/23/20    Authorization Type [employer]et[employer]    Authorization Time Period 08/12/2020 to 09/23/2020    Authorization - Visit Number 8    Authorization - Number of Visits 12    PT Start Time 1116   in late   PT Stop Time 1144    PT Time Calculation (min) 28 min    Activity Tolerance Patient tolerated treatment well    Behavior During Therapy [facility] for tasks assessed/performed           Past Medical History:  Diagnosis Date  . Chronic back pain greater than 3 months duration 09/03/2011  . Esophageal reflux disease 09/03/2011  . Gastric ulcer due to Helicobacter pylori 22/10/5425  . Peptic ulcer due to Helicobacter pylori 03/04/7627    Past Surgical History:  Procedure Laterality Date  . HERNIA REPAIR    . LAPAROSCOPIC INGUINAL HERNIA REPAIR  04/23/2012   bilateral    There were no vitals filed for this visit.   Subjective Assessment - 09/20/20 1116    Subjective Pt reports he is feeling better today, his shoulders aren't hurting .  Feels some throbbing in the upper neck.    Patient Stated Goals go back through therapy and see if we can't build up some muscle, get rid of pain    Currently in Pain? No/denies    Pain Score 4     Pain Location Neck    Pain Orientation Upper    Pain Descriptors / Indicators Throbbing    Pain Type Chronic pain    Pain Relieving Factors using self trigger point release                             [facility] Adult PT Treatment/Exercise - 09/20/20 0001      Neck Exercises: Supine   Capital Flexion 10  reps   deep neck flexor exercise   Shoulder Flexion 15 reps;Both   green band overhead pull     Shoulder Exercises: ROM/Strengthening   UBE (Upper Arm Bike) L4x4' FWD /BWD alt      Manual Therapy   Manual Therapy Soft tissue mobilization;Myofascial release    Manual therapy comments skilled palpation and monitoring of soft tissue during DN    Soft tissue mobilization STM to perioccipital muscles in prone    Myofascial Release occipital base release in supine - able to complete this today d/t decreased tightness after DN            Trigger Point Dry Needling - 09/20/20 0001    Consent Given? Yes    Education Handout Provided Previously provided    Muscles Treated Head and Neck Oblique capitus;Suboccipitals;Splenius capitus    Oblique Capitus Response Palpable increased muscle length;Twitch response elicited    Suboccipitals Response Palpable increased muscle length;Twitch response elicited    Splenius capitus Response Palpable increased muscle length;Twitch reponse elicited    Cervical multifidi Response Palpable increased muscle length  PT Short Term Goals - 09/08/20 0802      PT SHORT TERM GOAL #1   Title Will be compliant with appropriate HEP    Time 3    Period Weeks    Status Achieved    Target Date 09/02/20      PT SHORT TERM GOAL #2   Title Pain will be no more than 5/10 at worst with a 50% reduction in numbness in order to show improvement of condition    Time 3    Period Weeks    Status Partially Met      PT SHORT TERM GOAL #3   Title Patient will be able to maintain upright posture during all functional tasks with good biomechanics without cues in order to prevent exacerbating pain    Time 3    Period Weeks    Status Partially Met             PT Long Term Goals - 09/08/20 0752      PT LONG TERM GOAL #1   Title Will demonstrate L shoulder MMT and ROM as being equal to that of R in order to show improvement of condition    Time 6     Period Weeks    Status Partially Met      PT LONG TERM GOAL #2   Title Will be able to perform functional tasks such as crawling with good activation of deep cervical musculature/ability to keep cervical spine in neutral position in order to prevent exacerbation of condition at work    Time 6    Period Weeks    Status Partially Met      PT LONG TERM GOAL #3   Title Will experience peripheral symptoms no further than L humeral head in order to show improvement of condition and QOL    Time 6    Period Weeks    Status Partially Met      PT LONG TERM GOAL #4   Title Will be compliant with long term management program including long term HEP, self cervical traction, and STM work in order to promote self care and long term management of chronic condition    Time 6    Period Weeks    Status Partially Met                 Plan - 09/20/20 1147    Clinical Impression Statement Elic was running late today so treatment was shortened.  He reports having a really good weekend and only having throbbing at the base of the head upper cervical area today.  Responded well to DN with palpable decrease in tightness, was able to complete occipital base release today as the muscles responded well.    Rehab Potential Good    PT Frequency 2x / week    PT Duration 6 weeks    PT Treatment/Interventions ADLs/Self Care Home Management;Cryotherapy;Electrical Stimulation;Iontophoresis 4mg /ml Dexamethasone;Moist Heat;Traction;Ultrasound;Functional mobility training;Therapeutic activities;Therapeutic exercise;Patient/family education;Manual techniques;Passive range of motion;Dry needling;Energy conservation;Taping;Spinal Manipulations;Joint Manipulations    PT Next Visit Plan spinal mobs cervical and thoracic, possible DN to occipital muscles.    PT Home Exercise Plan [email or web address]    Consulted and Agree with Plan of Care Patient           Patient will benefit from skilled therapeutic intervention in order  to improve the following deficits and impairments:  Decreased range of motion,Increased fascial restricitons,Increased muscle spasms,Impaired UE functional use,Impaired flexibility,Improper body mechanics,Decreased mobility,Decreased strength,Postural dysfunction  Visit  Diagnosis: Cervicalgia  Weakness of left upper extremity  Abnormal posture     Problem List Patient Active Problem List   Diagnosis Date Noted  . Impingement syndrome, shoulder, left 07/30/2020  . 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 Occipital neuralgia 03/18/2020  . Phlebitis 02/01/2020  . Well adult exam 01/23/2020  . Cervical radiculopathy 12/22/2019  . Rectal bleeding 10/27/2019  . Folliculitis 87/18/3672  . Hydrocele 09/26/2017  . Hyperglycemia 08/26/2014  . Hyperkalemia 06/25/2014  . Abnormal CT scan, small bowel 04/13/2014  . Liver hemangioma 12/16/2012  . Urinary frequency 09/02/2012  . Bilateral inguinal hernia (BIH) 03/25/2012  . Esophageal reflux disease 09/03/2011  . Gastric ulcer due to Helicobacter pylori 55/00/1642  . Chronic back pain greater than 3 months duration 09/03/2011  . Neck pain, chronic 07/06/2011    Jeral Pinch PT  09/20/2020, 11:49 AM  [facility] [place], [place], [address] Phone: [phone]   Fax:  [phone]  Name: Suyash Amory. [place] MRN: [id number] Date of Birth: [birth year]

## 2020-09-22 ENCOUNTER — Other Ambulatory Visit: Payer: Self-pay | Admitting: Family Medicine

## 2020-09-23 ENCOUNTER — Ambulatory Visit (INDEPENDENT_AMBULATORY_CARE_PROVIDER_SITE_OTHER): Payer: No Typology Code available for payment source | Admitting: Physical Therapy

## 2020-09-23 ENCOUNTER — Encounter: Payer: Self-pay | Admitting: Physical Therapy

## 2020-09-23 ENCOUNTER — Other Ambulatory Visit: Payer: Self-pay

## 2020-09-23 DIAGNOSIS — M542 Cervicalgia: Secondary | ICD-10-CM

## 2020-09-23 DIAGNOSIS — R293 Abnormal posture: Secondary | ICD-10-CM | POA: Diagnosis not present

## 2020-09-23 DIAGNOSIS — R29898 Other symptoms and signs involving the musculoskeletal system: Secondary | ICD-10-CM | POA: Diagnosis not present

## 2020-09-23 NOTE — Therapy (Signed)
[place], [place], [address] Phone: [phone]   Fax:  [phone]  Physical Therapy Treatment  Patient Details  Name: Danny Cohen MRN: [id number] Date of Birth: 1966-08-01 Referring Provider (PT): Dr. Pollyann Glen   Encounter Date: 09/23/2020   PT End of Session - 09/23/20 0847    Visit Number 9    Number of Visits 17    Date for PT Re-Evaluation 10/21/20    Authorization Type Aetna    PT Start Time 0848    PT Stop Time 0942    PT Time Calculation (min) 54 min    Activity Tolerance Patient tolerated treatment well    Behavior During Therapy [facility] for tasks assessed/performed           Past Medical History:  Diagnosis Date  . Chronic back pain greater than 3 months duration 09/03/2011  . Esophageal reflux disease 09/03/2011  . Gastric ulcer due to Helicobacter pylori 97/10/6376  . Peptic ulcer due to Helicobacter pylori 58/85/0277    Past Surgical History:  Procedure Laterality Date  . HERNIA REPAIR    . LAPAROSCOPIC INGUINAL HERNIA REPAIR  04/23/2012   bilateral    There were no vitals filed for this visit.   Subjective Assessment - 09/23/20 0848    Subjective Danny Cohen reports that he woke up sore this morning, did his HEP and placed heat on it and it feels better now.  He was sore after the treatment    Patient Stated Goals go back through therapy and see if we can't build up some muscle, get rid of pain    Currently in Pain? Yes    Pain Score 2    was 5/10 this morning   Pain Location Neck    Pain Orientation Left;Mid    Pain Descriptors / Indicators Dull    Pain Type Chronic pain    Pain Onset More than a month ago              [facility] PT Assessment - 09/23/20 0001      Assessment   Medical Diagnosis neck and shoulder pain     Referring Provider (PT) Dr. Pollyann Glen      AROM   Right Shoulder Flexion 168 Degrees    Right Shoulder ABduction 160 Degrees    Right  Shoulder External Rotation 95 Degrees    Left Shoulder Flexion 165 Degrees    Left Shoulder ABduction 157 Degrees    Left Shoulder External Rotation 85 Degrees    Cervical Flexion 40    Cervical Extension 47   tender at base of neck   Cervical - Right Side Bend 30    Cervical - Left Side Bend 28    Cervical - Right Rotation 82    Cervical - Left Rotation 76      Strength   Overall Strength Comments mid back 4/5    Right/Left Shoulder --   Rt 5/5   Left Shoulder Flexion 5/5    Left Shoulder Extension 5/5    Left Shoulder ABduction 4+/5    Left Shoulder Internal Rotation 5/5    Left Shoulder External Rotation --   5-/5                        [facility] Adult PT Treatment/Exercise - 09/23/20 0001      Shoulder Exercises: Prone   Retraction Strengthening;Both;10 reps   palms down and thumbs up  Shoulder Exercises: ROM/Strengthening   UBE (Upper Arm Bike) L4x4' FWD /BWD alt      Modalities   Modalities Electrical Stimulation;Moist Heat      Moist Heat Therapy   Number Minutes Moist Heat 10 Minutes    Moist Heat Location Cervical   and upper shoulders     Electrical Stimulation   Electrical Stimulation Location bilat upper traps    Electrical Stimulation Action IFC    Electrical Stimulation Parameters to tolerance    Electrical Stimulation Goals Tone;Pain      Manual Therapy   Manual therapy comments skilled palpation and monitoring of soft tissue during DN    Soft tissue mobilization STm to bilat upper traps and peri occipital            Trigger Point Dry Needling - 09/23/20 0001    Consent Given? Yes    Education Handout Provided Previously provided    Electrical Stimulation Performed with Dry Needling Yes    Upper Trapezius Response Palpable increased muscle length;Twitch reponse elicited   bilat with stim                 PT Short Term Goals - 09/23/20 0851      PT SHORT TERM GOAL #1   Title Will be compliant with appropriate HEP     Status Achieved      PT SHORT TERM GOAL #2   Title Pain will be no more than 5/10 at worst with a 50% reduction in numbness in order to show improvement of condition    Baseline pain intermittent, worse has been 5/10, numbness decreased 20%    Time 4    Status Partially Met    Target Date 10/21/20      PT SHORT TERM GOAL #3   Title Patient will be able to maintain upright posture during all functional tasks with good biomechanics without cues in order to prevent exacerbating pain    Status Achieved             PT Long Term Goals - 09/23/20 0853      PT LONG TERM GOAL #1   Title Will demonstrate L shoulder MMT and ROM as being equal to that of R in order to show improvement of condition    Time 4    Period Weeks    Status On-going    Target Date 10/21/20      PT LONG TERM GOAL #2   Title Will be able to perform functional tasks such as crawling with good activation of deep cervical musculature/ability to keep cervical spine in neutral position in order to prevent exacerbation of condition at work    Baseline will occassionally have increased pain with crawling and looking up when under a house    Time 4    Period Weeks    Status Partially Met    Target Date 10/21/20      PT LONG TERM GOAL #3   Title Will experience peripheral symptoms no further than L humeral head in order to show improvement of condition and QOL    Status Achieved      PT LONG TERM GOAL #4   Title Will be compliant with long term management program including long term HEP, self cervical traction, and STM work in order to promote self care and long term management of chronic condition    Time 4    Status On-going    Target Date 10/21/20      PT  LONG TERM GOAL #5   Title Improved FOTO score <34%    Baseline FOTO not in the system    Status Unable to assess                 Plan - 09/23/20 0907    Clinical Impression Statement Danny Cohen reports that PT is really helping him with less pain and  restoring his ROM.  His pain is now intermittent, it does move around from the base of the occiput to the upper shoulders.  He hold alot of tightness in his shoulders.  He responds well to manual work.  He does have weakness in his upper back and hypomobility through the thoracic spine which could be placing stress on the cervical region.  He would benefit from continued tx to address the tighness and improve strength.    Rehab Potential Good    PT Frequency 2x / week    PT Duration 4 weeks    PT Treatment/Interventions ADLs/Self Care Home Management;Cryotherapy;Electrical Stimulation;Iontophoresis 66m/ml Dexamethasone;Moist Heat;Traction;Ultrasound;Functional mobility training;Therapeutic activities;Therapeutic exercise;Patient/family education;Manual techniques;Passive range of motion;Dry needling;Energy conservation;Taping;Spinal Manipulations;Joint Manipulations    PT Next Visit Plan spinal mobs cervical and thoracic, possible DN to occipital muscles.    PT Home Exercise Plan [email or web address]    Consulted and Agree with Plan of Care Patient           Patient will benefit from skilled therapeutic intervention in order to improve the following deficits and impairments:  Decreased range of motion,Increased fascial restricitons,Increased muscle spasms,Impaired UE functional use,Impaired flexibility,Improper body mechanics,Decreased mobility,Decreased strength,Postural dysfunction  Visit Diagnosis: Cervicalgia - Plan: PT plan of care cert/re-cert  Weakness of left upper extremity - Plan: PT plan of care cert/re-cert  Abnormal posture - Plan: PT plan of care cert/re-cert     Problem List Patient Active Problem List   Diagnosis Date Noted  . Impingement syndrome, shoulder, left 07/30/2020  . 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 Occipital neuralgia 03/18/2020  . Phlebitis 02/01/2020  . Well adult exam 01/23/2020  . Cervical radiculopathy 12/22/2019  .  Rectal bleeding 10/27/2019  . Folliculitis 046/56/8127 . Hydrocele 09/26/2017  . Hyperglycemia 08/26/2014  . Hyperkalemia 06/25/2014  . Abnormal CT scan, small bowel 04/13/2014  . Liver hemangioma 12/16/2012  . Urinary frequency 09/02/2012  . Bilateral inguinal hernia (BIH) 03/25/2012  . Esophageal reflux disease 09/03/2011  . Gastric ulcer due to Helicobacter pylori 151/70/0174 . Chronic back pain greater than 3 months duration 09/03/2011  . Neck pain, chronic 07/06/2011    SJeral PinchPT  09/23/2020, 1:04 PM  [facility] [address]Phone: [phone]  Fax:  [phone] Name: Danny Azure LEarwoodMRN: [id number] of Birth: [birth year]

## 2020-09-24 ENCOUNTER — Other Ambulatory Visit: Payer: Self-pay | Admitting: Sports Medicine

## 2020-09-24 DIAGNOSIS — M19071 Primary osteoarthritis, right ankle and foot: Secondary | ICD-10-CM

## 2020-09-27 ENCOUNTER — Encounter: Payer: No Typology Code available for payment source | Admitting: Physical Therapy

## 2020-09-30 ENCOUNTER — Other Ambulatory Visit: Payer: Self-pay

## 2020-09-30 ENCOUNTER — Ambulatory Visit (INDEPENDENT_AMBULATORY_CARE_PROVIDER_SITE_OTHER): Payer: No Typology Code available for payment source | Admitting: Physical Therapy

## 2020-09-30 DIAGNOSIS — R293 Abnormal posture: Secondary | ICD-10-CM

## 2020-09-30 DIAGNOSIS — M542 Cervicalgia: Secondary | ICD-10-CM

## 2020-09-30 DIAGNOSIS — R29898 Other symptoms and signs involving the musculoskeletal system: Secondary | ICD-10-CM | POA: Diagnosis not present

## 2020-09-30 NOTE — Therapy (Signed)
[place], [place], [address] Phone: [phone]   Fax:  [phone]  Physical Therapy Treatment  Patient Details  Name: Danny Cohen MRN: [id number] Date of Birth: 1965-12-06 Referring Provider (PT): Dr. Pollyann Glen   Encounter Date: 09/30/2020   PT End of Session - 09/30/20 0838    Visit Number 10    Number of Visits 17    Date for PT Re-Evaluation 10/21/20    Authorization Type [employer]    Authorization - Visit Number 10    Authorization - Number of Visits 12    PT Start Time 0800    PT Stop Time 7371    PT Time Calculation (min) 47 min    Activity Tolerance Patient tolerated treatment well    Behavior During Therapy [facility] for tasks assessed/performed           Past Medical History:  Diagnosis Date  . Chronic back pain greater than 3 months duration 09/03/2011  . Esophageal reflux disease 09/03/2011  . Gastric ulcer due to Helicobacter pylori 03/06/9484  . Peptic ulcer due to Helicobacter pylori 46/27/0350    Past Surgical History:  Procedure Laterality Date  . HERNIA REPAIR    . LAPAROSCOPIC INGUINAL HERNIA REPAIR  04/23/2012   bilateral    There were no vitals filed for this visit.   Subjective Assessment - 09/30/20 0803    Subjective Pt reports he has had increased pain in Lt shoulder since last session. He did the prone exercise on Saturday and "may have overdone it". He states he felt a pop in the front of his shoulder and has had more pain and numbness in his Lt arm. Has only done neck stretches since Sunday.    Patient Stated Goals go back through therapy and see if we can't build up some muscle, get rid of pain    Currently in Pain? Yes    Pain Score 7     Pain Location Neck    Pain Orientation Left    Pain Descriptors / Indicators Aching;Shooting    Pain Radiating Towards into Lt shoulder and down to fingers    Aggravating Factors  prone ex with arms in Y position     Pain Relieving Factors heat              [facility] PT Assessment - 09/30/20 0001      Assessment   Medical Diagnosis neck and shoulder pain     Referring Provider (PT) Dr. 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;3 reps    Star Gazer Stretch 4 reps;20 seconds    Other Shoulder Stretches arms in T/Y with lower trunk rotation x 10 sec, 5 reps      Traction   Min (lbs) 10    Max (lbs) 15    Hold Time 60    Rest Time 20    Time 15  PT Education - 09/30/20 [phone]    Education Details Posture education, HEP - (from [facility])    Person(s) Educated Patient    Methods Explanation;Demonstration;Tactile cues;Verbal cues;Handout    Comprehension Returned demonstration;Verbalized understanding            PT Short Term Goals - 09/23/20 0851      PT SHORT TERM GOAL #1   Title Will be compliant with appropriate HEP    Status Achieved      PT SHORT TERM GOAL #2   Title Pain will be no more than 5/10 at worst with a 50% reduction in numbness in order to show improvement of condition    Baseline pain intermittent, worse has been 5/10, numbness decreased 20%    Time 4    Status Partially Met    Target Date 10/21/20      PT SHORT TERM GOAL #3   Title Patient will be able to maintain upright posture during all functional tasks with good biomechanics without cues in order to prevent exacerbating pain    Status Achieved             PT Long Term Goals - 09/23/20 0853      PT LONG TERM GOAL #1   Title Will demonstrate L shoulder MMT and ROM as being equal  to that of R in order to show improvement of condition    Time 4    Period Weeks    Status On-going    Target Date 10/21/20      PT LONG TERM GOAL #2   Title Will be able to perform functional tasks such as crawling with good activation of deep cervical musculature/ability to keep cervical spine in neutral position in order to prevent exacerbation of condition at work    Baseline will occassionally have increased pain with crawling and looking up when under a house    Time 4    Period Weeks    Status Partially Met    Target Date 10/21/20      PT LONG TERM GOAL #3   Title Will experience peripheral symptoms no further than L humeral head in order to show improvement of condition and QOL    Status Achieved      PT LONG TERM GOAL #4   Title Will be compliant with long term management program including long term HEP, self cervical traction, and STM work in order to promote self care and long term management of chronic condition    Time 4    Status On-going    Target Date 10/21/20      PT LONG TERM GOAL #5   Title Improved FOTO score <34%    Baseline FOTO not in the system    Status Unable to assess                 Plan - 09/30/20 9163    Clinical Impression Statement Pt arrived with elevated pain level and radicular symptoms down arm into hand.  Pt reported decreased pain and symptoms during exercises in the decompression position.  Pt reported no pain or radicular symptoms at end of session.  Encouraged pt to be more mindful of returning head to neutral position instead of forward to assist with less pain.   Also encouraged pt to utilize his TENS unit and decompression position if pain elevates again.  Goals are ongoing due to recent flare up.    Rehab Potential Good    PT Frequency 2x / week  PT Duration 4 weeks    PT Treatment/Interventions ADLs/Self Care Home Management;Cryotherapy;Electrical Stimulation;Iontophoresis 15m/ml Dexamethasone;Moist  Heat;Traction;Ultrasound;Functional mobility training;Therapeutic activities;Therapeutic exercise;Patient/family education;Manual techniques;Passive range of motion;Dry needling;Energy conservation;Taping;Spinal Manipulations;Joint Manipulations    PT Next Visit Plan spinal mobs cervical and thoracic, possible DN to occipital muscles.  review HEP and modify as needed.    PT Home Exercise Plan [email or web address]    Consulted and Agree with Plan of Care Patient           Patient will benefit from skilled therapeutic intervention in order to improve the following deficits and impairments:  Decreased range of motion,Increased fascial restricitons,Increased muscle spasms,Impaired UE functional use,Impaired flexibility,Improper body mechanics,Decreased mobility,Decreased strength,Postural dysfunction  Visit Diagnosis: Cervicalgia  Weakness of left upper extremity  Abnormal posture     Problem List Patient Active Problem List   Diagnosis Date Noted  . Impingement syndrome, shoulder, left 07/30/2020  . 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 Occipital neuralgia 03/18/2020  . Phlebitis 02/01/2020  . Well adult exam 01/23/2020  . Cervical radiculopathy 12/22/2019  . Rectal bleeding 10/27/2019  . Folliculitis 053/64/6803 . Hydrocele 09/26/2017  . Hyperglycemia 08/26/2014  . Hyperkalemia 06/25/2014  . Abnormal CT scan, small bowel 04/13/2014  . Liver hemangioma 12/16/2012  . Urinary frequency 09/02/2012  . Bilateral inguinal hernia (BIH) 03/25/2012  . Esophageal reflux disease 09/03/2011  . Gastric ulcer due to Helicobacter pylori 121/22/4825 . Chronic back pain greater than 3 months duration 09/03/2011  . Neck pain, chronic 07/06/2011   JKerin Perna PTA 09/30/20 9:28 AM  [place] [address]Phone: [phone]  Fax:  [phone] Name: Danny Cohen  LBaldwinMRN: [id number] of Birth: 408/15/1967

## 2020-09-30 NOTE — Patient Instructions (Signed)
Decompression Exercise: Basic    Lie on back on firm surface, knees bent, feet flat, arms turned up, out to sides, backs of hands down. Time _5-10__ minutes. Surface: floor   Elbow Press    Interlace fingers; bring hands underneath head. Press elbows down. Hold _20__ seconds. Relax arms. Repeat _3_ times.  Thoracic Lift    Press shoulders down. Then lift mid-thoracic spine (area between the shoulder blades). Lift the breastbone slightly. Hold _5__ seconds. Relax. Repeat __5_ times.  Head Press With Dodge chin SLIGHTLY toward chest, keep mouth closed. Feel weight on back of head. Increase weight by pressing head down. Hold _5__ seconds. Relax. Repeat _5__ times. (squeeze but don't smush the grape under head) Surface: floor     Flexibility: Corner Stretch    Standing in corner with hands just above shoulder level,  STEP ONE FOOT forward until a comfortable stretch is felt across chest. Hold _20___ seconds. Repeat _2-3___ times per set.  Do _1-2___ sessions per day.

## 2020-10-04 ENCOUNTER — Other Ambulatory Visit: Payer: Self-pay

## 2020-10-04 ENCOUNTER — Encounter: Payer: Self-pay | Admitting: Physical Therapy

## 2020-10-04 ENCOUNTER — Ambulatory Visit (INDEPENDENT_AMBULATORY_CARE_PROVIDER_SITE_OTHER): Payer: No Typology Code available for payment source | Admitting: Physical Therapy

## 2020-10-04 DIAGNOSIS — M542 Cervicalgia: Secondary | ICD-10-CM | POA: Diagnosis not present

## 2020-10-04 DIAGNOSIS — R29898 Other symptoms and signs involving the musculoskeletal system: Secondary | ICD-10-CM

## 2020-10-04 DIAGNOSIS — R293 Abnormal posture: Secondary | ICD-10-CM

## 2020-10-04 NOTE — Patient Instructions (Signed)
Access Code: [email or web address] URL: [email or web address] Date: 10/04/2020 Prepared by: Almyra Free  Exercises Seated Cervical Retraction - 1 sets - 5-10 reps Seated Upper Trapezius Stretch - 1 sets Seated Cervical Retraction and Rotation - 1 sets Seated Backward Shoulder Rolls - 10 reps Seated Thoracic Lumbar Extension with Pectoralis Stretch - 1 sets - 10 sec hold Prone Scapular Retraction Arms at Side - 1 x daily - 1-3 sets - 10 reps Prone Shoulder Horizontal Abduction with Thumbs Up - 1 x daily - 1-3 sets - 10 reps Prone Trunk Lift with Shoulder Horizontal Abduction - 1 x daily - 1-3 sets - 10 reps Sidelying Thoracic Rotation with Open Book - 1 x daily - 7 x weekly - 2 sets - 5 reps Prone on Elbows Reach - 1 x daily - 7 x weekly - 3 sets - 10 reps Prone Alternating Arm and Leg Lifts - 1 x daily - 7 x weekly - 10 reps - 3 sets - 5 sec hold

## 2020-10-04 NOTE — Therapy (Signed)
[place], [place], [address] Phone: [phone]   Fax:  [phone]  Physical Therapy Treatment  Patient Details  Name: Danny Cohen MRN: [id number] Date of Birth: 1966/09/02 Referring Provider (PT): Dr. Pollyann Glen   Encounter Date: 10/04/2020   PT End of Session - 10/04/20 0935    Visit Number 11    Number of Visits 17    Date for PT Re-Evaluation 10/21/20    Authorization Type Aetna    Authorization Time Period 08/12/2020 to 09/23/2020    Authorization - Visit Number 11    Authorization - Number of Visits 17    PT Start Time 0936    PT Stop Time 1020    PT Time Calculation (min) 44 min    Activity Tolerance Patient tolerated treatment well    Behavior During Therapy [facility] for tasks assessed/performed           Past Medical History:  Diagnosis Date  . Chronic back pain greater than 3 months duration 09/03/2011  . Esophageal reflux disease 09/03/2011  . Gastric ulcer due to Helicobacter pylori 58/05/9832  . Peptic ulcer due to Helicobacter pylori 82/50/5397    Past Surgical History:  Procedure Laterality Date  . HERNIA REPAIR    . LAPAROSCOPIC INGUINAL HERNIA REPAIR  04/23/2012   bilateral    There were no vitals filed for this visit.   Subjective Assessment - 10/04/20 0939    Subjective Patient reporting no pain today. Much better than last visit.    Pertinent History ongoing neck issues with bone spur pressing on a nerve    How long can you sit comfortably? no limits but I do have some severe issues with shoulder/neck issues laying down- has an adjustable bed he can use if needed    How long can you stand comfortably? no limits    Patient Stated Goals go back through therapy and see if we can't build up some muscle, get rid of pain    Currently in Pain? No/denies                             [facility] Adult PT Treatment/Exercise - 10/04/20 0001      Shoulder  Exercises: Prone   Other Prone Exercises POE with shoulder reach and holding neck retraction; cues to avoid rolling hips x 5 bil    Other Prone Exercises pelvic press 5 x 5 sec; goal post and opp leg lift x 5 ea      Shoulder Exercises: Sidelying   Other Sidelying Exercises open book x 5 bil; cues for deep breaths      Shoulder Exercises: ROM/Strengthening   UBE (Upper Arm Bike) L3 1.5 min fwd/bwd      Manual Therapy   Manual Therapy Joint mobilization;Soft tissue mobilization    Manual therapy comments skilled palpation and monitoring of soft tissue during DN    Joint Mobilization grade III thoracic mobs mid thoracic    Soft tissue mobilization to bil UT and cspine      Neck Exercises: Stretches   Chest Stretch 2 reps;60 seconds   at 90 deg           Trigger Point Dry Needling - 10/04/20 0001    Consent Given? Yes    Education Handout Provided Previously provided    Muscles Treated Head and Neck Upper trapezius;Cervical multifidi;Levator scapulae    Upper Trapezius Response Palpable increased  muscle length    Levator Scapulae Response Twitch response elicited;Palpable increased muscle length    Cervical multifidi Response Twitch reponse elicited;Palpable increased muscle length                PT Education - 10/04/20 1021    Education Details HEP    Person(s) Educated Patient    Methods Demonstration;Explanation;Handout    Comprehension Verbalized understanding;Returned demonstration            PT Short Term Goals - 09/23/20 0851      PT SHORT TERM GOAL #1   Title Will be compliant with appropriate HEP    Status Achieved      PT SHORT TERM GOAL #2   Title Pain will be no more than 5/10 at worst with a 50% reduction in numbness in order to show improvement of condition    Baseline pain intermittent, worse has been 5/10, numbness decreased 20%    Time 4    Status Partially Met    Target Date 10/21/20      PT SHORT TERM GOAL #3   Title Patient will be able  to maintain upright posture during all functional tasks with good biomechanics without cues in order to prevent exacerbating pain    Status Achieved             PT Long Term Goals - 09/23/20 0853      PT LONG TERM GOAL #1   Title Will demonstrate L shoulder MMT and ROM as being equal to that of R in order to show improvement of condition    Time 4    Period Weeks    Status On-going    Target Date 10/21/20      PT LONG TERM GOAL #2   Title Will be able to perform functional tasks such as crawling with good activation of deep cervical musculature/ability to keep cervical spine in neutral position in order to prevent exacerbation of condition at work    Baseline will occassionally have increased pain with crawling and looking up when under a house    Time 4    Period Weeks    Status Partially Met    Target Date 10/21/20      PT LONG TERM GOAL #3   Title Will experience peripheral symptoms no further than L humeral head in order to show improvement of condition and QOL    Status Achieved      PT LONG TERM GOAL #4   Title Will be compliant with long term management program including long term HEP, self cervical traction, and STM work in order to promote self care and long term management of chronic condition    Time 4    Status On-going    Target Date 10/21/20      PT LONG TERM GOAL #5   Title Improved FOTO score <34%    Baseline FOTO not in the system    Status Unable to assess                 Plan - 10/04/20 1348    Clinical Impression Statement Patient with decreased pain today compared to last visit. He still reports tension and discomfort in the left UT/shoulder with ADLS. He had flared up doing the superman exercise with OH reach, so we worked on single arm reaches in prone on elbows position. He did fairly well with this but demonstrated difficulty stabilizing his mid and lower spine. He is still stiff in mid  thoracic spine with PA mobs. Open book was issued which  improved with repetitions. Good response to DN today Rt> Lt.    PT Treatment/Interventions ADLs/Self Care Home Management;Cryotherapy;Electrical Stimulation;Iontophoresis 74m/ml Dexamethasone;Moist Heat;Traction;Ultrasound;Functional mobility training;Therapeutic activities;Therapeutic exercise;Patient/family education;Manual techniques;Passive range of motion;Dry needling;Energy conservation;Taping;Spinal Manipulations;Joint Manipulations    PT Next Visit Plan continue spinal mobs cervical and thoracic, assess DN and new HEP.    PT [facility]          Patient will benefit from skilled therapeutic intervention in order to improve the following deficits and impairments:  Decreased range of motion,Increased fascial restricitons,Increased muscle spasms,Impaired UE functional use,Impaired flexibility,Improper body mechanics,Decreased mobility,Decreased strength,Postural dysfunction  Visit Diagnosis: Cervicalgia  Weakness of left upper extremity  Abnormal posture     Problem List Patient Active Problem List   Diagnosis Date Noted  . Impingement syndrome, shoulder, left 07/30/2020  . 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 Occipital neuralgia 03/18/2020  . Phlebitis 02/01/2020  . Well adult exam 01/23/2020  . Cervical radiculopathy 12/22/2019  . Rectal bleeding 10/27/2019  . Folliculitis 001/60/1093 . Hydrocele 09/26/2017  . Hyperglycemia 08/26/2014  . Hyperkalemia 06/25/2014  . Abnormal CT scan, small bowel 04/13/2014  . Liver hemangioma 12/16/2012  . Urinary frequency 09/02/2012  . Bilateral inguinal hernia (BIH) 03/25/2012  . Esophageal reflux disease 09/03/2011  . Gastric ulcer due to Helicobacter pylori 123/55/7322 . Chronic back pain greater than 3 months duration 09/03/2011  . Neck pain, chronic 07/06/2011    JMadelyn FlavorsPT 10/04/2020, 1:56 PM  [facility] [place] [address] [address]Phone: [phone]  Fax:  [phone] Name: Danny Deal LRichertMRN: [id number] of Birth: [birth year]

## 2020-10-07 ENCOUNTER — Encounter: Payer: Self-pay | Admitting: Physical Therapy

## 2020-10-07 ENCOUNTER — Ambulatory Visit (INDEPENDENT_AMBULATORY_CARE_PROVIDER_SITE_OTHER): Payer: No Typology Code available for payment source | Admitting: Physical Therapy

## 2020-10-07 DIAGNOSIS — R293 Abnormal posture: Secondary | ICD-10-CM

## 2020-10-07 DIAGNOSIS — M542 Cervicalgia: Secondary | ICD-10-CM

## 2020-10-07 DIAGNOSIS — R29898 Other symptoms and signs involving the musculoskeletal system: Secondary | ICD-10-CM | POA: Diagnosis not present

## 2020-10-07 NOTE — Therapy (Signed)
[place], [place], [address] Phone: [phone]   Fax:  [phone]  Physical Therapy Treatment  Patient Details  Name: Danny Cohen. [place] MRN: [id number] Date of Birth: 1966/05/22 Referring Provider (PT): Dr. Pollyann Glen   Encounter Date: 10/07/2020   PT End of Session - 10/07/20 0805    Visit Number 12    Number of Visits 17    Date for PT Re-Evaluation 10/21/20    PT Start Time 0805    PT Stop Time 0845    PT Time Calculation (min) 40 min    Activity Tolerance Patient tolerated treatment well    Behavior During Therapy [facility] for tasks assessed/performed           Past Medical History:  Diagnosis Date  . Chronic back pain greater than 3 months duration 09/03/2011  . Esophageal reflux disease 09/03/2011  . Gastric ulcer due to Helicobacter pylori 34/91/7915  . Peptic ulcer due to Helicobacter pylori 05/69/7948    Past Surgical History:  Procedure Laterality Date  . HERNIA REPAIR    . LAPAROSCOPIC INGUINAL HERNIA REPAIR  04/23/2012   bilateral    There were no vitals filed for this visit.   Subjective Assessment - 10/07/20 0805    Subjective Danny Cohen reports he was sore after the DN, did his ther ex and used some ibuprofen.  Rt side of neck very sore this AM, thinks he slept funny. Feels he is 30-40% improvement    Patient Stated Goals go back through therapy and see if we can't build up some muscle, get rid of pain    Currently in Pain? Yes    Pain Score 4     Pain Location Neck    Pain Orientation Right    Pain Descriptors / Indicators Aching;Sore    Pain Type Chronic pain    Pain Onset More than a month ago                             [facility] Adult PT Treatment/Exercise - 10/07/20 0001      Self-Care   Other Self-Care Comments  foam roll bilat lats      Shoulder Exercises: Supine   Other Supine Exercises lying over half bolster, overhead reaches,bolster lower  scapula and then mid scapula      Shoulder Exercises: Sidelying   Other Sidelying Exercises open book x 5 bil; cues for deep breaths, progressed to moving around the clock      Shoulder Exercises: ROM/Strengthening   UBE (Upper Arm Bike) L3 1.5 min fwd/bwd      Manual Therapy   Manual Therapy Joint mobilization;Soft tissue mobilization    Joint Mobilization grade III thoracic and cervical mobs, CPA and bilat UPA    Soft tissue mobilization to bil UT and cspine                    PT Short Term Goals - 09/23/20 0851      PT SHORT TERM GOAL #1   Title Will be compliant with appropriate HEP    Status Achieved      PT SHORT TERM GOAL #2   Title Pain will be no more than 5/10 at worst with a 50% reduction in numbness in order to show improvement of condition    Baseline pain intermittent, worse has been 5/10, numbness decreased 20%    Time 4    Status  Partially Met    Target Date 10/21/20      PT SHORT TERM GOAL #3   Title Patient will be able to maintain upright posture during all functional tasks with good biomechanics without cues in order to prevent exacerbating pain    Status Achieved             PT Long Term Goals - 09/23/20 0853      PT LONG TERM GOAL #1   Title Will demonstrate L shoulder MMT and ROM as being equal to that of R in order to show improvement of condition    Time 4    Period Weeks    Status On-going    Target Date 10/21/20      PT LONG TERM GOAL #2   Title Will be able to perform functional tasks such as crawling with good activation of deep cervical musculature/ability to keep cervical spine in neutral position in order to prevent exacerbation of condition at work    Baseline will occassionally have increased pain with crawling and looking up when under a house    Time 4    Period Weeks    Status Partially Met    Target Date 10/21/20      PT LONG TERM GOAL #3   Title Will experience peripheral symptoms no further than L humeral head in  order to show improvement of condition and QOL    Status Achieved      PT LONG TERM GOAL #4   Title Will be compliant with long term management program including long term HEP, self cervical traction, and STM work in order to promote self care and long term management of chronic condition    Time 4    Status On-going    Target Date 10/21/20      PT LONG TERM GOAL #5   Title Improved FOTO score <34%    Baseline FOTO not in the system    Status Unable to assess                 Plan - 10/07/20 0922    Clinical Impression Danny Cohen continues to make small improvements.  He does have more spinal mobility as compared to 3-4 wks ago,  TIghtness in upper traps continue however not as bad.    Rehab Potential Good    PT Frequency 2x / week    PT Duration 4 weeks    PT Treatment/Interventions ADLs/Self Care Home Management;Cryotherapy;Electrical Stimulation;Iontophoresis 28m/ml Dexamethasone;Moist Heat;Traction;Ultrasound;Functional mobility training;Therapeutic activities;Therapeutic exercise;Patient/family education;Manual techniques;Passive range of motion;Dry needling;Energy conservation;Taping;Spinal Manipulations;Joint Manipulations    PT Next Visit Plan continue spinal mobs cervical and thoracic, assess DN and new HEP.    PT Home Exercise Plan [email or web address]    Consulted and Agree with Plan of Care Patient           Patient will benefit from skilled therapeutic intervention in order to improve the following deficits and impairments:  Decreased range of motion,Increased fascial restricitons,Increased muscle spasms,Impaired UE functional use,Impaired flexibility,Improper body mechanics,Decreased mobility,Decreased strength,Postural dysfunction  Visit Diagnosis: Cervicalgia  Weakness of left upper extremity  Abnormal posture     Problem List Patient Active Problem List   Diagnosis Date Noted  . Impingement syndrome, shoulder, left 07/30/2020  . Arthritis of first  metatarsophalangeal (MTP) joint of right foot 07/02/2020  . Occipital neuralgia 03/18/2020  . Phlebitis 02/01/2020  . Well adult exam 01/23/2020  . Cervical radiculopathy 12/22/2019  . Rectal bleeding 10/27/2019  . Folliculitis 083/38/2505 .  Hydrocele 09/26/2017  . Hyperglycemia 08/26/2014  . Hyperkalemia 06/25/2014  . Abnormal CT scan, small bowel 04/13/2014  . Liver hemangioma 12/16/2012  . Urinary frequency 09/02/2012  . Bilateral inguinal hernia (BIH) 03/25/2012  . Esophageal reflux disease 09/03/2011  . Gastric ulcer due to Helicobacter pylori 03/49/6116  . Chronic back pain greater than 3 months duration 09/03/2011  . Neck pain, chronic 07/06/2011    Manuela Schwartz Tracye Szuch PT  10/07/2020, 9:24 AM  [facility] [place], [place], [address] Phone: [phone]   Fax:  [phone]  Name: Danny Cohen. Danny Cohen MRN: [id number] Date of Birth: 06/27/1966

## 2020-10-11 ENCOUNTER — Ambulatory Visit (INDEPENDENT_AMBULATORY_CARE_PROVIDER_SITE_OTHER): Payer: No Typology Code available for payment source | Admitting: Physical Therapy

## 2020-10-11 ENCOUNTER — Other Ambulatory Visit: Payer: Self-pay

## 2020-10-11 ENCOUNTER — Encounter: Payer: Self-pay | Admitting: Physical Therapy

## 2020-10-11 DIAGNOSIS — R293 Abnormal posture: Secondary | ICD-10-CM | POA: Diagnosis not present

## 2020-10-11 DIAGNOSIS — R29898 Other symptoms and signs involving the musculoskeletal system: Secondary | ICD-10-CM

## 2020-10-11 DIAGNOSIS — M542 Cervicalgia: Secondary | ICD-10-CM

## 2020-10-11 NOTE — Therapy (Signed)
[place], [place], [address] Phone: [phone]   Fax:  [phone]  Physical Therapy Treatment  Patient Details  Name: Danny Cohen MRN: [id number] Date of Birth: 07/21/1966 Referring Provider (PT): Dr. Pollyann Glen   Encounter Date: 10/11/2020   PT End of Session - 10/11/20 0821    Visit Number 13    Number of Visits 17    Date for PT Re-Evaluation 10/21/20    Authorization Type [employer]et[employer]    PT Start Time 0803    PT Stop Time 0846    PT Time Calculation (min) 43 min    Activity Tolerance Patient tolerated treatment well    Behavior During Therapy [facility] for tasks assessed/performed           Past Medical History:  Diagnosis Date  . Chronic back pain greater than 3 months duration 09/03/2011  . Esophageal reflux disease 09/03/2011  . Gastric ulcer due to Helicobacter pylori 98/33/8250  . Peptic ulcer due to Helicobacter pylori 53/97/6734    Past Surgical History:  Procedure Laterality Date  . HERNIA REPAIR    . LAPAROSCOPIC INGUINAL HERNIA REPAIR  04/23/2012   bilateral    There were no vitals filed for this visit.   Subjective Assessment - 10/11/20 0807    Subjective Pt reports he has some soreness in his Rt neck and Lt pec.  He thinks the exercise (open book) has been a good stretch.    Patient Stated Goals go back through therapy and see if we can't build up some muscle, get rid of pain    Currently in Pain? Yes    Pain Score 6     Pain Location Shoulder   pec   Pain Orientation Right    Pain Descriptors / Indicators Sore    Pain Radiating Towards some numbness in shoulder and tricep area.    Aggravating Factors  crawling for work.    Pain Relieving Factors heat              [facility] PT Assessment - 10/11/20 0001      Assessment   Medical Diagnosis neck and shoulder pain     Referring Provider (PT) Dr. Pollyann Glen    Onset Date/Surgical Date --   acute on chronic    Next  MD Visit 10/21/20    Prior Therapy PT at this clinic           [facility] Adult PT Treatment/Exercise - 10/11/20 0001      Self-Care   Other Self-Care Comments  reviewed self massage with ball to lats, teres major area to decrease stiffness/ improve motion. Pt returned demo with cues.      Neck Exercises: Supine   Other Supine Exercise reviewed head press/ scap squeeze x 5 reps      Lumbar Exercises: Quadruped   Other Quadruped Lumbar Exercises quadruped reaching arm towards ceiling x 5 reps each side.    Other Quadruped Lumbar Exercises childs pose with/without lateral trunk flexion      Shoulder Exercises: Seated   Other Seated Exercises reviewed seated cervical lateral flexion, thoracic ext over back of chair, chin tucks.      Shoulder Exercises: Prone   Other Prone Exercises POE with shoulder reach and holding neck retraction; cues to avoid rolling hips x 5 bil.  cervical flex to/from neutral, and cervical diagonals for ROM/stretches.    Other Prone Exercises opp arm/leg lift x 10 each side, with thumb up.  Shoulder Exercises: Sidelying   Other Sidelying Exercises open book x 5 bil with red band; then moving around the clock with UE (open book) x 5 reps, no band.      Shoulder Exercises: ROM/Strengthening   UBE (Upper Arm Bike) L3 1.5 min fwd/bwd      Manual Therapy   Manual therapy comments i strips of reg Rock tape in X pattern over Lt AC joint to decompress tissue and increase proprioception.    Soft tissue mobilization STM/ MFR to Lt pec, lat, subscap, teres major                    PT Short Term Goals - 09/23/20 0851      PT SHORT TERM GOAL #1   Title Will be compliant with appropriate HEP    Status Achieved      PT SHORT TERM GOAL #2   Title Pain will be no more than 5/10 at worst with a 50% reduction in numbness in order to show improvement of condition    Baseline pain intermittent, worse has been 5/10, numbness decreased 20%    Time 4    Status  Partially Met    Target Date 10/21/20      PT SHORT TERM GOAL #3   Title Patient will be able to maintain upright posture during all functional tasks with good biomechanics without cues in order to prevent exacerbating pain    Status Achieved             PT Long Term Goals - 09/23/20 0853      PT LONG TERM GOAL #1   Title Will demonstrate L shoulder MMT and ROM as being equal to that of R in order to show improvement of condition    Time 4    Period Weeks    Status On-going    Target Date 10/21/20      PT LONG TERM GOAL #2   Title Will be able to perform functional tasks such as crawling with good activation of deep cervical musculature/ability to keep cervical spine in neutral position in order to prevent exacerbation of condition at work    Baseline will occassionally have increased pain with crawling and looking up when under a house    Time 4    Period Weeks    Status Partially Met    Target Date 10/21/20      PT LONG TERM GOAL #3   Title Will experience peripheral symptoms no further than L humeral head in order to show improvement of condition and QOL    Status Achieved      PT LONG TERM GOAL #4   Title Will be compliant with long term management program including long term HEP, self cervical traction, and STM work in order to promote self care and long term management of chronic condition    Time 4    Status On-going    Target Date 10/21/20      PT LONG TERM GOAL #5   Title Improved FOTO score <34%    Baseline FOTO not in the system    Status Unable to assess                 Plan - 10/11/20 3662    Clinical Impression Statement Reviewed current HEP and eliminated exercises that were aggrivating his Lt AC joint.   He tolerated all exercises well and reported slight reduction in soreness in chest afterwards.  He is observed  to have limited Lt shoulder ROM/ thoracic rotation with quadruped position Lt hand reaching towards ceiling.  He may benefit from  DN/manual therapy to pec and lat in future session.  Continued emphasis on postural awareness during his workday to avoid irritation to his neck. Progressng gradually towards remaining goals.    Rehab Potential Good    PT Frequency 2x / week    PT Duration 4 weeks    PT Treatment/Interventions ADLs/Self Care Home Management;Cryotherapy;Electrical Stimulation;Iontophoresis 63m/ml Dexamethasone;Moist Heat;Traction;Ultrasound;Functional mobility training;Therapeutic activities;Therapeutic exercise;Patient/family education;Manual techniques;Passive range of motion;Dry needling;Energy conservation;Taping;Spinal Manipulations;Joint Manipulations    PT Next Visit Plan manual/DN to Lt upper quarter. assess goals. postural strengthening.    PT Home Exercise Plan [email or web address]    Consulted and Agree with Plan of Care Patient           Patient will benefit from skilled therapeutic intervention in order to improve the following deficits and impairments:  Decreased range of motion,Increased fascial restricitons,Increased muscle spasms,Impaired UE functional use,Impaired flexibility,Improper body mechanics,Decreased mobility,Decreased strength,Postural dysfunction  Visit Diagnosis: Cervicalgia  Weakness of left upper extremity  Abnormal posture     Problem List Patient Active Problem List   Diagnosis Date Noted  . Impingement syndrome, shoulder, left 07/30/2020  . 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 Occipital neuralgia 03/18/2020  . Phlebitis 02/01/2020  . Well adult exam 01/23/2020  . Cervical radiculopathy 12/22/2019  . Rectal bleeding 10/27/2019  . Folliculitis 037/35/7897 . Hydrocele 09/26/2017  . Hyperglycemia 08/26/2014  . Hyperkalemia 06/25/2014  . Abnormal CT scan, small bowel 04/13/2014  . Liver hemangioma 12/16/2012  . Urinary frequency 09/02/2012  . Bilateral inguinal hernia (BIH) 03/25/2012  . Esophageal reflux disease 09/03/2011  .  Gastric ulcer due to Helicobacter pylori 184/78/4128 . Chronic back pain greater than 3 months duration 09/03/2011  . Neck pain, chronic 07/06/2011   JKerin Perna PTA 10/11/20 12:30 PM  [place] [address]Phone: [phone]  Fax:  [phone] Name: Danny Scullion LReinigMRN: [id number] of Birth: 408-Sep-1967

## 2020-10-11 NOTE — Patient Instructions (Signed)
Access Code: [email or web address] URL: [email or web address] Date: 10/11/2020 Prepared by: [place]  Exercises Seated Cervical Retraction - 1 sets - 5-10 reps Seated Upper Trapezius Stretch - 1 sets Seated Cervical Retraction and Rotation - 1 sets Seated Thoracic Lumbar Extension with Pectoralis Stretch - 1 sets - 10 sec hold Sidelying Thoracic Rotation with Open Book - 1 x daily - 7 x weekly - 2 sets - 5 reps Prone on Elbows Reach - 1 x daily - 7 x weekly - 1 sets - 10 reps Prone Alternating Arm and Leg Lifts - 1 x daily - 7 x weekly - 10 reps - 1 sets - 5 sec hold Child's Pose with Sidebending - 1 x daily - 7 x weekly - 1 sets - 2 reps - 15 seconds hold

## 2020-10-14 ENCOUNTER — Other Ambulatory Visit: Payer: Self-pay

## 2020-10-14 ENCOUNTER — Encounter: Payer: Self-pay | Admitting: Physical Therapy

## 2020-10-14 ENCOUNTER — Ambulatory Visit (INDEPENDENT_AMBULATORY_CARE_PROVIDER_SITE_OTHER): Payer: No Typology Code available for payment source | Admitting: Physical Therapy

## 2020-10-14 DIAGNOSIS — R29898 Other symptoms and signs involving the musculoskeletal system: Secondary | ICD-10-CM

## 2020-10-14 DIAGNOSIS — R293 Abnormal posture: Secondary | ICD-10-CM

## 2020-10-14 DIAGNOSIS — M542 Cervicalgia: Secondary | ICD-10-CM

## 2020-10-14 NOTE — Patient Instructions (Signed)
Access Code: [email or web address] URL: [email or web address] Date: 10/14/2020 Prepared by: Almyra Free  Exercises Seated Cervical Retraction - 1 sets - 5-10 reps Seated Upper Trapezius Stretch - 1 sets Seated Cervical Retraction and Rotation - 1 sets Seated Thoracic Lumbar Extension with Pectoralis Stretch - 1 sets - 10 sec hold Sidelying Thoracic Rotation with Open Book - 1 x daily - 7 x weekly - 2 sets - 5 reps Prone on Elbows Reach - 1 x daily - 7 x weekly - 1 sets - 10 reps Prone Alternating Arm and Leg Lifts - 1 x daily - 7 x weekly - 10 reps - 1 sets - 5 sec hold Child's Pose with Sidebending - 1 x daily - 7 x weekly - 1 sets - 2 reps - 15 seconds hold Seated Cervical Traction - 1 sets - 3 reps - 20 sec hold

## 2020-10-14 NOTE — Therapy (Signed)
[place], [place], [address] Phone: [phone]   Fax:  [phone]  Physical Therapy Treatment  Patient Details  Name: Danny Cohen. Danny Cohen MRN: [id number] Date of Birth: 1965/09/18 Referring Provider (PT): Dr. Pollyann Glen   Encounter Date: 10/14/2020   PT End of Session - 10/14/20 0804    Visit Number 14    Number of Visits 17    Date for PT Re-Evaluation 10/21/20    Authorization Type Aetna    Authorization Time Period 08/12/2020 to 09/23/2020    Authorization - Visit Number 14    Authorization - Number of Visits 17    PT Start Time 0800    PT Stop Time 0854    PT Time Calculation (min) 54 min    Activity Tolerance Patient tolerated treatment well    Behavior During Therapy [facility] for tasks assessed/performed           Past Medical History:  Diagnosis Date  . Chronic back pain greater than 3 months duration 09/03/2011  . Esophageal reflux disease 09/03/2011  . Gastric ulcer due to Helicobacter pylori 51/76/1607  . Peptic ulcer due to Helicobacter pylori 37/06/6268    Past Surgical History:  Procedure Laterality Date  . HERNIA REPAIR    . LAPAROSCOPIC INGUINAL HERNIA REPAIR  04/23/2012   bilateral    There were no vitals filed for this visit.   Subjective Assessment - 10/14/20 0805    Subjective Patient reporting increased pain since Monday afternoon. He had to work in two crawl spaces which may have aggravated it.    Pertinent History ongoing neck issues with bone spur pressing on a nerve    How long can you sit comfortably? no limits but I do have some severe issues with shoulder/neck issues laying down- has an adjustable bed he can use if needed    Currently in Pain? Yes    Pain Score 8     Pain Location Shoulder    Pain Orientation Right                             [facility] Adult PT Treatment/Exercise - 10/14/20 0001      Neck Exercises: Seated   Other Seated  Exercise self traction using towel 3x 20 sec; then with strap x 2      Shoulder Exercises: Prone   Other Prone Exercises POE with shoulder reach and holding neck retraction x 5 bil.  .      Shoulder Exercises: Sidelying   Other Sidelying Exercises UE (open book) x 5 reps, no band.then moving around the clock with UE (open book) x 5 reps right shoulder: Left open book x 10 with extended LE for greater leverage; prolonged stretch at end.      Shoulder Exercises: ROM/Strengthening   UBE (Upper Arm Bike) L4 x 4 min      Manual Therapy   Manual Therapy Soft tissue mobilization    Manual therapy comments skilled palpation and monitoring of soft tissue during DN    Soft tissue mobilization to left cervical            Trigger Point Dry Needling - 10/14/20 0001    Consent Given? Yes    Education Handout Provided Previously provided    Muscles Treated Head and Neck Upper trapezius;Cervical multifidi    Dry Needling Comments bil    Upper Trapezius Response Twitch reponse elicited;Palpable increased muscle length  Cervical multifidi Response Twitch reponse elicited;Palpable increased muscle length   left               PT Education - 10/14/20 0846    Education Details HEP - self traction    Person(s) Educated Patient    Methods Explanation;Demonstration;Handout    Comprehension Verbalized understanding;Returned demonstration            PT Short Term Goals - 09/23/20 0851      PT SHORT TERM GOAL #1   Title Will be compliant with appropriate HEP    Status Achieved      PT SHORT TERM GOAL #2   Title Pain will be no more than 5/10 at worst with a 50% reduction in numbness in order to show improvement of condition    Baseline pain intermittent, worse has been 5/10, numbness decreased 20%    Time 4    Status Partially Met    Target Date 10/21/20      PT SHORT TERM GOAL #3   Title Patient will be able to maintain upright posture during all functional tasks with good  biomechanics without cues in order to prevent exacerbating pain    Status Achieved             PT Long Term Goals - 09/23/20 0853      PT LONG TERM GOAL #1   Title Will demonstrate L shoulder MMT and ROM as being equal to that of R in order to show improvement of condition    Time 4    Period Weeks    Status On-going    Target Date 10/21/20      PT LONG TERM GOAL #2   Title Will be able to perform functional tasks such as crawling with good activation of deep cervical musculature/ability to keep cervical spine in neutral position in order to prevent exacerbation of condition at work    Baseline will occassionally have increased pain with crawling and looking up when under a house    Time 4    Period Weeks    Status Partially Met    Target Date 10/21/20      PT LONG TERM GOAL #3   Title Will experience peripheral symptoms no further than L humeral head in order to show improvement of condition and QOL    Status Achieved      PT LONG TERM GOAL #4   Title Will be compliant with long term management program including long term HEP, self cervical traction, and STM work in order to promote self care and long term management of chronic condition    Time 4    Status On-going    Target Date 10/21/20      PT LONG TERM GOAL #5   Title Improved FOTO score <34%    Baseline FOTO not in the system    Status Unable to assess                 Plan - 10/14/20 0954    Clinical Impression Statement Patient presents with increased pain since Monday to 8/10. He was able to tolerate TE today without increased pain. Self traction technique was added to HEP for pt to try during flare ups. He is still very tight in left pectorals and we spent a lot of time with prolonged open books and breathing to increase flexibility. Marked tightness in bil UT with excellent response to DN/manual.    PT Treatment/Interventions ADLs/Self Care Home Management;Cryotherapy;Electrical Stimulation;Iontophoresis  25m/ml Dexamethasone;Moist Heat;Traction;Ultrasound;Functional mobility training;Therapeutic activities;Therapeutic exercise;Patient/family education;Manual techniques;Passive range of motion;Dry needling;Energy conservation;Taping;Spinal Manipulations;Joint Manipulations    PT Next Visit Plan assess goals, manual/DN to Lt upper quarter. postural strengthening.    [facility]          Patient will benefit from skilled therapeutic intervention in order to improve the following deficits and impairments:  Decreased range of motion,Increased fascial restricitons,Increased muscle spasms,Impaired UE functional use,Impaired flexibility,Improper body mechanics,Decreased mobility,Decreased strength,Postural dysfunction  Visit Diagnosis: Cervicalgia  Weakness of left upper extremity  Abnormal posture     Problem List Patient Active Problem List   Diagnosis Date Noted  . Impingement syndrome, shoulder, left 07/30/2020  . 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 Occipital neuralgia 03/18/2020  . Phlebitis 02/01/2020  . Well adult exam 01/23/2020  . Cervical radiculopathy 12/22/2019  . Rectal bleeding 10/27/2019  . Folliculitis 083/43/7357 . Hydrocele 09/26/2017  . Hyperglycemia 08/26/2014  . Hyperkalemia 06/25/2014  . Abnormal CT scan, small bowel 04/13/2014  . Liver hemangioma 12/16/2012  . Urinary frequency 09/02/2012  . Bilateral inguinal hernia (BIH) 03/25/2012  . Esophageal reflux disease 09/03/2011  . Gastric ulcer due to Helicobacter pylori 189/78/4784 . Chronic back pain greater than 3 months duration 09/03/2011  . Neck pain, chronic 07/06/2011   JMadelyn FlavorsPT 10/14/2020, 10:00 AM  [facility] [place] [place] [address]Phone: [phone]  Fax:  [phone] Name: LMacon Sandiford LCottaMRN: [id number] of Birth: 4October 28, 1967

## 2020-10-18 ENCOUNTER — Ambulatory Visit (INDEPENDENT_AMBULATORY_CARE_PROVIDER_SITE_OTHER): Payer: No Typology Code available for payment source | Admitting: Physical Therapy

## 2020-10-18 ENCOUNTER — Other Ambulatory Visit: Payer: Self-pay

## 2020-10-18 DIAGNOSIS — R293 Abnormal posture: Secondary | ICD-10-CM | POA: Diagnosis not present

## 2020-10-18 DIAGNOSIS — R29898 Other symptoms and signs involving the musculoskeletal system: Secondary | ICD-10-CM | POA: Diagnosis not present

## 2020-10-18 DIAGNOSIS — M542 Cervicalgia: Secondary | ICD-10-CM

## 2020-10-18 NOTE — Therapy (Signed)
[place], [place], [address] Phone: [phone]   Fax:  [phone]  Physical Therapy Treatment  Patient Details  Name: Danny Crisostomo. Cohen MRN: [id number] Date of Birth: Oct 03, 1965 Referring Provider (PT): Dr. Pollyann Glen   Encounter Date: 10/18/2020   PT End of Session - 10/18/20 0811    Visit Number 15    Number of Visits 17    Date for PT Re-Evaluation 10/21/20    Authorization Type Aetna    Authorization Time Period 08/12/2020 to 09/23/2020    Authorization - Visit Number 15    Authorization - Number of Visits 17    PT Start Time 0804    PT Stop Time 0842    PT Time Calculation (min) 38 min    Activity Tolerance Patient tolerated treatment well    Behavior During Therapy [facility] for tasks assessed/performed           Past Medical History:  Diagnosis Date  . Chronic back pain greater than 3 months duration 09/03/2011  . Esophageal reflux disease 09/03/2011  . Gastric ulcer due to Helicobacter pylori 56/38/7564  . Peptic ulcer due to Helicobacter pylori 33/29/5188    Past Surgical History:  Procedure Laterality Date  . HERNIA REPAIR    . LAPAROSCOPIC INGUINAL HERNIA REPAIR  04/23/2012   bilateral    There were no vitals filed for this visit.   Subjective Assessment - 10/18/20 0810    Subjective Pt reports he has felt pretty good over the weekend.  He continues to do his HEP daily.  He used a towel roll in his pillow and thinks this "is working".    Currently in Pain? Yes    Pain Score 4     Pain Location Neck    Pain Orientation Left;Right    Pain Descriptors / Indicators Aching;Sore              [facility] PT Assessment - 10/18/20 0001      Assessment   Medical Diagnosis neck and shoulder pain     Referring Provider (PT) Dr. Pollyann Glen    Onset Date/Surgical Date --   acute on chronic    Next MD Visit 10/21/20    Prior Therapy PT at this clinic      AROM   Cervical - Right  Side Bend 42    Cervical - Left Side Bend 32   40 after DN     Strength   Overall Strength Comments midback, 5/5 on RUE, 4+/5 on LUE            [facility] Adult PT Treatment/Exercise - 10/18/20 0001      Lumbar Exercises: Quadruped   Madcat/Old Horse 5 reps    Other Quadruped Lumbar Exercises quadruped reaching arm towards ceiling (hand on hip) x 5 reps each side.    Other Quadruped Lumbar Exercises childs pose with/without lateral trunk flexion      Shoulder Exercises: Prone   Other Prone Exercises POE with shoulder reach and holding neck retraction x 5 bil.  .    Other Prone Exercises opp arm/leg lift x 10 each side, with thumb up. Goal post to/from superman x 3 rep - stopped due to Lt shoulder pain.      Shoulder Exercises: Sidelying   Other Sidelying Exercises open book with top leg flexed forward x 5 reps, 20 sec hold (Lt rotation), 1 rep with Rt rotation.      Shoulder Exercises: ROM/Strengthening   Nustep  L5: arms / legs x 5 min      Neck Exercises: Stretches   Upper Trapezius Stretch Right;Left;2 reps;20 seconds            Trigger Point Dry Needling - 10/18/20 0001    Consent Given? Yes    Education Handout Provided Previously provided    Muscles Treated Head and Neck Upper trapezius;Levator scapulae   Rt   Upper Trapezius Response Twitch reponse elicited;Palpable increased muscle length    Levator Scapulae Response Twitch response elicited;Palpable increased muscle length                  PT Short Term Goals - 09/23/20 0851      PT SHORT TERM GOAL #1   Title Will be compliant with appropriate HEP    Status Achieved      PT SHORT TERM GOAL #2   Title Pain will be no more than 5/10 at worst with a 50% reduction in numbness in order to show improvement of condition    Baseline pain intermittent, worse has been 5/10, numbness decreased 20%    Time 4    Status Partially Met    Target Date 10/21/20      PT SHORT TERM GOAL #3   Title Patient will be able  to maintain upright posture during all functional tasks with good biomechanics without cues in order to prevent exacerbating pain    Status Achieved             PT Long Term Goals - 10/18/20 0815      PT LONG TERM GOAL #1   Title Will demonstrate L shoulder MMT and ROM as being equal to that of R in order to show improvement of condition    Time 4    Period Weeks    Status Partially Met      PT LONG TERM GOAL #2   Title Will be able to perform functional tasks such as crawling with good activation of deep cervical musculature/ability to keep cervical spine in neutral position in order to prevent exacerbation of condition at work    Baseline will occassionally have increased pain with crawling and looking up when under a house    Time 4    Period Weeks    Status Partially Met      PT LONG TERM GOAL #3   Title Will experience peripheral symptoms no further than L humeral head in order to show improvement of condition and QOL    Status Achieved      PT LONG TERM GOAL #4   Title Will be compliant with long term management program including long term HEP, self cervical traction, and STM work in order to promote self care and long term management of chronic condition    Time 4    Status On-going      PT LONG TERM GOAL #5   Title --    Baseline --    Status --                 Plan - 10/18/20 9935    Clinical Impression Statement Pt reports increased pain in Lt [facility] joint with prone goal post to/from superman exercise.  Improved Lt shoulder strength.  Pt verbalizing better understanding of postural positions affect on his neck and symptoms.  Pt's lateral neck flexion is improving.  Pt reporting more intermittent (vs constant) LUE radicular symptoms with work now.  Pt has partially met his goals. Pt returns  to MD same day as next visit.    Rehab Potential Good    PT Frequency 2x / week    PT Duration 4 weeks    PT Treatment/Interventions ADLs/Self Care Home  Management;Cryotherapy;Electrical Stimulation;Iontophoresis 24m/ml Dexamethasone;Moist Heat;Traction;Ultrasound;Functional mobility training;Therapeutic activities;Therapeutic exercise;Patient/family education;Manual techniques;Passive range of motion;Dry needling;Energy conservation;Taping;Spinal Manipulations;Joint Manipulations    PT Next Visit Plan MD note    PT Home Exercise Plan [facility]          Patient will benefit from skilled therapeutic intervention in order to improve the following deficits and impairments:  Decreased range of motion,Increased fascial restricitons,Increased muscle spasms,Impaired UE functional use,Impaired flexibility,Improper body mechanics,Decreased mobility,Decreased strength,Postural dysfunction  Visit Diagnosis: Cervicalgia  Weakness of left upper extremity  Abnormal posture     Problem List Patient Active Problem List   Diagnosis Date Noted  . Impingement syndrome, shoulder, left 07/30/2020  . 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 Occipital neuralgia 03/18/2020  . Phlebitis 02/01/2020  . Well adult exam 01/23/2020  . Cervical radiculopathy 12/22/2019  . Rectal bleeding 10/27/2019  . Folliculitis 070/26/3785 . Hydrocele 09/26/2017  . Hyperglycemia 08/26/2014  . Hyperkalemia 06/25/2014  . Abnormal CT scan, small bowel 04/13/2014  . Liver hemangioma 12/16/2012  . Urinary frequency 09/02/2012  . Bilateral inguinal hernia (BIH) 03/25/2012  . Esophageal reflux disease 09/03/2011  . Gastric ulcer due to Helicobacter pylori 188/50/2774 . Chronic back pain greater than 3 months duration 09/03/2011  . Neck pain, chronic 07/06/2011   JKerin Perna PTA 10/18/20 8:44 AM  [facility] [address]Phone: [phone]  Fax:  [phone] Name: Danny Quinby LGingMRN: [id number] of Birth: 4Dec 20, 1967 JMadelyn Flavors PT 10/18/20 8:46 AM  [facility] [address] [phone](office) [phone](fax)

## 2020-10-21 ENCOUNTER — Encounter: Payer: Self-pay | Admitting: Physical Therapy

## 2020-10-21 ENCOUNTER — Other Ambulatory Visit: Payer: Self-pay

## 2020-10-21 ENCOUNTER — Ambulatory Visit (INDEPENDENT_AMBULATORY_CARE_PROVIDER_SITE_OTHER): Payer: No Typology Code available for payment source | Admitting: Physical Therapy

## 2020-10-21 ENCOUNTER — Ambulatory Visit (INDEPENDENT_AMBULATORY_CARE_PROVIDER_SITE_OTHER): Payer: No Typology Code available for payment source | Admitting: Sports Medicine

## 2020-10-21 ENCOUNTER — Ambulatory Visit (INDEPENDENT_AMBULATORY_CARE_PROVIDER_SITE_OTHER): Payer: No Typology Code available for payment source

## 2020-10-21 DIAGNOSIS — R29898 Other symptoms and signs involving the musculoskeletal system: Secondary | ICD-10-CM

## 2020-10-21 DIAGNOSIS — M542 Cervicalgia: Secondary | ICD-10-CM | POA: Diagnosis not present

## 2020-10-21 DIAGNOSIS — R293 Abnormal posture: Secondary | ICD-10-CM

## 2020-10-21 DIAGNOSIS — M7542 Impingement syndrome of left shoulder: Secondary | ICD-10-CM | POA: Diagnosis not present

## 2020-10-21 NOTE — Therapy (Signed)
[place], [place], [address] Phone: [phone]   Fax:  [phone]  Physical Therapy Treatment/Discharge  Patient Details  Name: Danny Cohen MRN: [id number] Date of Birth: 1966/03/11 Referring Provider (PT): Dr. Pollyann Glen   Encounter Date: 10/21/2020   PT End of Session - 10/21/20 0759    Visit Number 16    Date for PT Re-Evaluation 10/21/20    Authorization Type Aetna    PT Start Time 0759    PT Stop Time 0838    PT Time Calculation (min) 39 min    Activity Tolerance Patient tolerated treatment well    Behavior During Therapy [facility] for tasks assessed/performed           Past Medical History:  Diagnosis Date  . Chronic back pain greater than 3 months duration 09/03/2011  . Esophageal reflux disease 09/03/2011  . Gastric ulcer due to Helicobacter pylori 29/93/7169  . Peptic ulcer due to Helicobacter pylori 67/89/3810    Past Surgical History:  Procedure Laterality Date  . HERNIA REPAIR    . LAPAROSCOPIC INGUINAL HERNIA REPAIR  04/23/2012   bilateral    There were no vitals filed for this visit.   Subjective Assessment - 10/21/20 0759    Subjective Pt sees MD after todays visit, he reports more movement however the popping is still present and the last couple of days his shoulders have really been sore.    Currently in Pain? Yes    Pain Score 5    last couple of days it was higher   Pain Location Shoulder    Pain Orientation Left;Right    Pain Descriptors / Indicators Aching;Sore    Pain Type Chronic pain    Pain Onset More than a month ago    Pain Frequency Constant              [facility] PT Assessment - 10/21/20 0001      Assessment   Medical Diagnosis neck and shoulder pain     Referring Provider (PT) Dr. Pollyann Glen      AROM   Right/Left Shoulder --   pain Lt side with reaching behind, bilat to T10, ROM [facility]   Cervical Flexion 45    Cervical Extension 47   pain Lt  side with all cervical motion   Cervical - Right Side Bend 22    Cervical - Left Side Bend 32    Cervical - Right Rotation 71    Cervical - Left Rotation 75      Strength   Overall Strength Comments --   bilat shoulders 5/5, ER 5-/5, grip Lt 95/98/100, Rt 115/115/110, mid back 5/5     Palpation   Spinal mobility some hypomobility in cervical spine wiht CPA mobs    Palpation comment very tight in bilat upper traps and levators                         [facility] Adult PT Treatment/Exercise - 10/21/20 0001      Self-Care   Other Self-Care Comments  thera cane  for TPR  to upper shoulders and neck      Neck Exercises: Sidelying   Other Sidelying Exercise open book stretches then around the clock stretch each side      Shoulder Exercises: ROM/Strengthening   UBE (Upper Arm Bike) L4 x 4 min      Neck Exercises: Stretches   Other Neck Stretches thoracic openers  over bolster, T's then angels                    PT Short Term Goals - 10/21/20 0820      PT SHORT TERM GOAL #1   Title Will be compliant with appropriate HEP    Status Achieved      PT SHORT TERM GOAL #2   Title Pain will be no more than 5/10 at worst with a 50% reduction in numbness in order to show improvement of condition    Baseline numbness is pretty much gone now - pain goes up to 5-7/10    Status Partially Met      PT SHORT TERM GOAL #3   Title Patient will be able to maintain upright posture during all functional tasks with good biomechanics without cues in order to prevent exacerbating pain    Status Achieved             PT Long Term Goals - 10/21/20 6440      PT LONG TERM GOAL #1   Title Will demonstrate L shoulder MMT and ROM as being equal to that of R in order to show improvement of condition    Status Achieved      PT LONG TERM GOAL #2   Title Will be able to perform functional tasks such as crawling with good activation of deep cervical musculature/ability to keep cervical  spine in neutral position in order to prevent exacerbation of condition at work    Baseline this still causes a flare up of pain, his HEP does help settle the  pain down some.    Status Not Met      PT LONG TERM GOAL #3   Title Will experience peripheral symptoms no further than L humeral head in order to show improvement of condition and QOL    Status Achieved      PT LONG TERM GOAL #4   Title Will be compliant with long term management program including long term HEP, self cervical traction, and STM work in order to promote self care and long term management of chronic condition      PT LONG TERM GOAL #5   Title ---                 Plan - 10/21/20 3474    Clinical Impression Statement Danny Cohen has improved cervical and shoulder ROM as well as normalzed upper back strength.  Lt grip is ~ 15# less than Rt, this is more than would be expected.  He has partially met his goals, pain is the biggest limiting factor in his daily activity.  Pt does have temporary relief with dry needling, has had 7 sessions of this and the  tightness and pain returns. Recommend patient stop therapy at this time as he has platued.  He will follow up with MD for next steps    PT Next Visit Plan D/C to HEP and return to MD    PT [place] and Agree with Plan of Care Patient           Patient will benefit from skilled therapeutic intervention in order to improve the following deficits and impairments:     Visit Diagnosis: Cervicalgia  Weakness of left upper extremity  Abnormal posture     Problem List Patient Active Problem List   Diagnosis Date Noted  . Impingement syndrome, shoulder, left 07/30/2020  . Arthritis of first metatarsophalangeal (  MTP) joint of right foot 07/02/2020  . Occipital neuralgia 03/18/2020  . Phlebitis 02/01/2020  . Well adult exam 01/23/2020  . Cervical radiculopathy 12/22/2019  . Rectal bleeding 10/27/2019  . Folliculitis 37/34/2876  .  Hydrocele 09/26/2017  . Hyperglycemia 08/26/2014  . Hyperkalemia 06/25/2014  . Abnormal CT scan, small bowel 04/13/2014  . Liver hemangioma 12/16/2012  . Urinary frequency 09/02/2012  . Bilateral inguinal hernia (BIH) 03/25/2012  . Esophageal reflux disease 09/03/2011  . Gastric ulcer due to Helicobacter pylori 81/15/7262  . Chronic back pain greater than 3 months duration 09/03/2011  . Neck pain, chronic 07/06/2011    Danny Cohen,SUE 10/21/2020, 8:37 AM  [facility] [place], [place], [address] Phone: [phone]   Fax:  [phone]  Name: Danny Cohen MRN: [id number] Date of Birth: 07-Dec-1965   PHYSICAL THERAPY DISCHARGE SUMMARY  Visits from Start of Care: 16  Current functional level related to goals / functional outcomes: See measurements above for ROM/strength and goal update   Remaining deficits: Tightness in bilat upper shoulders and neck and pain with daily activities   Education / Equipment: HEP and DN  Plan: Patient agrees to discharge.  Patient goals were partially met. Patient is being discharged due to lack of progress.  ?????    Jeral Pinch, PT 10/21/20 8:37 AM

## 2020-10-21 NOTE — Progress Notes (Signed)
    Procedures performed today:    Procedure: Real-time Ultrasound Guided injection of the left acromioclavicular joint Device: Samsung HS60  Verbal informed consent obtained.  Time-out conducted.  Noted no overlying erythema, induration, or other signs of local infection.  Skin prepped in a sterile fashion.  Local anesthesia: Topical Ethyl chloride.  With sterile technique and under real time ultrasound guidance:  Noted geyser sign, 1/2 cc kenalog 40, 1/2 cc lidocaine injected easily.   Completed without difficulty  Advised to call if fevers/chills, erythema, induration, drainage, or persistent bleeding.  Images permanently stored and available for review in PACS.  Impression: Technically successful ultrasound guided injection.  Independent interpretation of notes and tests performed by another provider:   None.  Brief History, Exam, Impression, and Recommendations:    Impingement syndrome, shoulder, left Impingement symptoms have improved considerably with physical therapy and conservative treatment, he does have a swollen acromioclavicular joint with tenderness to palpation, and a positive crossarm sign, we are going to inject his acromioclavicular joint today, return to see me in 1 month to 6 weeks.    ___________________________________________ Gwen Her. Dianah Field, M.D., ABFM., CAQSM. Primary Care and [place] Instructor of [employer] [facility] [employer]

## 2020-10-21 NOTE — Assessment & Plan Note (Signed)
Impingement symptoms have improved considerably with physical therapy and conservative treatment, he does have a swollen acromioclavicular joint with tenderness to palpation, and a positive crossarm sign, we are going to inject his acromioclavicular joint today, return to see me in 1 month to 6 weeks.

## 2020-10-25 ENCOUNTER — Encounter: Payer: No Typology Code available for payment source | Admitting: Physical Therapy

## 2020-10-28 ENCOUNTER — Encounter: Payer: No Typology Code available for payment source | Admitting: Physical Therapy

## 2020-11-12 ENCOUNTER — Other Ambulatory Visit: Payer: Self-pay | Admitting: Sports Medicine

## 2020-11-12 DIAGNOSIS — M5412 Radiculopathy, cervical region: Secondary | ICD-10-CM

## 2020-11-12 MED ORDER — GABAPENTIN 600 MG PO TABS
ORAL_TABLET | ORAL | 3 refills | Status: DC
Start: 2020-11-12 — End: 2021-04-29

## 2020-12-02 ENCOUNTER — Ambulatory Visit: Payer: No Typology Code available for payment source | Admitting: Sports Medicine

## 2020-12-17 ENCOUNTER — Encounter: Payer: Self-pay | Admitting: Family Medicine

## 2020-12-17 ENCOUNTER — Ambulatory Visit (INDEPENDENT_AMBULATORY_CARE_PROVIDER_SITE_OTHER): Payer: No Typology Code available for payment source | Admitting: Family Medicine

## 2020-12-17 ENCOUNTER — Other Ambulatory Visit: Payer: Self-pay

## 2020-12-17 VITALS — BP 110/88 | HR 84 | Temp 98.0°F | Ht 66.0 in | Wt 162.6 lb

## 2020-12-17 DIAGNOSIS — R29898 Other symptoms and signs involving the musculoskeletal system: Secondary | ICD-10-CM

## 2020-12-17 DIAGNOSIS — M25512 Pain in left shoulder: Secondary | ICD-10-CM | POA: Diagnosis not present

## 2020-12-17 DIAGNOSIS — M5412 Radiculopathy, cervical region: Secondary | ICD-10-CM | POA: Diagnosis not present

## 2020-12-17 DIAGNOSIS — G8929 Other chronic pain: Secondary | ICD-10-CM

## 2020-12-17 DIAGNOSIS — K625 Hemorrhage of anus and rectum: Secondary | ICD-10-CM

## 2020-12-17 DIAGNOSIS — M7542 Impingement syndrome of left shoulder: Secondary | ICD-10-CM

## 2020-12-17 NOTE — Patient Instructions (Signed)
Great to see you today! MRI's have been ordered.

## 2020-12-17 NOTE — Assessment & Plan Note (Signed)
Continues to have intermittent episodes of bleeding.  Colonoscopy last year was unremarkable.  Question diverticular or from internal hemorrhoids.  Recommend increase fiber intake.

## 2020-12-17 NOTE — Assessment & Plan Note (Signed)
Continued pain despite >6 weeks of physician directed conservative therapy with physical therapy and injection.  Recommend additional imaging to evaluate for rotator cuff pathology.  MRI L shoulder ordered.

## 2020-12-17 NOTE — Assessment & Plan Note (Signed)
Continued paint despite physician directed conservative care with medication and 6 weeks of physical therapy.  I think additional imaging is warranted at this time. MRI of cervical spine ordered.

## 2020-12-17 NOTE — Progress Notes (Signed)
Danny Cohen - 55 y.o. male MRN [id number]  Date of birth: 05-Mar-1966  Subjective Chief Complaint  Patient presents with  . Follow-up    HPI Danny Cohen is a 55 year old male here today for follow-up of chronic neck pain.  He has also been experiencing some left shoulder pain.  He did see Dr. Dianah Field recently and had an injection into his left [facility] joint as well as x-rays of the left shoulder.  He has completed a course of physical therapy however did not note significant improvement with either of these.  He discussed with Dr. Dianah Field getting an MRI of his shoulder for further evaluation if pain did not improve.  He also continues to have cervical radicular pain.  He has been managing this fairly well with combination of gabapentin and Celebrex however symptoms do persist.  Physical therapy has not provided much relief at this time.  We have never done MRI of his neck.  He denies any associated weakness with this.  He does also note some continued episodes of blood in his stool.  He did have colonoscopy last year which was unremarkable.  He denies any symptoms of anemia including dizziness, lightheadedness or fatigue.  ROS:  A comprehensive ROS was completed and negative except as noted per HPI  Allergies  Allergen Reactions  . Nsaids Other (See Comments)    Pt has history of peptic ulcer     Past Medical History:  Diagnosis Date  . Chronic back pain greater than 3 months duration 09/03/2011  . Esophageal reflux disease 09/03/2011  . Gastric ulcer due to Helicobacter pylori 94/85/4627  . Peptic ulcer due to Helicobacter pylori 03/50/0938    Past Surgical History:  Procedure Laterality Date  . HERNIA REPAIR    . LAPAROSCOPIC INGUINAL HERNIA REPAIR  04/23/2012   bilateral    Social History   Socioeconomic History  . Marital status: Married    Spouse name: Not on file  . Number of children: Not on file  . Years of education: Not on file  . Highest education level: Not on  file  Occupational History  . Not on file  Tobacco Use  . Smoking status: Former Smoker    Quit date: 09/11/2000    Years since quitting: 20.2  . Smokeless tobacco: Never Used  Substance and Sexual Activity  . Alcohol use: No  . Drug use: No  . Sexual activity: Not on file  Other Topics Concern  . Not on file  Social History Narrative  . Not on file   Social Determinants of Health   Financial Resource Strain: Not on file  Food Insecurity: Not on file  Transportation Needs: Not on file  Physical Activity: Not on file  Stress: Not on file  Social Connections: Not on file    Family History  Problem Relation Age of Onset  . Heart failure Mother     Health Maintenance  Topic Date Due  . Hepatitis C Screening  Never done  . HIV Screening  Never done  . INFLUENZA VACCINE  04/11/2021  . TETANUS/TDAP  08/30/2021  . Fecal DNA (Cologuard)  12/03/2022  . COVID-19 Vaccine  Completed  . HPV VACCINES  Aged Out     ----------------------------------------------------------------------------------------------------------------------------------------------------------------------------------------------------------------- Physical Exam BP 110/88 (BP Location: Left Arm, Patient Position: Sitting, Cuff Size: Normal)   Pulse 84   Temp 98 F (36.7 C)   Ht 5\' 6"  (1.676 m)   Wt 162 lb 9.6 oz (73.8 kg)   PF 98  L/min   BMI 26.24 kg/m   Physical Exam Constitutional:      Appearance: Normal appearance.  Eyes:     General: No scleral icterus. Cardiovascular:     Rate and Rhythm: Normal rate and regular rhythm.  Pulmonary:     Effort: Pulmonary effort is normal.     Breath sounds: Normal breath sounds.  Musculoskeletal:     Cervical back: Neck supple.  Neurological:     General: No focal deficit present.     Mental Status: He is alert.  Psychiatric:        Mood and Affect: Mood normal.        Behavior: Behavior normal.      ------------------------------------------------------------------------------------------------------------------------------------------------------------------------------------------------------------------- Assessment and Plan  Cervical radiculopathy Continued paint despite physician directed conservative care with medication and 6 weeks of physical therapy.  I think additional imaging is warranted at this time. MRI of cervical spine ordered.   Impingement syndrome, shoulder, left Continued pain despite >6 weeks of physician directed conservative therapy with physical therapy and injection.  Recommend additional imaging to evaluate for rotator cuff pathology.  MRI L shoulder ordered.    Rectal bleeding Continues to have intermittent episodes of bleeding.  Colonoscopy last year was unremarkable.  Question diverticular or from internal hemorrhoids.  Recommend increase fiber intake.   No orders of the defined types were placed in this encounter.   Return in about 3 months (around 03/18/2021) for annual exam.    This visit occurred during the SARS-CoV-2 public health emergency.  Safety protocols were in place, including screening questions prior to the visit, additional usage of staff PPE, and extensive cleaning of exam room while observing appropriate contact time as indicated for disinfecting solutions.

## 2020-12-22 ENCOUNTER — Ambulatory Visit: Payer: No Typology Code available for payment source | Admitting: Family Medicine

## 2020-12-29 ENCOUNTER — Telehealth: Payer: Self-pay

## 2020-12-29 NOTE — Telephone Encounter (Signed)
LVM advising patient that MRI's were approved. Imaging should be contacting the patient for scheduling.

## 2021-01-02 ENCOUNTER — Other Ambulatory Visit: Payer: Self-pay

## 2021-01-02 ENCOUNTER — Ambulatory Visit (INDEPENDENT_AMBULATORY_CARE_PROVIDER_SITE_OTHER): Payer: No Typology Code available for payment source

## 2021-01-02 DIAGNOSIS — R29898 Other symptoms and signs involving the musculoskeletal system: Secondary | ICD-10-CM | POA: Diagnosis not present

## 2021-01-02 DIAGNOSIS — M542 Cervicalgia: Secondary | ICD-10-CM

## 2021-01-02 DIAGNOSIS — M25512 Pain in left shoulder: Secondary | ICD-10-CM

## 2021-01-02 DIAGNOSIS — M5412 Radiculopathy, cervical region: Secondary | ICD-10-CM

## 2021-01-02 DIAGNOSIS — G8929 Other chronic pain: Secondary | ICD-10-CM | POA: Diagnosis not present

## 2021-01-27 ENCOUNTER — Ambulatory Visit (INDEPENDENT_AMBULATORY_CARE_PROVIDER_SITE_OTHER): Payer: No Typology Code available for payment source | Admitting: Sports Medicine

## 2021-01-27 ENCOUNTER — Encounter: Payer: Self-pay | Admitting: Sports Medicine

## 2021-01-27 ENCOUNTER — Other Ambulatory Visit: Payer: Self-pay

## 2021-01-27 ENCOUNTER — Ambulatory Visit (INDEPENDENT_AMBULATORY_CARE_PROVIDER_SITE_OTHER): Payer: No Typology Code available for payment source

## 2021-01-27 DIAGNOSIS — M5412 Radiculopathy, cervical region: Secondary | ICD-10-CM

## 2021-01-27 DIAGNOSIS — M25512 Pain in left shoulder: Secondary | ICD-10-CM

## 2021-01-27 DIAGNOSIS — G8929 Other chronic pain: Secondary | ICD-10-CM

## 2021-01-27 NOTE — Assessment & Plan Note (Signed)
Acromioclavicular pain is doing well postinjection, unfortunately he continued to have pain at the joint lines, MRI ultimately showed a labral tear so today we proceeded with a glenohumeral injection, if this fails we will refer for shoulder arthroscopy.

## 2021-01-27 NOTE — Progress Notes (Signed)
    Procedures performed today:    Procedure: Real-time Ultrasound Guided injection of the left glenohumeral joint Device: Samsung [id number]  Verbal informed consent obtained.  Time-out conducted.  Noted no overlying erythema, induration, or other signs of local infection.  Skin prepped in a sterile fashion.  Local anesthesia: Topical Ethyl chloride.  With sterile technique and under real time ultrasound guidance:  Noted arthritic joint, 1 cc Kenalog 40, 2 cc lidocaine, 2 cc bupivacaine injected easily Completed without difficulty  Advised to call if fevers/chills, erythema, induration, drainage, or persistent bleeding.  Images permanently stored and available for review in PACS.  Impression: Technically successful ultrasound guided injection.  Independent interpretation of notes and tests performed by another provider:   C-spine MRI personally reviewed, there is C6-C7 DDD with mild central stenosis.  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  Cervical radiculopathy Chronic neck pain, C6-C7 DDD on MRI. He has failed formal physical therapy so we will proceed with a C6-C7 interlaminar epidural. Return to see me 1 month after epidural.    ___________________________________________ Gwen Her. Dianah Field, M.D., ABFM., CAQSM. Primary Care and [place] Instructor of [place] [facility]

## 2021-01-27 NOTE — Assessment & Plan Note (Signed)
Chronic neck pain, C6-C7 DDD on MRI. He has failed formal physical therapy so we will proceed with a C6-C7 interlaminar epidural. Return to see me 1 month after epidural.

## 2021-01-27 NOTE — Patient Instructions (Signed)
Epidural Steroid Injection Patient Information  Description: The epidural space surrounds the nerves as they exit the spinal cord.  In some patients, the nerves can be compressed and inflamed by a bulging disc or a tight spinal canal (spinal stenosis).  By injecting steroids into the epidural space, we can bring irritated nerves into direct contact with a potentially helpful medication.  These steroids act directly on the irritated nerves and can reduce swelling and inflammation which often leads to decreased pain.  Epidural steroids may be injected anywhere along the spine and from the neck to the low back depending upon the location of your pain.   After numbing the skin with local anesthetic (like Novocaine), a small needle is passed into the epidural space slowly.  You may experience a sensation of pressure while this is being done.  The entire block usually last less than 10 minutes.  Conditions which may be treated by epidural steroids:   Low back and leg pain  Neck and arm pain  Spinal stenosis  Post-laminectomy syndrome  Herpes zoster (shingles) pain  Pain from compression fractures  Preparation for the injection:  1. Do not eat any solid food or dairy products within 8 hours of your appointment.  2. You may drink clear liquids up to 3 hours before appointment.  Clear liquids include water, black coffee, juice or soda.  No milk or cream please. 3. You may take your regular medication, including pain medications, with a sip of water before your appointment  Diabetics should hold regular insulin (if taken separately) and take 1/2 normal NPH dos the morning of the procedure.  Carry some sugar containing items with you to your appointment. 4. A driver must accompany you and be prepared to drive you home after your procedure.  5. Bring all your current medications with your. 6. An IV may be inserted and sedation may be given at the discretion of the physician.   7. A blood pressure  cuff, EKG and other monitors will often be applied during the procedure.  Some patients may need to have extra oxygen administered for a short period. 8. You will be asked to provide medical information, including your allergies, prior to the procedure.  We must know immediately if you are taking blood thinners (like Coumadin/Warfarin)  Or if you are allergic to IV iodine contrast (dye). We must know if you could possible be pregnant.  Possible side-effects:  Bleeding from needle site  Infection (rare, may require surgery)  Nerve injury (rare)  Numbness & tingling (temporary)  Difficulty urinating (rare, temporary)  Spinal headache ( a headache worse with upright posture)  Light -headedness (temporary)  Pain at injection site (several days)  Decreased blood pressure (temporary)  Weakness in arm/leg (temporary)  Pressure sensation in back/neck (temporary)  Call if you experience:  Fever/chills associated with headache or increased back/neck pain.  Headache worsened by an upright position.  New onset weakness or numbness of an extremity below the injection site  Hives or difficulty breathing (go to the emergency room)  Inflammation or drainage at the infection site  Severe back/neck pain  Any new symptoms which are concerning to you  Please note:  Although the local anesthetic injected can often make your back or neck feel good for several hours after the injection, the pain will likely return.  It takes 3-7 days for steroids to work in the epidural space.  You may not notice any pain relief for at least that one week.    If effective, we will often do a series of three injections spaced 3-6 weeks apart to maximally decrease your pain.  After the initial series, we generally will wait several months before considering a repeat injection of the same type.  If you have any questions, please call [phone] [facility] 

## 2021-02-08 ENCOUNTER — Telehealth: Payer: Self-pay

## 2021-02-08 NOTE — Telephone Encounter (Signed)
Received VM from [facility] that an alternative medication was needed for patient. Patient has been on Celebrex since November and has tried ibuprofen and meloxicam without improvement. PA completed and submitted through Covermymeds; awaiting response.

## 2021-02-14 NOTE — Telephone Encounter (Signed)
Fax received with approval good from 01/09/21 through 02/08/2022. Faxed to pharmacy. Sent to scan in patient's chart.

## 2021-02-21 ENCOUNTER — Telehealth: Payer: Self-pay

## 2021-02-21 NOTE — Telephone Encounter (Signed)
The timing is not crucially significant.

## 2021-02-21 NOTE — Telephone Encounter (Signed)
Pt called and asked if he needs to get a shingles vaccine before his epidural on 6/17. Please advise, thanks.

## 2021-02-25 ENCOUNTER — Other Ambulatory Visit: Payer: Self-pay

## 2021-02-25 ENCOUNTER — Ambulatory Visit
Admission: RE | Admit: 2021-02-25 | Discharge: 2021-02-25 | Disposition: A | Discharge status: Home / Self Care | Payer: No Typology Code available for payment source | Source: Ambulatory Visit | Attending: Sports Medicine | Admitting: Sports Medicine

## 2021-02-25 DIAGNOSIS — M5412 Radiculopathy, cervical region: Secondary | ICD-10-CM

## 2021-02-25 MED ORDER — TRIAMCINOLONE ACETONIDE 40 MG/ML IJ SUSP (RADIOLOGY)
60.0000 mg | Freq: Once | INTRAMUSCULAR | Status: AC
  Administered 2021-02-25: 60 mg via EPIDURAL

## 2021-02-25 MED ORDER — IOPAMIDOL (ISOVUE-M 300) INJECTION 61%
1.0000 mL | Freq: Once | INTRAMUSCULAR | Status: AC | PRN
  Administered 2021-02-25: 1 mL via EPIDURAL

## 2021-02-25 NOTE — Discharge Instructions (Signed)

## 2021-03-21 ENCOUNTER — Other Ambulatory Visit: Payer: Self-pay

## 2021-03-21 ENCOUNTER — Ambulatory Visit (INDEPENDENT_AMBULATORY_CARE_PROVIDER_SITE_OTHER): Payer: No Typology Code available for payment source | Admitting: Family Medicine

## 2021-03-21 ENCOUNTER — Encounter: Payer: Self-pay | Admitting: Family Medicine

## 2021-03-21 VITALS — BP 128/75 | HR 62 | Temp 97.8°F | Ht 66.0 in | Wt 152.9 lb

## 2021-03-21 DIAGNOSIS — Z0000 Encounter for general adult medical examination without abnormal findings: Secondary | ICD-10-CM

## 2021-03-21 DIAGNOSIS — Z125 Encounter for screening for malignant neoplasm of prostate: Secondary | ICD-10-CM | POA: Diagnosis not present

## 2021-03-21 DIAGNOSIS — Z13220 Encounter for screening for lipoid disorders: Secondary | ICD-10-CM | POA: Diagnosis not present

## 2021-03-21 MED ORDER — SUCRALFATE 1 G PO TABS: 1.0000 g | ORAL_TABLET | Freq: Three times a day (TID) | ORAL | 0 refills | Status: DC

## 2021-03-21 NOTE — Patient Instructions (Signed)
Preventive Care 40-53 Years Old, Male Preventive care refers to lifestyle choices and visits with your health care provider that can promote health and wellness. This includes: A yearly physical exam. This is also called an annual wellness visit. Regular dental and eye exams. Immunizations. Screening for certain conditions. Healthy lifestyle choices, such as: Eating a healthy diet. Getting regular exercise. Not using drugs or products that contain nicotine and tobacco. Limiting alcohol use. What can I expect for my preventive care visit? Physical exam Your health care provider will check your: Height and weight. These may be used to calculate your BMI (body mass index). BMI is a measurement that tells if you are at a healthy weight. Heart rate and blood pressure. Body temperature. Skin for abnormal spots. Counseling Your health care provider may ask you questions about your: Past medical problems. Family's medical history. Alcohol, tobacco, and drug use. Emotional well-being. Home life and relationship well-being. Sexual activity. Diet, exercise, and sleep habits. Work and work environment. Access to firearms. What immunizations do I need?  Vaccines are usually given at various ages, according to a schedule. Your health care provider will recommend vaccines for you based on your age, medicalhistory, and lifestyle or other factors, such as travel or where you work. What tests do I need? Blood tests Lipid and cholesterol levels. These may be checked every 5 years, or more often if you are over 50 years old. Hepatitis C test. Hepatitis B test. Screening Lung cancer screening. You may have this screening every year starting at age 55 if you have a 30-pack-year history of smoking and currently smoke or have quit within the past 15 years. Prostate cancer screening. Recommendations will vary depending on your family history and other risks. Genital exam to check for testicular cancer  or hernias. Colorectal cancer screening. All adults should have this screening starting at age 50 and continuing until age 75. Your health care provider may recommend screening at age 45 if you are at increased risk. You will have tests every 1-10 years, depending on your results and the type of screening test. Diabetes screening. This is done by checking your blood sugar (glucose) after you have not eaten for a while (fasting). You may have this done every 1-3 years. STD (sexually transmitted disease) testing, if you are at risk. Follow these instructions at home: Eating and drinking  Eat a diet that includes fresh fruits and vegetables, whole grains, lean protein, and low-fat dairy products. Take vitamin and mineral supplements as recommended by your health care provider. Do not drink alcohol if your health care provider tells you not to drink. If you drink alcohol: Limit how much you have to 0-2 drinks a day. Be aware of how much alcohol is in your drink. In the [place] one drink equals one 12 oz bottle of beer (355 mL), one 5 oz glass of wine (148 mL), or one 1 oz glass of hard liquor (44 mL).  Lifestyle Take daily care of your teeth and gums. Brush your teeth every morning and night with fluoride toothpaste. Floss one time each day. Stay active. Exercise for at least 30 minutes 5 or more days each week. Do not use any products that contain nicotine or tobacco, such as cigarettes, e-cigarettes, and chewing tobacco. If you need help quitting, ask your health care provider. Do not use drugs. If you are sexually active, practice safe sex. Use a condom or other form of protection to prevent STIs (sexually transmitted infections). If told by   your health care provider, take low-dose aspirin daily starting at age 50. Find healthy ways to cope with stress, such as: Meditation, yoga, or listening to music. Journaling. Talking to a trusted person. Spending time with friends and  family. Safety Always wear your seat belt while driving or riding in a vehicle. Do not drive: If you have been drinking alcohol. Do not ride with someone who has been drinking. When you are tired or distracted. While texting. Wear a helmet and other protective equipment during sports activities. If you have firearms in your house, make sure you follow all gun safety procedures. What's next? Go to your health care provider once a year for an annual wellness visit. Ask your health care provider how often you should have your eyes and teeth checked. Stay up to date on all vaccines. This information is not intended to replace advice given to you by your health care provider. Make sure you discuss any questions you have with your healthcare provider. Document Revised: 05/27/2019 Document Reviewed: 08/22/2018 Elsevier Patient Education  2022 Elsevier Inc.  

## 2021-03-21 NOTE — Assessment & Plan Note (Signed)
Well Adult Orders Placed This Encounter  Procedures  . COMPLETE METABOLIC PANEL WITH GFR  . CBC with Differential  . Lipid Panel w/reflex Direct LDL  . PSA  Screening: PSA, lipid Immunizations:  He will wait on shingles vaccine Anticipatory guidance/risk factor reduction: Encouraged to continue healthy lifestyle habits.  He will continue increased fiber.  Additional recommendations per AVS.

## 2021-03-21 NOTE — Progress Notes (Addendum)
Danny Cohen. Foot - 55 y.o. male MRN [id number]  Date of birth: 07/19/1966  Subjective Chief Complaint  Patient presents with   Follow-up    HPI Danny Cohen is a 55 year old male here today for annual exam.  He continues to have neck and shoulder pain.  He did get some relief for a few weeks after having neck injection.  He plans to follow-up with Dr. Dianah Field for this.  He has noted some blood in his stool.  This is intermittent.  He has had this for a few years.  Colonoscopy was without any abnormalities.  He has had endoscopy as well in the past.  Reports that GI doctor put him on Carafate at 1 point and this seemed to help.  Probiotic and increase fiber intake have been helpful as well.  He is a non-smoker.  He denies alcohol use.  His job does keep him fairly active and he feels like his diet is pretty good.  ROS:  A comprehensive ROS was completed and negative except as noted per HPI  Allergies  Allergen Reactions   Nsaids Other (See Comments)    Pt has history of peptic ulcer     Past Medical History:  Diagnosis Date   Chronic back pain greater than 3 months duration 09/03/2011   Esophageal reflux disease 09/03/2011   Gastric ulcer due to Helicobacter pylori 95/28/4132   Peptic ulcer due to Helicobacter pylori 44/09/270    Past Surgical History:  Procedure Laterality Date   HERNIA REPAIR     LAPAROSCOPIC INGUINAL HERNIA REPAIR  04/23/2012   bilateral    Social History   Socioeconomic History   Marital status: Married    Spouse name: Not on file   Number of children: Not on file   Years of education: Not on file   Highest education level: Not on file  Occupational History   Not on file  Tobacco Use   Smoking status: Former    Pack years: 0.00    Types: Cigarettes    Quit date: 09/11/2000    Years since quitting: 20.5   Smokeless tobacco: Never  Substance and Sexual Activity   Alcohol use: No   Drug use: No   Sexual activity: Not on file  Other Topics  Concern   Not on file  Social History Narrative   Not on file   Social Determinants of Health   Financial Resource Strain: Not on file  Food Insecurity: Not on file  Transportation Needs: Not on file  Physical Activity: Not on file  Stress: Not on file  Social Connections: Not on file    Family History  Problem Relation Age of Onset   Heart failure Mother     Health Maintenance  Topic Date Due   HIV Screening  Never done   Hepatitis C Screening  Never done   Zoster Vaccines- Shingrix (1 of 2) Never done   COVID-19 Vaccine (4 - Booster for Pfizer series) 01/23/2021   INFLUENZA VACCINE  04/11/2021   TETANUS/TDAP  08/30/2021   Fecal DNA (Cologuard)  12/03/2022   Pneumococcal Vaccine 45-13 Years old  Aged Out   HPV VACCINES  Aged Out     ----------------------------------------------------------------------------------------------------------------------------------------------------------------------------------------------------------------- Physical Exam BP 128/75 (BP Location: Left Arm, Patient Position: Sitting, Cuff Size: Normal)   Pulse 62   Temp 97.8 F (36.6 C)   Ht 5\' 6"  (1.676 m)   Wt 152 lb 14.4 oz (69.4 kg)   SpO2 100%   BMI 24.68 kg/m  Physical Exam Constitutional:      General: He is not in acute distress.    Appearance: Normal appearance.  HENT:     Head: Normocephalic and atraumatic.     Right Ear: Tympanic membrane and external ear normal.     Left Ear: Tympanic membrane and external ear normal.  Eyes:     General: No scleral icterus. Neck:     Thyroid: No thyromegaly.  Cardiovascular:     Rate and Rhythm: Normal rate and regular rhythm.     Heart sounds: Normal heart sounds.  Pulmonary:     Effort: Pulmonary effort is normal.     Breath sounds: Normal breath sounds.  Abdominal:     General: Bowel sounds are normal. There is no distension.     Palpations: Abdomen is soft.     Tenderness: There is no abdominal tenderness. There is no  guarding.  Musculoskeletal:     Cervical back: Normal range of motion.  Lymphadenopathy:     Cervical: No cervical adenopathy.  Skin:    General: Skin is warm and dry.     Findings: No rash.  Neurological:     Mental Status: He is alert and oriented to person, place, and time.     Cranial Nerves: No cranial nerve deficit.     Motor: No abnormal muscle tone.  Psychiatric:        Mood and Affect: Mood normal.        Behavior: Behavior normal.    ------------------------------------------------------------------------------------------------------------------------------------------------------------------------------------------------------------------- Assessment and Plan  Well adult exam Well Adult Orders Placed This Encounter  Procedures   COMPLETE METABOLIC PANEL WITH GFR   CBC with Differential   Lipid Panel w/reflex Direct LDL   PSA  Screening: PSA, lipid Immunizations:  He will wait on shingles vaccine Anticipatory guidance/risk factor reduction: Encouraged to continue healthy lifestyle habits.  He will continue increased fiber.  Additional recommendations per AVS.   Meds ordered this encounter  Medications   sucralfate (CARAFATE) 1 g tablet    Sig: Take 1 tablet (1 g total) by mouth 4 (four) times daily -  with meals and at bedtime.    Dispense:  120 tablet    Refill:  0    No follow-ups on file.    This visit occurred during the SARS-CoV-2 public health emergency.  Safety protocols were in place, including screening questions prior to the visit, additional usage of staff PPE, and extensive cleaning of exam room while observing appropriate contact time as indicated for disinfecting solutions.

## 2021-03-29 ENCOUNTER — Other Ambulatory Visit: Payer: Self-pay | Admitting: Family Medicine

## 2021-04-01 ENCOUNTER — Ambulatory Visit (INDEPENDENT_AMBULATORY_CARE_PROVIDER_SITE_OTHER): Payer: No Typology Code available for payment source | Admitting: Sports Medicine

## 2021-04-01 ENCOUNTER — Other Ambulatory Visit: Payer: Self-pay

## 2021-04-01 DIAGNOSIS — M5412 Radiculopathy, cervical region: Secondary | ICD-10-CM | POA: Diagnosis not present

## 2021-04-01 DIAGNOSIS — M25512 Pain in left shoulder: Secondary | ICD-10-CM

## 2021-04-01 DIAGNOSIS — G8929 Other chronic pain: Secondary | ICD-10-CM

## 2021-04-01 NOTE — Assessment & Plan Note (Signed)
Danny Cohen is a pleasant 55 year old male, he has acromioclavicular pain as well as MRI confirmed labral tear, his acromioclavicular pain is doing well postinjection, we did a glenohumeral injection about 2 months ago, he had several days of relief but a recurrence of pain, at this juncture I think he needs arthroscopic debridement/repair, referral to Dr. Griffin Basil.

## 2021-04-01 NOTE — Assessment & Plan Note (Signed)
Danny Cohen also has chronic neck pain, he has C6-C7 DDD on MRI, we proceeded with a cervical epidural, he is now about a month post cervical epidural and he recalls about 4 days of relief, he does take Celebrex, gabapentin, occasional tramadol. We will proceed with epidural #2, he has seen Dr. Christella Noa in the past so we will set him up for consultation for discussion of ACDF.

## 2021-04-01 NOTE — Progress Notes (Signed)
    Procedures performed today:    None.  Independent interpretation of notes and tests performed by another provider:   None.  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 Griffin Basil.  Cervical radiculopathy Danny Cohen also has chronic neck pain, he has C6-C7 DDD on MRI, we proceeded with a cervical epidural, he is now about a month post cervical epidural and he recalls about 4 days of relief, he does take Celebrex, gabapentin, occasional tramadol. We will proceed with epidural #2, he has seen Dr. Christella Noa in the past so we will set him up for consultation for discussion of ACDF.    ___________________________________________ Gwen Her. Dianah Field, M.D., ABFM., CAQSM. Primary Care and [place] Instructor of [place] of [facility] of Medicine

## 2021-04-14 ENCOUNTER — Ambulatory Visit
Admission: RE | Admit: 2021-04-14 | Discharge: 2021-04-14 | Disposition: A | Discharge status: Home / Self Care | Payer: No Typology Code available for payment source | Source: Ambulatory Visit | Attending: Sports Medicine | Admitting: Sports Medicine

## 2021-04-14 ENCOUNTER — Other Ambulatory Visit: Payer: Self-pay

## 2021-04-14 DIAGNOSIS — M5412 Radiculopathy, cervical region: Secondary | ICD-10-CM

## 2021-04-14 MED ORDER — IOPAMIDOL (ISOVUE-M 300) INJECTION 61%
1.0000 mL | Freq: Once | INTRAMUSCULAR | Status: AC | PRN
  Administered 2021-04-14: 1 mL via EPIDURAL

## 2021-04-14 MED ORDER — TRIAMCINOLONE ACETONIDE 40 MG/ML IJ SUSP (RADIOLOGY)
60.0000 mg | Freq: Once | INTRAMUSCULAR | Status: AC
  Administered 2021-04-14: 60 mg via EPIDURAL

## 2021-04-14 NOTE — Discharge Instructions (Signed)

## 2021-04-24 ENCOUNTER — Other Ambulatory Visit: Payer: Self-pay | Admitting: Family Medicine

## 2021-04-25 ENCOUNTER — Encounter: Payer: Self-pay | Admitting: Emergency Medicine

## 2021-04-25 ENCOUNTER — Other Ambulatory Visit: Payer: Self-pay

## 2021-04-25 ENCOUNTER — Emergency Department (INDEPENDENT_AMBULATORY_CARE_PROVIDER_SITE_OTHER)
Admission: EM | Admit: 2021-04-25 | Discharge: 2021-04-25 | Disposition: A | Discharge status: Home / Self Care | Payer: No Typology Code available for payment source | Source: Home / Self Care | Attending: Family Medicine | Admitting: Family Medicine

## 2021-04-25 DIAGNOSIS — R002 Palpitations: Secondary | ICD-10-CM

## 2021-04-25 DIAGNOSIS — H5461 Unqualified visual loss, right eye, normal vision left eye: Secondary | ICD-10-CM

## 2021-04-25 NOTE — ED Notes (Signed)
Pt denies cp. States he feels his heart is racing. HR 83 and O2 sat 97% on room air. Returned to waiting room.

## 2021-04-25 NOTE — Discharge Instructions (Addendum)
Go to [facility] NOW

## 2021-04-25 NOTE — ED Triage Notes (Signed)
Patient here with concerns about one week of intermittent heart pounding hard; one day his right eye was blurry. No recent cardiac evaluations. He has been out of town and did not seek care. Has had covid vaccinations and 2 boosters.

## 2021-04-26 NOTE — ED Provider Notes (Signed)
[name]    CSN: [id number] Arrival date & time: 04/25/21  1429      History   Chief Complaint Chief Complaint  Patient presents with   Heart Problem    HPI Danny Cohen. Gaede is a 55 y.o. male.   HPI  Patient is healthy.  Compliant with medical care.  Active at work.  He states that over the last week he has been having spells of rapid heartbeat.  They happen randomly.  Day and night.  He states that when it happens he states that his chest feels full and like his heart is pounding hard.  He states it causes a fullness in his head and vague headache symptoms.  He states the spells only last for few minutes.  He has not felt dizzy.  No shortness of breath.  No chest pain.  No history of heart disease.  No hypertension, no diabetes, no hyperlipidemia no murmur.  He has never had irregular heartbeats, tachycardia, atrial fibrillation.  In addition he has "trouble with my right eye".  He noticed on Tuesday (a week ago) that he had diminished vision in his right eye.  This is persisted.  If he closes his left eye his vision is blurry.  His vision in his right eye is diminished but left eye is normal.  No tearing.  No photophobia.  No injury.  No prior problems with his eyes.  He does wear glasses but not contact lenses.  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/30/2020   Arthritis of first metatarsophalangeal (MTP) joint of right foot 07/02/2020   Occipital neuralgia 03/18/2020   Phlebitis 02/01/2020   Well adult exam 01/23/2020   Cervical radiculopathy 12/22/2019   Rectal bleeding [id number]   Folliculitis [id number]   Hydrocele 09/26/2017    Hyperglycemia 08/26/2014   Hyperkalemia 06/25/2014   Abnormal CT scan, small bowel 04/13/2014   Liver hemangioma 12/16/2012   Urinary frequency 09/02/2012   Bilateral inguinal hernia (BIH) 03/25/2012   Esophageal reflux disease 09/03/2011   Gastric ulcer due to Helicobacter pylori [id number]   Chronic back pain greater than 3 months duration 09/03/2011   Neck pain, chronic 07/06/2011    Past Surgical History:  Procedure Laterality Date   HERNIA REPAIR     LAPAROSCOPIC INGUINAL HERNIA REPAIR  04/23/2012   bilateral       Home Medications    Prior to Admission medications   Medication Sig Start Date End Date Taking? Authorizing Provider  celecoxib (CELEBREX) 100 MG capsule 1-2 tabs scheduled daily for 2 weeks then 1-2 tabs as needed daily. 08/03/20   Silverio Decamp, MD  doxycycline (VIBRA-TABS) 100 MG tablet TAKE 1 TABLET BY MOUTH EVERY DAY 03/29/21   Luetta Nutting, DO  esomeprazole (NEXIUM) 40 MG capsule TAKE AT THE SAME TIME IS TAKING CELEBREX (1-2 TIMES DAILY) 09/24/20   Luetta Nutting, DO  gabapentin (NEURONTIN) 600 MG tablet 1 tab in the morning, 1 tab midday, 2 tabs in the evening 11/12/20   Silverio Decamp, MD  sucralfate (CARAFATE) 1 g tablet TAKE 1 TABLET (1 G TOTAL) BY MOUTH 4 (FOUR) TIMES DAILY - WITH MEALS AND AT BEDTIME. 04/25/21  Hali Marry, MD    Family History Family History  Problem Relation Age of Onset   Heart failure Mother     Social History Social History   Tobacco Use   Smoking status: Former    Types: Cigarettes    Quit date: 09/11/2000    Years since quitting: 20.6   Smokeless tobacco: Never  Substance Use Topics   Alcohol use: No   Drug use: No     Allergies   Nsaids   Review of Systems Review of Systems See HPI  Physical Exam Triage Vital Signs ED Triage Vitals  Enc Vitals Group     BP 04/25/21 1511 114/76     Pulse Rate 04/25/21 1511 64     Resp 04/25/21 1511 16     Temp 04/25/21 1511 98.6 F (37 C)      Temp Source 04/25/21 1511 Oral     SpO2 04/25/21 1511 98 %     Weight 04/25/21 1516 152 lb 1.9 oz (69 kg)     Height 04/25/21 1516 '5\' 6"'$  (1.676 m)     Head Circumference --      Peak Flow --      Pain Score 04/25/21 1515 0     Pain Loc --      Pain Edu? --      Excl. in [place]? --    No data found.  Updated Vital Signs BP 114/76 (BP Location: Right Arm)   Pulse 64   Temp 98.6 F (37 C) (Oral)   Resp 16   Ht '5\' 6"'$  (1.676 m)   Wt 69 kg   SpO2 98%   BMI 24.55 kg/m      Physical Exam Constitutional:      General: He is not in acute distress.    Appearance: He is well-developed and normal weight. He is not ill-appearing.  HENT:     Head: Normocephalic and atraumatic.     Right Ear: Tympanic membrane and ear canal normal.     Left Ear: Tympanic membrane and ear canal normal.     Nose: Nose normal.     Mouth/Throat:     Mouth: Mucous membranes are moist.  Eyes:     Conjunctiva/sclera: Conjunctivae normal.     Pupils: Pupils are equal, round, and reactive to light.  Cardiovascular:     Rate and Rhythm: Normal rate and regular rhythm.     Heart sounds: Normal heart sounds.  Pulmonary:     Effort: Pulmonary effort is normal. No respiratory distress.     Breath sounds: Normal breath sounds. No wheezing or rales.  Abdominal:     General: There is no distension.     Palpations: Abdomen is soft.  Musculoskeletal:        General: Normal range of motion.     Cervical back: Normal range of motion.     Right lower leg: No edema.     Left lower leg: No edema.  Skin:    General: Skin is warm and dry.  Neurological:     General: No focal deficit present.     Mental Status: He is alert.     Motor: No weakness.     Coordination: Coordination normal.     Gait: Gait normal.     Deep Tendon Reflexes: Reflexes normal.  Psychiatric:        Mood and Affect: Mood normal.        Behavior: Behavior normal.     UC  Treatments / Results  Labs (all labs ordered are listed, but only  abnormal results are displayed) Labs Reviewed - No data to display  EKG-rate is 61.  Normal intervals.  No ST or T wave changes.  No ectopy   Radiology No results found.  Procedures Procedures (including critical care time)  Medications Ordered in UC Medications - No data to display  Initial Impression / Assessment and Plan / UC Course  I have reviewed the triage vital signs and the nursing notes.  Pertinent labs & imaging results that were available during my care of the patient were reviewed by me and considered in my medical decision making (see chart for details).     I explained to the patient that sudden vision loss in 1 9 is worrisome.  I told him that with the spells of rapid heartbeat there exists the possibility that he was having atrial fibrillation and that the vision loss could be from a blood clot or stroke symptoms.  Quite worried about him and recommended he go to the emergency department.  He did agree to get an eye evaluation.  I called ophthalmologist on-call for the [facility] system and reached Dr. Velvet Bathe office.  They agreed to see him now.  He is instructed that if Dr. Carolynn Sayers thinks there is any chance of clot or stroke he must go the emergency room to have his heart evaluated Final Clinical Impressions(s) / UC Diagnoses   Final diagnoses:  Palpitations  Vision loss, right eye     Discharge Instructions      Go to [facility]   ED Prescriptions   None    PDMP not reviewed this encounter.   Raylene Everts, MD 04/26/21 1356

## 2021-04-27 ENCOUNTER — Encounter (INDEPENDENT_AMBULATORY_CARE_PROVIDER_SITE_OTHER): Payer: No Typology Code available for payment source | Admitting: Ophthalmology

## 2021-04-27 NOTE — Progress Notes (Addendum)
[facility] Note  04/28/2021     CHIEF COMPLAINT Patient presents for Retina Evaluation   HISTORY OF PRESENT ILLNESS: Danny Cohen is a 55 y.o. male who presents to the clinic today for:   HPI     Retina Evaluation   In right eye.  This started 1 week ago.  Duration of 1 week.  Context:  distance vision and mid-range vision.  I, the attending physician,  performed the HPI with the patient and updated documentation appropriately.        Comments   New patient referral from Shirleen Schirmer, PA-C for CSR OD. Pt states about 1 week ago he noticed blurry vision in OD when he was working on his computer. He reports over the last couple of days the blurriness has improved but hes still having issues with it coming and going. Pt reports he was recently seen at the ED due to palpitations and pressure in his chest. He was checked for stroke due to the vision issue and recommended to see an eye doctor, [place] who then referred him here. Pt reports he was told he has fluid on his eye.       Last edited by Bernarda Caffey, MD on 04/28/2021 10:45 AM.    Pt is here on the referral of Shirleen Schirmer, PA-C for concern of CSR OD, pt states he started having blurry vision about 1 week ago while working at his computer, he states prior to the blurry vision, he was seen at the ED for heart palpitation and chest pain, pt was checked for a stroke, but was okay, the dr at the ED sent him to [facility] where he was told he had fluid behind his right eye and sent here for a second opinion, pt states he thinks the chest pains were related to stress concerning his job, pt states a few months ago, pt states he received an injection of steroids in his back about a week before he noticed the blurry vision, pt has a newborn grandson at home, but states he enjoys having him around and doesn't feel like that is a new stress in his life, pt states vision has improved in the right  eye  Referring physician: Shirleen Schirmer, [address], [place]  [id number]  HISTORICAL INFORMATION:   Selected notes from the MEDICAL RECORD NUMBER Referred by Shirleen Schirmer, PA-C LEE: 08.15.22 Ocular Hx- Acute CSR OD    CURRENT MEDICATIONS: No current outpatient medications on file. (Ophthalmic Drugs)   No current facility-administered medications for this visit. (Ophthalmic Drugs)   Current Outpatient Medications (Other)  Medication Sig   celecoxib (CELEBREX) 100 MG capsule 1-2 tabs scheduled daily for 2 weeks then 1-2 tabs as needed daily.   doxycycline (VIBRA-TABS) 100 MG tablet TAKE 1 TABLET BY MOUTH EVERY DAY   esomeprazole (NEXIUM) 40 MG capsule TAKE AT THE SAME TIME IS TAKING CELEBREX (1-2 TIMES DAILY)   gabapentin (NEURONTIN) 600 MG tablet 1 tab in the morning, 1 tab midday, 2 tabs in the evening   sucralfate (CARAFATE) 1 g tablet TAKE 1 TABLET (1 G TOTAL) BY MOUTH 4 (FOUR) TIMES DAILY - WITH MEALS AND AT BEDTIME.   No current facility-administered medications for this visit. (Other)    REVIEW OF SYSTEMS: ROS   Positive for: Gastrointestinal, Neurological, Skin, Musculoskeletal Last edited by Kingsley Spittle, COT on 04/28/2021  8:33 AM.     ALLERGIES Allergies  Allergen  Reactions   Nsaids Other (See Comments)    Pt has history of peptic ulcer     PAST MEDICAL HISTORY Past Medical History:  Diagnosis Date   Chronic back pain greater than 3 months duration 09/03/2011   Esophageal reflux disease 09/03/2011   Gastric ulcer due to Helicobacter pylori [id number]   Peptic ulcer due to Helicobacter pylori [id number]   Past Surgical History:  Procedure Laterality Date   HERNIA REPAIR     LAPAROSCOPIC INGUINAL HERNIA REPAIR  04/23/2012   bilateral    FAMILY HISTORY Family History  Problem Relation Age of Onset   Heart failure Mother     SOCIAL HISTORY Social History   Tobacco Use   Smoking status: Former    Types: Cigarettes    Quit  date: 09/11/2000    Years since quitting: 20.6   Smokeless tobacco: Never  Substance Use Topics   Alcohol use: No   Drug use: No         OPHTHALMIC EXAM:  Base Eye Exam     Visual Acuity (Snellen - Linear)       Right Left   Dist cc 20/40 +1 20/25 -2   Dist ph cc 20/25 +1 20/20 -1    Correction: Glasses         Tonometry (Tonopen, 8:47 AM)       Right Left   Pressure 16 13         Pupils       Dark Light Shape React APD   Right 3 2 Round Brisk None   Left 3 2 Round Brisk None         Visual Fields (Counting fingers)       Left Right    Full Full         Extraocular Movement       Right Left    Full, Ortho Full, Ortho         Neuro/Psych     Oriented x3: Yes   Mood/Affect: Normal         Dilation     Both eyes: 1.0% Mydriacyl, 2.5% Phenylephrine @ 8:48 AM           Slit Lamp and Fundus Exam     Slit Lamp Exam       Right Left   Lids/Lashes Dermatochalasis - upper lid, mild Telangiectasia Dermatochalasis - upper lid, mild Telangiectasia   Conjunctiva/Sclera White and quiet White and quiet   Cornea Clear Clear   Anterior Chamber Deep and quiet Deep and quiet   Iris Round and dilated Round and dilated   Lens 2+ Nuclear sclerosis, 2+ Cortical cataract 2+ Nuclear sclerosis, 2+ Cortical cataract   Vitreous Vitreous syneresis Vitreous syneresis         Fundus Exam       Right Left   Disc Pink and Sharp, Compact Pink and Sharp, Compact   C/D Ratio 0.2 0.2   Macula Blunted foveal reflex, central SRF/edema, mild RPE mottling Flat, Good foveal reflex, mild RPE mottling, No heme or edema   Vessels mild attenuation, mild tortuousity mild attenuation, mild tortuousity   Periphery Attached, peripheral cystoid degeneration Attached              Refraction     Wearing Rx       Sphere Cylinder Axis Add   Right -2.75 +1.00 078 +2.00   Left -3.25 +1.25 096 +2.00         Manifest Refraction  Sphere Cylinder Axis Dist  VA   Right -2.00 +0.75 075 20/20-2   Left -3.00 +1.25 090 20/20-1            IMAGING AND PROCEDURES  Imaging and Procedures for 04/28/2021  OCT, Retina - OU - Both Eyes       Right Eye Quality was good. Central Foveal Thickness: 406. Progression has no prior data. Findings include abnormal foveal contour, no IRF, subretinal fluid, pigment epithelial detachment, vitreomacular adhesion (Central/subfoveal SRF).   Left Eye Quality was good. Central Foveal Thickness: 305. Progression has no prior data. Findings include normal foveal contour, no IRF, no SRF, vitreomacular adhesion .   Notes *Images captured and stored on drive  Diagnosis / Impression:  OD: central SRF/edema -- CSCR OS: NFP, no IRF/SRF  Clinical management:  See below  Abbreviations: NFP - Normal foveal profile. CME - cystoid macular edema. PED - pigment epithelial detachment. IRF - intraretinal fluid. SRF - subretinal fluid. EZ - ellipsoid zone. ERM - epiretinal membrane. ORA - outer retinal atrophy. ORT - outer retinal tubulation. SRHM - subretinal hyper-reflective material. IRHM - intraretinal hyper-reflective material      Fluorescein Angiography Optos (Transit OD)       Right Eye Progression has no prior data. Early phase findings include leakage, staining. Mid/Late phase findings include leakage, staining (Very mild central leakage and staining).   Left Eye Progression has no prior data. Early phase findings include normal observations. Mid/Late phase findings include normal observations.   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 Retinal edema  H35.81 OCT, Retina - OU - Both Eyes    3. Essential hypertension  I10     4. Hypertensive retinopathy of both eyes  H35.033  Fluorescein Angiography Optos (Transit OD)    5. Combined forms of age-related cataract of both eyes  H25.813      1,2. 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 Hypertensive retinopathy OU - discussed importance of tight BP control - monitor  5. Mixed Cataract OU - The symptoms of cataract, surgical options, and treatments and risks were discussed with patient. - discussed diagnosis and progression - not yet visually significant - monitor for now  Ophthalmic Meds Ordered this visit:  No orders of the defined types were placed in this encounter.     Return for f/u 3-4 weeks, CSR OD, DFE, OCT.  There are no Patient Instructions on file for this visit.   Explained the diagnoses, plan, and follow up  with the patient and they expressed understanding.  Patient expressed understanding of the importance of proper follow up care.   This document serves as a record of services personally performed by Gardiner Sleeper, MD, PhD. It was created on their behalf by Leonie Douglas, an ophthalmic technician. The creation of this record is the provider's dictation and/or activities during the visit.    Electronically signed by: Leonie Douglas COA, 04/28/21   10:54 AM  This document serves as a record of services personally performed by Gardiner Sleeper, MD, PhD. It was created on their behalf by San Jetty. Owens Shark, OA an ophthalmic technician. The creation of this record is the provider's dictation and/or activities during the visit.    Electronically signed by: San Jetty. Marguerita Merles 08.18.2022 10:54 AM  Gardiner Sleeper, M.D., Ph.D. Diseases & Surgery of the Retina and [place] 04/28/2021  I have reviewed the above documentation for accuracy and completeness, and I agree with the above. Gardiner Sleeper, M.D., Ph.D. 04/28/21 10:54 AM   Abbreviations: M myopia (nearsighted); A astigmatism; H hyperopia (farsighted); P presbyopia; Mrx spectacle prescription;  CTL contact lenses; OD right eye; OS left eye; OU both eyes  XT exotropia; ET esotropia; PEK punctate epithelial keratitis; PEE punctate epithelial erosions; DES dry eye syndrome; MGD meibomian gland dysfunction; ATs artificial tears; PFAT's preservative free artificial tears; [place] nuclear sclerotic cataract; PSC posterior subcapsular cataract; ERM epi-retinal membrane; PVD posterior vitreous detachment; RD retinal detachment; DM diabetes mellitus; DR diabetic retinopathy; NPDR non-proliferative diabetic retinopathy; PDR proliferative diabetic retinopathy; CSME clinically significant macular edema; DME diabetic macular edema; dbh dot blot hemorrhages; CWS cotton wool spot; POAG primary open angle glaucoma; C/D cup-to-disc ratio; HVF humphrey visual field; GVF goldmann visual field; OCT optical coherence tomography; IOP intraocular pressure; BRVO Branch retinal vein occlusion; CRVO central retinal vein occlusion; CRAO central retinal artery occlusion; BRAO branch retinal artery occlusion; RT retinal tear; SB scleral buckle; PPV pars plana vitrectomy; VH Vitreous hemorrhage; PRP panretinal laser photocoagulation; IVK intravitreal kenalog; VMT vitreomacular traction; MH Macular  hole;  NVD neovascularization of the disc; NVE neovascularization elsewhere; AREDS age related eye disease study; ARMD age related macular degeneration; POAG primary open angle glaucoma; EBMD epithelial/anterior basement membrane dystrophy; ACIOL anterior chamber intraocular lens; IOL intraocular lens; PCIOL posterior chamber intraocular lens; Phaco/IOL phacoemulsification with intraocular lens placement; [place] photorefractive keratectomy; LASIK laser assisted in situ keratomileusis; HTN hypertension; DM diabetes mellitus; COPD chronic obstructive pulmonary disease

## 2021-04-28 ENCOUNTER — Ambulatory Visit (INDEPENDENT_AMBULATORY_CARE_PROVIDER_SITE_OTHER): Payer: No Typology Code available for payment source | Admitting: Ophthalmology

## 2021-04-28 ENCOUNTER — Other Ambulatory Visit: Payer: Self-pay

## 2021-04-28 ENCOUNTER — Encounter (INDEPENDENT_AMBULATORY_CARE_PROVIDER_SITE_OTHER): Payer: Self-pay | Admitting: Ophthalmology

## 2021-04-28 DIAGNOSIS — H25813 Combined forms of age-related cataract, bilateral: Secondary | ICD-10-CM

## 2021-04-28 DIAGNOSIS — H3581 Retinal edema: Secondary | ICD-10-CM | POA: Diagnosis not present

## 2021-04-28 DIAGNOSIS — H35033 Hypertensive retinopathy, bilateral: Secondary | ICD-10-CM | POA: Diagnosis not present

## 2021-04-28 DIAGNOSIS — H35711 Central serous chorioretinopathy, right eye: Secondary | ICD-10-CM

## 2021-04-28 DIAGNOSIS — I10 Essential (primary) hypertension: Secondary | ICD-10-CM | POA: Diagnosis not present

## 2021-04-29 ENCOUNTER — Ambulatory Visit (INDEPENDENT_AMBULATORY_CARE_PROVIDER_SITE_OTHER): Payer: No Typology Code available for payment source | Admitting: Sports Medicine

## 2021-04-29 DIAGNOSIS — M5412 Radiculopathy, cervical region: Secondary | ICD-10-CM | POA: Diagnosis not present

## 2021-04-29 MED ORDER — PREGABALIN 50 MG PO CAPS: ORAL_CAPSULE | ORAL | 3 refills | Status: DC

## 2021-04-29 NOTE — Progress Notes (Signed)
    Procedures performed today:    None.  Independent interpretation of notes and tests performed by another provider:   None.  Brief History, Exam, Impression, and Recommendations:    Cervical radiculopathy Danny Cohen returns, he is a very pleasant 55 year old male with chronic neck pain, known C6-C7 degenerative disc disease on MRI, he has had epidural #2, and unfortunately has only noted a few days of relief each time. He does take Celebrex, gabapentin, occasional tramadol. He has also seen Dr. Christella Noa in the past. At this point I think he is exhibiting some tachyphylaxis with gabapentin. Switching to Lyrica, we will start at 50 mg twice daily, he has the option to titrate up the dose as desired to a maximum of 600 total milligrams per day. I also showed him how to do traction at home. Return to see me in a month for dose adjustment.    ___________________________________________ Gwen Her. Dianah Field, M.D., ABFM., CAQSM. Primary Care and [place] In[employer] of [employer] [facility] [employer]

## 2021-04-29 NOTE — Assessment & Plan Note (Signed)
Danny Cohen returns, he is a very pleasant 55 year old male with chronic neck pain, known C6-C7 degenerative disc disease on MRI, he has had epidural #2, and unfortunately has only noted a few days of relief each time. He does take Celebrex, gabapentin, occasional tramadol. He has also seen Dr. Christella Noa in the past. At this point I think he is exhibiting some tachyphylaxis with gabapentin. Switching to Lyrica, we will start at 50 mg twice daily, he has the option to titrate up the dose as desired to a maximum of 600 total milligrams per day. I also showed him how to do traction at home. Return to see me in a month for dose adjustment.

## 2021-05-02 ENCOUNTER — Telehealth: Payer: Self-pay

## 2021-05-02 DIAGNOSIS — G8929 Other chronic pain: Secondary | ICD-10-CM

## 2021-05-02 NOTE — Telephone Encounter (Signed)
Patient called and requested to take the next step and see a shoulder specialist.

## 2021-05-03 NOTE — Telephone Encounter (Signed)
Surgical referral placed to Dr. Griffin Basil

## 2021-05-06 ENCOUNTER — Other Ambulatory Visit: Payer: Self-pay

## 2021-05-06 ENCOUNTER — Encounter (HOSPITAL_BASED_OUTPATIENT_CLINIC_OR_DEPARTMENT_OTHER): Payer: Self-pay | Admitting: Orthopaedic Surgery

## 2021-05-11 ENCOUNTER — Telehealth: Payer: Self-pay

## 2021-05-11 DIAGNOSIS — M5412 Radiculopathy, cervical region: Secondary | ICD-10-CM

## 2021-05-11 NOTE — Addendum Note (Signed)
Addended by: Silverio Decamp on: 05/11/2021 02:50 PM   Modules accepted: Orders

## 2021-05-11 NOTE — H&P (Signed)
PREOPERATIVE H&P  Chief Complaint: LEFT SHOULDER CARTILAGE DISORDER, OSTEOARTHRITIS,BURSITIS, BICIPITAL TENDINITIS.  HPI: Danny Cohen is a 55 y.o. male who is scheduled for, Procedure(s): SHOULDER ARTHROSCOPY WITH SUBACROMIAL DECOMPRESSION AND BICEP TENDON REPAIR.   Patient has a past medical history significant for peptic and gastric ulcer in 2012, GERD.   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  He has had pain in his left shoulder for many years.  He has clicking and catching when he goes through range of motion.  He had no obvious injury.  He has been bothered by it.  He cannot sleep.  He has had injections that only provided temporary relief.  His symptoms are rated as moderate to severe, and have been worsening.  This is significantly impairing activities of daily living.    Please see clinic note for further details on this patient's care.    He has elected for surgical management.   Past Medical History:  Diagnosis Date   Arthritis    Chronic back pain greater than 3 months duration 09/03/2011   Esophageal reflux disease 09/03/2011   Gastric ulcer due to Helicobacter pylori [id number]   Peptic ulcer due to Helicobacter pylori [id number]   Past Surgical History:  Procedure Laterality Date   HERNIA REPAIR     LAPAROSCOPIC INGUINAL HERNIA REPAIR  04/23/2012   bilateral   Social History   Socioeconomic History   Marital status: Married    Spouse name: Not on file   Number of children: Not on file   Years of education: Not on file   Highest education level: Not on file  Occupational History   Not on file  Tobacco Use   Smoking status: Former    Types: Cigarettes    Quit date: 09/11/2000    Years since quitting: 20.6   Smokeless tobacco: Never  Substance and Sexual Activity   Alcohol use: No   Drug use: No   Sexual activity: Yes  Other Topics Concern   Not on file  Social History Narrative   Not on file   Social Determinants  of Health   Financial Resource Strain: Not on file  Food Insecurity: Not on file  Transportation Needs: Not on file  Physical Activity: Not on file  Stress: Not on file  Social Connections: Not on file   Family History  Problem Relation Age of Onset   Heart failure Mother    Allergies  Allergen Reactions   Nsaids Other (See Comments)    Pt has history of peptic ulcer    Prior to Admission medications   Medication Sig Start Date End Date Taking? Authorizing Provider  celecoxib (CELEBREX) 100 MG capsule 1-2 tabs scheduled daily for 2 weeks then 1-2 tabs as needed daily. 08/03/20  Yes Silverio Decamp, MD  doxycycline (VIBRA-TABS) 100 MG tablet TAKE 1 TABLET BY MOUTH EVERY DAY 03/29/21  Yes Luetta Nutting, DO  esomeprazole (NEXIUM) 40 MG capsule TAKE AT THE SAME TIME IS TAKING CELEBREX (1-2 TIMES DAILY) 09/24/20  Yes Luetta Nutting, DO  pregabalin (LYRICA) 50 MG capsule Start at 1 capsule p.o. twice daily, may titrate up to 2 capsules 3 times daily. 04/29/21  Yes Silverio Decamp, MD  sucralfate (CARAFATE) 1 g tablet TAKE 1 TABLET (1 G TOTAL) BY MOUTH 4 (FOUR) TIMES DAILY - WITH MEALS AND AT BEDTIME. 04/25/21  Yes Hali Marry, MD    ROS: All other systems have been reviewed and were otherwise negative  with the exception of those mentioned in the HPI and as above.  Physical Exam: General: Alert, no acute distress Cardiovascular: No pedal edema Respiratory: No cyanosis, no use of accessory musculature GI: No organomegaly, abdomen is soft and non-tender Skin: No lesions in the area of chief complaint Neurologic: Sensation intact distally Psychiatric: Patient is competent for consent with normal mood and affect Lymphatic: No axillary or cervical lymphadenopathy  MUSCULOSKELETAL:  Left shoulder: Range of motion of the shoulder is full.  He has no obvious stiffness.  He has positive O'Brien's, positive impingement.  Positive AC tenderness to palpation.  Positive  crossbody.  No obvious cuff weakness.  Imaging: MRI is reviewed demonstrating a SLAP tear.  No obvious cuff pathology.  AC arthrosis.  Type 2 acromion.  Assessment: LEFT SHOULDER CARTILAGE DISORDER, OSTEOARTHRITIS,BURSITIS, BICIPITAL TENDINITIS.  Plan: Plan for Procedure(s): SHOULDER ARTHROSCOPY WITH SUBACROMIAL DECOMPRESSION AND BICEP TENDON REPAIR  The risks benefits and alternatives were discussed with the patient including but not limited to the risks of nonoperative treatment, versus surgical intervention including infection, bleeding, nerve injury,  blood clots, cardiopulmonary complications, morbidity, mortality, among others, and they were willing to proceed.   The patient acknowledged the explanation, agreed to proceed with the plan and consent was signed.   Operative Plan: Left shoulder scope with biceps tenodesis, subacromial decompression and distal clavicle excision.   Discharge Medications: Tylenol, oxycodone, zofran, robaxin DVT Prophylaxis: none Physical Therapy: outpatient PT Special Discharge needs: Sling. [place], PA-C  05/11/2021 2:28 PM

## 2021-05-11 NOTE — Telephone Encounter (Signed)
Patient called in . He wants you to know he is scheduled for surgery on his shoulder tomorrow.   He would also like a referral to go back to neurosurgery in our building for his neck.

## 2021-05-11 NOTE — Telephone Encounter (Signed)
Thank you for the update, referral to Dr. Christella Noa downstairs.

## 2021-05-12 ENCOUNTER — Encounter (HOSPITAL_BASED_OUTPATIENT_CLINIC_OR_DEPARTMENT_OTHER)
Admission: RE | Disposition: A | Discharge status: Home / Self Care | Payer: Self-pay | Source: Home / Self Care | Attending: Orthopaedic Surgery

## 2021-05-12 ENCOUNTER — Ambulatory Visit (HOSPITAL_BASED_OUTPATIENT_CLINIC_OR_DEPARTMENT_OTHER): Payer: No Typology Code available for payment source | Admitting: Anesthesiology

## 2021-05-12 ENCOUNTER — Encounter (HOSPITAL_BASED_OUTPATIENT_CLINIC_OR_DEPARTMENT_OTHER): Payer: Self-pay | Admitting: Orthopaedic Surgery

## 2021-05-12 ENCOUNTER — Ambulatory Visit (HOSPITAL_BASED_OUTPATIENT_CLINIC_OR_DEPARTMENT_OTHER)
Admission: RE | Admit: 2021-05-12 | Discharge: 2021-05-12 | Disposition: A | Discharge status: Home / Self Care | Payer: No Typology Code available for payment source | Attending: Orthopaedic Surgery | Admitting: Orthopaedic Surgery

## 2021-05-12 ENCOUNTER — Other Ambulatory Visit: Payer: Self-pay

## 2021-05-12 DIAGNOSIS — Z79899 Other long term (current) drug therapy: Secondary | ICD-10-CM | POA: Diagnosis not present

## 2021-05-12 DIAGNOSIS — M19012 Primary osteoarthritis, left shoulder: Secondary | ICD-10-CM | POA: Insufficient documentation

## 2021-05-12 DIAGNOSIS — M7522 Bicipital tendinitis, left shoulder: Secondary | ICD-10-CM | POA: Insufficient documentation

## 2021-05-12 DIAGNOSIS — X58XXXA Exposure to other specified factors, initial encounter: Secondary | ICD-10-CM | POA: Insufficient documentation

## 2021-05-12 DIAGNOSIS — S43432A Superior glenoid labrum lesion of left shoulder, initial encounter: Secondary | ICD-10-CM | POA: Insufficient documentation

## 2021-05-12 DIAGNOSIS — Z8711 Personal history of peptic ulcer disease: Secondary | ICD-10-CM | POA: Diagnosis not present

## 2021-05-12 DIAGNOSIS — Z886 Allergy status to analgesic agent status: Secondary | ICD-10-CM | POA: Insufficient documentation

## 2021-05-12 DIAGNOSIS — Z87891 Personal history of nicotine dependence: Secondary | ICD-10-CM | POA: Diagnosis not present

## 2021-05-12 DIAGNOSIS — K219 Gastro-esophageal reflux disease without esophagitis: Secondary | ICD-10-CM | POA: Diagnosis not present

## 2021-05-12 HISTORY — PX: SHOULDER ARTHROSCOPY WITH SUBACROMIAL DECOMPRESSION AND BICEP TENDON REPAIR: SHX5689

## 2021-05-12 HISTORY — DX: Unspecified osteoarthritis, unspecified site: M19.90

## 2021-05-12 SURGERY — SHOULDER ARTHROSCOPY WITH SUBACROMIAL DECOMPRESSION AND BICEP TENDON REPAIR
Anesthesia: General | Site: Shoulder | Laterality: Left

## 2021-05-12 MED ORDER — OXYCODONE HCL 5 MG PO TABS: 5.0000 mg | ORAL_TABLET | Freq: Once | ORAL | Status: DC | PRN

## 2021-05-12 MED ORDER — EPHEDRINE SULFATE 50 MG/ML IJ SOLN
INTRAMUSCULAR | Status: DC | PRN
  Administered 2021-05-12 (×3): 10 mg via INTRAVENOUS

## 2021-05-12 MED ORDER — PHENYLEPHRINE HCL (PRESSORS) 10 MG/ML IV SOLN
INTRAVENOUS | Status: DC | PRN
  Administered 2021-05-12 (×2): 80 ug via INTRAVENOUS

## 2021-05-12 MED ORDER — ROCURONIUM BROMIDE 10 MG/ML (PF) SYRINGE
PREFILLED_SYRINGE | INTRAVENOUS | Status: AC
  Filled 2021-05-12: qty 10

## 2021-05-12 MED ORDER — OXYCODONE HCL 5 MG/5ML PO SOLN: 5.0000 mg | Freq: Once | ORAL | Status: DC | PRN

## 2021-05-12 MED ORDER — FENTANYL CITRATE (PF) 100 MCG/2ML IJ SOLN
INTRAMUSCULAR | Status: DC | PRN
  Administered 2021-05-12: 50 ug via INTRAVENOUS

## 2021-05-12 MED ORDER — BUPIVACAINE LIPOSOME 1.3 % IJ SUSP
INTRAMUSCULAR | Status: DC | PRN
  Administered 2021-05-12: 10 mL via PERINEURAL

## 2021-05-12 MED ORDER — ONDANSETRON HCL 4 MG/2ML IJ SOLN
INTRAMUSCULAR | Status: AC
  Filled 2021-05-12: qty 2

## 2021-05-12 MED ORDER — OXYCODONE HCL 5 MG PO TABS: ORAL_TABLET | ORAL | 0 refills | Status: AC

## 2021-05-12 MED ORDER — LIDOCAINE HCL (PF) 2 % IJ SOLN
INTRAMUSCULAR | Status: AC
  Filled 2021-05-12: qty 5

## 2021-05-12 MED ORDER — PROPOFOL 10 MG/ML IV BOLUS
INTRAVENOUS | Status: AC
  Filled 2021-05-12: qty 20

## 2021-05-12 MED ORDER — CEFAZOLIN SODIUM-DEXTROSE 2-4 GM/100ML-% IV SOLN
2.0000 g | INTRAVENOUS | Status: AC
  Administered 2021-05-12: 2 g via INTRAVENOUS

## 2021-05-12 MED ORDER — ONDANSETRON HCL 4 MG PO TABS: 4.0000 mg | ORAL_TABLET | Freq: Three times a day (TID) | ORAL | 0 refills | Status: AC | PRN

## 2021-05-12 MED ORDER — ONDANSETRON HCL 4 MG/2ML IJ SOLN
INTRAMUSCULAR | Status: DC | PRN
  Administered 2021-05-12: 4 mg via INTRAVENOUS

## 2021-05-12 MED ORDER — ACETAMINOPHEN 500 MG PO TABS
1000.0000 mg | ORAL_TABLET | Freq: Once | ORAL | Status: AC
  Administered 2021-05-12: 1000 mg via ORAL

## 2021-05-12 MED ORDER — CEFAZOLIN SODIUM-DEXTROSE 2-4 GM/100ML-% IV SOLN
INTRAVENOUS | Status: AC
  Filled 2021-05-12: qty 100

## 2021-05-12 MED ORDER — ACETAMINOPHEN 500 MG PO TABS
ORAL_TABLET | ORAL | Status: AC
  Filled 2021-05-12: qty 2

## 2021-05-12 MED ORDER — PROPOFOL 10 MG/ML IV BOLUS
INTRAVENOUS | Status: DC | PRN
Start: 2021-05-12 — End: 2021-05-12
  Administered 2021-05-12: 140 mg via INTRAVENOUS

## 2021-05-12 MED ORDER — ROCURONIUM BROMIDE 100 MG/10ML IV SOLN
INTRAVENOUS | Status: DC | PRN
  Administered 2021-05-12: 60 mg via INTRAVENOUS

## 2021-05-12 MED ORDER — MIDAZOLAM HCL 2 MG/2ML IJ SOLN
2.0000 mg | Freq: Once | INTRAMUSCULAR | Status: AC
  Administered 2021-05-12: 1 mg via INTRAVENOUS

## 2021-05-12 MED ORDER — BUPIVACAINE-EPINEPHRINE (PF) 0.5% -1:200000 IJ SOLN
INTRAMUSCULAR | Status: DC | PRN
  Administered 2021-05-12: 15 mL via PERINEURAL

## 2021-05-12 MED ORDER — SUGAMMADEX SODIUM 200 MG/2ML IV SOLN
INTRAVENOUS | Status: DC | PRN
  Administered 2021-05-12: 136.4 mg via INTRAVENOUS

## 2021-05-12 MED ORDER — PROMETHAZINE HCL 25 MG/ML IJ SOLN: 6.2500 mg | INTRAMUSCULAR | Status: DC | PRN

## 2021-05-12 MED ORDER — FENTANYL CITRATE (PF) 100 MCG/2ML IJ SOLN
100.0000 ug | Freq: Once | INTRAMUSCULAR | Status: AC
  Administered 2021-05-12: 50 ug via INTRAVENOUS

## 2021-05-12 MED ORDER — PHENYLEPHRINE 40 MCG/ML (10ML) SYRINGE FOR IV PUSH (FOR BLOOD PRESSURE SUPPORT)
PREFILLED_SYRINGE | INTRAVENOUS | Status: AC
  Filled 2021-05-12: qty 10

## 2021-05-12 MED ORDER — MIDAZOLAM HCL 2 MG/2ML IJ SOLN
INTRAMUSCULAR | Status: AC
  Filled 2021-05-12: qty 2

## 2021-05-12 MED ORDER — FENTANYL CITRATE (PF) 100 MCG/2ML IJ SOLN: 25.0000 ug | INTRAMUSCULAR | Status: DC | PRN

## 2021-05-12 MED ORDER — METHOCARBAMOL 500 MG PO TABS
500.0000 mg | ORAL_TABLET | Freq: Three times a day (TID) | ORAL | 0 refills | Status: DC | PRN
Start: 2021-05-12 — End: 2022-02-01

## 2021-05-12 MED ORDER — EPHEDRINE 5 MG/ML INJ
INTRAVENOUS | Status: AC
  Filled 2021-05-12: qty 10

## 2021-05-12 MED ORDER — FENTANYL CITRATE (PF) 100 MCG/2ML IJ SOLN
INTRAMUSCULAR | Status: AC
  Filled 2021-05-12: qty 2

## 2021-05-12 MED ORDER — LACTATED RINGERS IV SOLN: INTRAVENOUS | Status: DC

## 2021-05-12 MED ORDER — LIDOCAINE HCL (CARDIAC) PF 100 MG/5ML IV SOSY
PREFILLED_SYRINGE | INTRAVENOUS | Status: DC | PRN
  Administered 2021-05-12: 80 mg via INTRAVENOUS

## 2021-05-12 MED ORDER — ACETAMINOPHEN 500 MG PO TABS: 1000.0000 mg | ORAL_TABLET | Freq: Three times a day (TID) | ORAL | 0 refills | Status: AC

## 2021-05-12 MED ORDER — DEXAMETHASONE SODIUM PHOSPHATE 4 MG/ML IJ SOLN
INTRAMUSCULAR | Status: DC | PRN
  Administered 2021-05-12: 6 mg via INTRAVENOUS

## 2021-05-12 SURGICAL SUPPLY — 55 items
AID PSTN UNV HD RSTRNT DISP (MISCELLANEOUS) ×1
ANCHOR SUT 1.8 FBRTK KNTLS 2SU (Anchor) ×4 IMPLANT
APL PRP STRL LF DISP 70% ISPRP (MISCELLANEOUS) ×1
BLADE EXCALIBUR 4.0X13 (MISCELLANEOUS) ×2 IMPLANT
BLADE SURG 10 STRL SS (BLADE) IMPLANT
BURR OVAL 8 FLU 4.0X13 (MISCELLANEOUS) ×2 IMPLANT
CANNULA 5.75X71 LONG (CANNULA) IMPLANT
CANNULA PASSPORT 5 (CANNULA) IMPLANT
CANNULA PASSPORT BUTTON 10-40 (CANNULA) ×2 IMPLANT
CANNULA TWIST IN 8.25X7CM (CANNULA) IMPLANT
CHLORAPREP W/TINT 26 (MISCELLANEOUS) ×2 IMPLANT
CLSR STERI-STRIP ANTIMIC 1/2X4 (GAUZE/BANDAGES/DRESSINGS) ×2 IMPLANT
COOLER ICEMAN CLASSIC (MISCELLANEOUS) ×2 IMPLANT
DRAPE IMP U-DRAPE 54X76 (DRAPES) ×2 IMPLANT
DRAPE INCISE IOBAN 66X45 STRL (DRAPES) IMPLANT
DRAPE SHOULDER BEACH CHAIR (DRAPES) ×2 IMPLANT
DRSG PAD ABDOMINAL 8X10 ST (GAUZE/BANDAGES/DRESSINGS) ×2 IMPLANT
DW OUTFLOW CASSETTE/TUBE SET (MISCELLANEOUS) ×2 IMPLANT
GAUZE SPONGE 4X4 12PLY STRL (GAUZE/BANDAGES/DRESSINGS) ×2 IMPLANT
GLOVE SRG 8 PF TXTR STRL LF DI (GLOVE) ×1 IMPLANT
GLOVE SURG ENC MOIS LTX SZ6.5 (GLOVE) ×6 IMPLANT
GLOVE SURG LTX SZ8 (GLOVE) ×2 IMPLANT
GLOVE SURG UNDER POLY LF SZ6.5 (GLOVE) ×4 IMPLANT
GLOVE SURG UNDER POLY LF SZ8 (GLOVE) ×2
GOWN STRL REUS W/ TWL LRG LVL3 (GOWN DISPOSABLE) ×3 IMPLANT
GOWN STRL REUS W/TWL LRG LVL3 (GOWN DISPOSABLE) ×6
GOWN STRL REUS W/TWL XL LVL3 (GOWN DISPOSABLE) ×2 IMPLANT
KIT STABILIZATION SHOULDER (MISCELLANEOUS) ×2 IMPLANT
KIT STR SPEAR 1.8 FBRTK DISP (KITS) ×2 IMPLANT
LASSO CRESCENT QUICKPASS (SUTURE) ×2 IMPLANT
MANIFOLD NEPTUNE II (INSTRUMENTS) ×2 IMPLANT
NDL SAFETY ECLIPSE 18X1.5 (NEEDLE) ×1 IMPLANT
NEEDLE HYPO 18GX1.5 SHARP (NEEDLE) ×2
NEEDLE SCORPION MULTI FIRE (NEEDLE) IMPLANT
PACK ARTHROSCOPY DSU (CUSTOM PROCEDURE TRAY) ×2 IMPLANT
PACK BASIN DAY SURGERY FS (CUSTOM PROCEDURE TRAY) ×2 IMPLANT
PAD COLD SHLDR WRAP-ON (PAD) ×2 IMPLANT
PORT APPOLLO RF 90DEGREE MULTI (SURGICAL WAND) ×2 IMPLANT
RESTRAINT HEAD UNIVERSAL NS (MISCELLANEOUS) ×2 IMPLANT
SHEET MEDIUM DRAPE 40X70 STRL (DRAPES) IMPLANT
SLEEVE SCD COMPRESS KNEE MED (STOCKING) ×2 IMPLANT
SLING ARM FOAM STRAP LRG (SOFTGOODS) IMPLANT
SUT FIBERWIRE #2 38 T-5 BLUE (SUTURE)
SUT MNCRL AB 4-0 PS2 18 (SUTURE) ×2 IMPLANT
SUT PDS AB 1 CT  36 (SUTURE) ×2
SUT PDS AB 1 CT 36 (SUTURE) ×1 IMPLANT
SUT TIGER TAPE 7 IN WHITE (SUTURE) IMPLANT
SUTURE FIBERWR #2 38 T-5 BLUE (SUTURE) IMPLANT
SUTURE TAPE TIGERLINK 1.3MM BL (SUTURE) IMPLANT
SUTURETAPE TIGERLINK 1.3MM BL (SUTURE)
SYR 5ML LL (SYRINGE) ×2 IMPLANT
TAPE FIBER 2MM 7IN #2 BLUE (SUTURE) IMPLANT
TOWEL GREEN STERILE FF (TOWEL DISPOSABLE) ×4 IMPLANT
TUBE CONNECTING 20X1/4 (TUBING) ×2 IMPLANT
TUBING ARTHROSCOPY IRRIG 16FT (MISCELLANEOUS) ×2 IMPLANT

## 2021-05-12 NOTE — Discharge Instructions (Addendum)
Ophelia Charter MD, MPH Noemi Chapel, PA-C [place] [address], [address] [phone] (tel)   [phone] (fax)   POST-OPERATIVE INSTRUCTIONS - SHOULDER ARTHROSCOPY  WOUND CARE You may remove the Operative Dressing on Post-Op Day #3 (72hrs after surgery).   Alternatively if you would like you can leave dressing on until follow-up if within 7-8 days but keep it dry. Leave steri-strips in place until they fall off on their own, usually 2 weeks postop. There may be a small amount of fluid/bleeding leaking at the surgical site.  This is normal; the shoulder is filled with fluid during the procedure and can leak for 24-48hrs after surgery.  You may change/reinforce the bandage as needed.  Use the Cryocuff or Ice as often as possible for the first 7 days, then as needed for pain relief. Always keep a towel, ACE wrap or other barrier between the cooling unit and your skin.  You may shower on Post-Op Day #3. Gently pat the area dry. Do not soak the shoulder in water or submerge it. Keep dry incisions as dry as possible. Do not go swimming in the pool or ocean until 4 weeks after surgery or when otherwise instructed.    EXERCISES/BRACING Sling should be used at all times until follow-up.  You can remove sling for hygiene.    Please continue to ambulate and do not stay sitting or lying for too long. Perform foot and wrist pumps to assist in circulation.  POST-OP MEDICATIONS- Multimodal approach to pain control In general your pain will be controlled with a combination of substances.  Prescriptions unless otherwise discussed are electronically sent to your pharmacy.  This is a carefully made plan we use to minimize narcotic use.     Acetaminophen - Non-narcotic pain medicine taken on a scheduled basis  Oxycodone - This is a strong narcotic, to be used only on an "as needed" basis for SEVERE pain. Robaxin - this is a muscle relaxer, take as needed for muscle  spasms Zofran - take as needed for nausea   FOLLOW-UP If you develop a Fever (?101.5), Redness or Drainage from the surgical incision site, please call our office to arrange for an evaluation. Please call the office to schedule a follow-up appointment for your wound check, 10-14 days post-operatively.    HELPFUL INFORMATION  If you had a block, it will wear off between 8-24 hrs postop typically.  This is period when your pain may go from nearly zero to the pain you would have had postop without the block.  This is an abrupt transition but nothing dangerous is happening.  You may take an extra dose of narcotic when this happens.  You may be more comfortable sleeping in a semi-seated position the first few nights following surgery.  Keep a pillow propped under the elbow and forearm for comfort.  If you have a recliner type of chair it might be beneficial.  If not that is fine too, but it would be helpful to sleep propped up with pillows behind your operated shoulder as well under your elbow and forearm.  This will reduce pulling on the suture lines.  When dressing, put your operative arm in the sleeve first.  When getting undressed, take your operative arm out last.  Loose fitting, button-down shirts are recommended.  Often in the first days after surgery you may be more comfortable keeping your operative arm under your shirt and not through the sleeve.  You may return to work/school  in the next couple of days when you feel up to it.  Desk work and typing in the sling is fine.  We suggest you use the pain medication the first night prior to going to bed, in order to ease any pain when the anesthesia wears off. You should avoid taking pain medications on an empty stomach as it will make you nauseous.  You should wean off your narcotic medicines as soon as you are able.  Most patients will be off or using minimal narcotics before their first postop appointment.   Do not drink alcoholic beverages  or take illicit drugs when taking pain medications.  It is against the law to drive while taking narcotics.  In some states it is against the law to drive while your arm is in a sling.   Pain medication may make you constipated.  Below are a few solutions to try in this order: Decrease the amount of pain medication if you aren't having pain. Drink lots of decaffeinated fluids. Drink prune juice and/or eat dried prunes  If the first 3 don't work start with additional solutions Take Colace - an over-the-counter stool softener Take Senokot - an over-the-counter laxative Take Miralax - a stronger over-the-counter laxative  For more information including helpful videos and documents visit our website:   [email or web address]-information.[email or web address]   No Tylenol until after 2pm if needed  Post Anesthesia Home Care Instructions  Activity: Get plenty of rest for the remainder of the day. A responsible individual must stay with you for 24 hours following the procedure.  For the next 24 hours, DO NOT: -Drive a car -Paediatric nurse -Drink alcoholic beverages -Take any medication unless instructed by your physician -Make any legal decisions or sign important papers.  Meals: Start with liquid foods such as gelatin or soup. Progress to regular foods as tolerated. Avoid greasy, spicy, heavy foods. If nausea and/or vomiting occur, drink only clear liquids until the nausea and/or vomiting subsides. Call your physician if vomiting continues.  Special Instructions/Symptoms: Your throat may feel dry or sore from the anesthesia or the breathing tube placed in your throat during surgery. If this causes discomfort, gargle with warm salt water. The discomfort should disappear within 24 hours.  If you had a scopolamine patch placed behind your ear for the management of post- operative nausea and/or vomiting:  1. The medication in the patch is effective for 72 hours, after which it should be  removed.  Wrap patch in a tissue and discard in the trash. Wash hands thoroughly with soap and water. 2. You may remove the patch earlier than 72 hours if you experience unpleasant side effects which may include dry mouth, dizziness or visual disturbances. 3. Avoid touching the patch. Wash your hands with soap and water after contact with the patch.         Regional Anesthesia Blocks  1. Numbness or the inability to move the "blocked" extremity may last from 3-48 hours after placement. The length of time depends on the medication injected and your individual response to the medication. If the numbness is not going away after 48 hours, call your surgeon.  2. The extremity that is blocked will need to be protected until the numbness is gone and the  Strength has returned. Because you cannot feel it, you will need to take extra care to avoid injury. Because it may be weak, you may have difficulty moving it or using it. You may not know what position it is in without  looking at it while the block is in effect.  3. For blocks in the legs and feet, returning to weight bearing and walking needs to be done carefully. You will need to wait until the numbness is entirely gone and the strength has returned. You should be able to move your leg and foot normally before you try and bear weight or walk. You will need someone to be with you when you first try to ensure you do not fall and possibly risk injury.  4. Bruising and tenderness at the needle site are common side effects and will resolve in a few days.  5. Persistent numbness or new problems with movement should be communicated to the surgeon or the [facility] [phone] [place] [phone]).      Information for Discharge Teaching: EXPAREL (bupivacaine liposome injectable suspension)   Your surgeon or anesthesiologist gave you EXPAREL(bupivacaine) to help control your pain after surgery.  EXPAREL is a local  anesthetic that provides pain relief by numbing the tissue around the surgical site. EXPAREL is designed to release pain medication over time and can control pain for up to 72 hours. Depending on how you respond to EXPAREL, you may require less pain medication during your recovery.  Possible side effects: Temporary loss of sensation or ability to move in the area where bupivacaine was injected. Nausea, vomiting, constipation Rarely, numbness and tingling in your mouth or lips, lightheadedness, or anxiety may occur. Call your doctor right away if you think you may be experiencing any of these sensations, or if you have other questions regarding possible side effects.  Follow all other discharge instructions given to you by your surgeon or nurse. Eat a healthy diet and drink plenty of water or other fluids.  If you return to the hospital for any reason within 96 hours following the administration of EXPAREL, it is important for health care providers to know that you have received this anesthetic. A teal colored band has been placed on your arm with the date, time and amount of EXPAREL you have received in order to alert and inform your health care providers. Please leave this armband in place for the full 96 hours following administration, and then you may remove the band.

## 2021-05-12 NOTE — Anesthesia Postprocedure Evaluation (Signed)
Anesthesia Post Note  Patient: Danny Cohen. Bir  Procedure(s) Performed: SHOULDER ARTHROSCOPY WITH SUBACROMIAL DECOMPRESSION AND BICEP TENDON REPAIR (Left: Shoulder)     Patient location during evaluation: PACU Anesthesia Type: General Level of consciousness: awake and alert and oriented Pain management: pain level controlled Vital Signs Assessment: post-procedure vital signs reviewed and stable Respiratory status: spontaneous breathing, nonlabored ventilation and respiratory function stable Cardiovascular status: blood pressure returned to baseline Postop Assessment: no apparent nausea or vomiting Anesthetic complications: no   No notable events documented.  Last Vitals:  Vitals:   05/12/21 1100 05/12/21 1115  BP: 128/68   Pulse: 74 72  Resp: 11 14  Temp:    SpO2: 100% 97%    Last Pain:  Vitals:   05/12/21 1115  TempSrc:   PainSc: 0-No pain                 Marthenia Rolling

## 2021-05-12 NOTE — Progress Notes (Signed)
Assisted Dr. Daiva Huge with left, ultrasound guided, interscalene  block. Side rails up, monitors on throughout procedure. See vital signs in flow sheet. Tolerated Procedure well.

## 2021-05-12 NOTE — Anesthesia Procedure Notes (Signed)
Procedure Name: Intubation Date/Time: 05/12/2021 9:39 AM Performed by: Ezequiel Kayser, CRNA Pre-anesthesia Checklist: Patient identified, Emergency Drugs available, Suction available and Patient being monitored Patient Re-evaluated:Patient Re-evaluated prior to induction Oxygen Delivery Method: Circle System Utilized Preoxygenation: Pre-oxygenation with 100% oxygen Induction Type: IV induction Ventilation: Mask ventilation without difficulty Laryngoscope Size: Mac and 3 Grade View: Grade I Tube type: Oral Tube size: 7.0 mm Number of attempts: 1 Airway Equipment and Method: Stylet and Oral airway Placement Confirmation: ETT inserted through vocal cords under direct vision, positive ETCO2 and breath sounds checked- equal and bilateral Secured at: 23 cm Tube secured with: Tape Dental Injury: Teeth and Oropharynx as per pre-operative assessment

## 2021-05-12 NOTE — Interval H&P Note (Signed)
All questions answered, patient wants to proceed with procedure. ? ?

## 2021-05-12 NOTE — Transfer of Care (Signed)
Immediate Anesthesia Transfer of Care Note  Patient: Danny Cohen. [name]  Procedure(s) Performed: SHOULDER ARTHROSCOPY WITH SUBACROMIAL DECOMPRESSION AND BICEP TENDON REPAIR (Left: Shoulder)  Patient Location: PACU  Anesthesia Type:GA combined with regional for post-op pain  Level of Consciousness: drowsy  Airway & Oxygen Therapy: Patient Spontanous Breathing and Patient connected to face mask oxygen  Post-op Assessment: Report given to RN and Post -op Vital signs reviewed and stable  Post vital signs: Reviewed and stable  Last Vitals:  Vitals Value Taken Time  BP    Temp    Pulse 71 05/12/21 1048  Resp 13 05/12/21 1048  SpO2 99 % 05/12/21 1048  Vitals shown include unvalidated device data.  Last Pain:  Vitals:   05/12/21 0755  TempSrc: Oral  PainSc: 4       Patients Stated Pain Goal: 3 ([id number])  Complications: No notable events documented.

## 2021-05-12 NOTE — Anesthesia Preprocedure Evaluation (Addendum)
Anesthesia Evaluation  Patient identified by MRN, date of birth, ID band Patient awake    Reviewed: Allergy & Precautions, NPO status , Patient's Chart, lab work & pertinent test results  History of Anesthesia Complications Negative for: history of anesthetic complications  Airway Mallampati: II  TM Distance: >3 FB Neck ROM: Full    Dental  (+) Missing,    Pulmonary former smoker,    Pulmonary exam normal        Cardiovascular negative cardio ROS Normal cardiovascular exam     Neuro/Psych negative neurological ROS  negative psych ROS   GI/Hepatic Neg liver ROS, PUD, GERD  Medicated and Controlled,  Endo/Other  negative endocrine ROS  Renal/GU negative Renal ROS  negative genitourinary   Musculoskeletal LEFT SHOULDER CARTILAGE DISORDER, OSTEOARTHRITIS,BURSITIS, BICIPITAL TENDINITIS   Abdominal   Peds  Hematology negative hematology ROS (+)   Anesthesia Other Findings Day of surgery medications reviewed with patient.  Reproductive/Obstetrics negative OB ROS                            Anesthesia Physical Anesthesia Plan  ASA: 2  Anesthesia Plan: General   Post-op Pain Management: GA combined w/ Regional for post-op pain   Induction: Intravenous  PONV Risk Score and Plan: 2 and Treatment may vary due to age or medical condition, Ondansetron, Dexamethasone and Midazolam  Airway Management Planned: Oral ETT  Additional Equipment: None  Intra-op Plan:   Post-operative Plan: Extubation in OR  Informed Consent: I have reviewed the patients History and Physical, chart, labs and discussed the procedure including the risks, benefits and alternatives for the proposed anesthesia with the patient or authorized representative who has indicated his/her understanding and acceptance.     Dental advisory given  Plan Discussed with: CRNA  Anesthesia Plan Comments:        Anesthesia  Quick Evaluation

## 2021-05-12 NOTE — Anesthesia Procedure Notes (Signed)
  Anesthesia Regional Block: Interscalene brachial plexus block   Pre-Anesthetic Checklist: , timeout performed,  Correct Patient, Correct Site, Correct Laterality,  Correct Procedure, Correct Position, site marked,  Risks and benefits discussed,  Pre-op evaluation,  At surgeon's request and post-op pain management  Laterality: Left  Prep: Maximum Sterile Barrier Precautions used, chloraprep       Needles:  Injection technique: Single-shot  Needle Type: Echogenic Stimulator Needle     Needle Length: 9cm  Needle Gauge: 22     Additional Needles:   Procedures:,,,, ultrasound used (permanent image in chart),,    Narrative:  Start time: 05/12/2021 8:31 AM End time: 05/12/2021 8:34 AM Injection made incrementally with aspirations every 5 mL.  Performed by: Personally  Anesthesiologist: Brennan Bailey, MD  Additional Notes: Risks, benefits, and alternative discussed. Patient gave consent for procedure. Patient prepped and draped in sterile fashion. Sedation administered, patient remains easily responsive to voice. Relevant anatomy identified with ultrasound guidance. Local anesthetic given in 5cc increments with no signs or symptoms of intravascular injection. No pain or paraesthesias with injection. Patient monitored throughout procedure with signs of LAST or immediate complications. Tolerated well. Ultrasound image placed in chart.  Tawny Asal, MD

## 2021-05-13 ENCOUNTER — Encounter (HOSPITAL_BASED_OUTPATIENT_CLINIC_OR_DEPARTMENT_OTHER): Payer: Self-pay | Admitting: Orthopaedic Surgery

## 2021-05-13 ENCOUNTER — Ambulatory Visit: Payer: No Typology Code available for payment source | Admitting: Physical Therapy

## 2021-05-17 NOTE — Op Note (Signed)
Orthopaedic Surgery Operative Note (CSN: [id number])  Danny Cohen  1965-12-31 Date of Surgery: 05/12/2021   Diagnoses:  Left SLAP tear, impingement, DC arthrosis  Procedure: Arthroscopic extensive debridement Arthroscopic subacromial decompression Arthroscopic biceps tenodesis Arthroscopic distal clavicle excision   Operative Finding Full motion, no instability, intact cartilage and cuff, Type 2 slap tear.  Successful completion of the planned procedure. Very unstable SLAP tear, no tearing of biceps.   Post-operative plan: The patient will be non-weightbearing in a sling 4 weeks.  The patient will be discharged home.  DVT prophylaxis not indicated in ambulatory upper extremity patient without known risk factors.   Pain control with PRN pain medication preferring oral medicines.  Follow up plan will be scheduled in approximately 7 days for incision check and XR.  Post-Op Diagnosis: Same Surgeons:Primary: Hiram Gash, MD Assistants:Caroline McBane PA-C Location: [facility] OR ROOM 6 Anesthesia: General with Exparel interscalene block Antibiotics: 2g ancef Tourniquet time: None Estimated Blood Loss: Minimal Complications: None Specimens: None Implants: Implant Name Type Inv. Item Serial No. Manufacturer Lot No. LRB No. Used Action  ANCHOR SUT 1.8 FBRTK KNTLS 2SU - [id number] Anchor ANCHOR SUT 1.8 FBRTK KNTLS 2SU  [employer] [id number] Left 1 Implanted  ANCHOR SUT 1.8 FBRTK KNTLS 2SU - [id number] Anchor ANCHOR SUT 1.8 FBRTK [employer] [id number] Left 1 Implanted    Indications for Surgery:   Danny Cohen is a 55 y.o. male with continued shoulder pain refractory to nonoperative measures for extended period of time.    The risks and benefits were explained at length including but not limited to continued pain, cuff failure, biceps tenodesis failure, stiffness, need for further surgery and infection.   Procedure:   Patient was correctly identified in the preoperative holding  area and operative site marked.  Patient brought to OR and positioned beachchair on an [place] table ensuring that all bony prominences were padded and the head was in an appropriate location.  Anesthesia was induced and the operative shoulder was prepped and draped in the usual sterile fashion.  Timeout was called preincision.  A standard posterior viewing portal was made after localizing the portal with a spinal needle.  An anterior accessory portal was also made.  After clearing the articular space the camera was positioned in the subacromial space.  Findings above.    Extensive debridement was performed of the anterior interval tissue, labral fraying and the bursa.  Subacromial decompression: We made a lateral portal with spinal needle guidance. We then proceeded to debride bursal tissue extensively with a shaver and arthrocare device. At that point we continued to identify the borders of the acromion and identify the spur. We then carefully preserved the deltoid fascia and used a burr to convert the acromion to a Type 1 flat acromion without issue.  Biceps tenodesis: We marked the tendon and then performed a tenotomy and debridement of the stump in the articular space. We then identified the biceps tendon in its groove suprapec with the arthroscope in the lateral portal taking care to move from lateral to medial to avoid injury to the subscapularis. At that point we unroofed the tendon itself and mobilized it. An accessory anterior portal was made in line with the tendon and we grasped it from the anterior superior portal and worked from the accessory anterior portal. Two Fibertak 1.17m knotless anchors were placed in the groove and the tendon was secured in a luggage loop style fashion with a pass of the limb of  suture through the tendon using a scorpion device to avoid pull-through.  Repair was completed with good tension on the tendon.  Residual stump of the tendon was removed after being resected with  a RF ablator.  Distal Clavicle resection:  The scope was placed in the subacromial space from the posterior portal.  A hemostat was placed through the anterior portal and we spread at the [facility] joint.  A burr was then inserted and 10 mm of distal clavicle was resected taking care to avoid damage to the capsule around the joint and avoiding overhanging bone posteriorly.     The incisions were closed with absorbable monocryl and steri strips.  A sterile dressing was placed along with a sling. The patient was awoken from general anesthesia and taken to the PACU in stable condition without complication.   Noemi Chapel, PA-C, present and scrubbed throughout the case, critical for completion in a timely fashion, and for retraction, instrumentation, closure.

## 2021-05-19 ENCOUNTER — Encounter (INDEPENDENT_AMBULATORY_CARE_PROVIDER_SITE_OTHER): Payer: No Typology Code available for payment source | Admitting: Ophthalmology

## 2021-05-19 ENCOUNTER — Ambulatory Visit (INDEPENDENT_AMBULATORY_CARE_PROVIDER_SITE_OTHER): Payer: No Typology Code available for payment source | Admitting: Rehabilitative and Restorative Service Providers"

## 2021-05-19 ENCOUNTER — Encounter: Payer: Self-pay | Admitting: Rehabilitative and Restorative Service Providers"

## 2021-05-19 ENCOUNTER — Other Ambulatory Visit: Payer: Self-pay

## 2021-05-19 DIAGNOSIS — R293 Abnormal posture: Secondary | ICD-10-CM | POA: Diagnosis not present

## 2021-05-19 DIAGNOSIS — R29898 Other symptoms and signs involving the musculoskeletal system: Secondary | ICD-10-CM

## 2021-05-19 DIAGNOSIS — M25512 Pain in left shoulder: Secondary | ICD-10-CM

## 2021-05-19 DIAGNOSIS — I10 Essential (primary) hypertension: Secondary | ICD-10-CM

## 2021-05-19 DIAGNOSIS — H35033 Hypertensive retinopathy, bilateral: Secondary | ICD-10-CM

## 2021-05-19 DIAGNOSIS — H3581 Retinal edema: Secondary | ICD-10-CM

## 2021-05-19 DIAGNOSIS — H25813 Combined forms of age-related cataract, bilateral: Secondary | ICD-10-CM

## 2021-05-19 DIAGNOSIS — H35711 Central serous chorioretinopathy, right eye: Secondary | ICD-10-CM

## 2021-05-19 NOTE — Patient Instructions (Signed)
Access Code: [email or web address] URL: [email or web address] Date: 05/19/2021 Prepared by: Gillermo Murdoch  Exercises Circular Shoulder Pendulum with Table Support - 3-4 x daily - 7 x weekly - 1 sets - 20-30 reps Seated Shoulder Flexion Towel Slide at Table Top Full Range of Motion - 2 x daily - 7 x weekly - 1 sets - 5-10 reps - 10sec hold Seated Scapular Retraction - 2 x daily - 7 x weekly - 1-2 sets - 10 reps - 10 sec hold

## 2021-05-19 NOTE — Therapy (Signed)
[place], [place], [address] Phone: [phone]   Fax:  [phone]  Physical Therapy Evaluation  Patient Details  Name: Danny Cohen. Danny Cohen MRN: [id number] Date of Birth: [birth year] Referring Provider (PT): Dr Ophelia Charter   Encounter Date: 05/19/2021   PT End of Session - 05/19/21 1808     Visit Number 1    Number of Visits 12    Date for PT Re-Evaluation 07/14/21    PT Start Time 1530    PT Stop Time [id number]    PT Time Calculation (min) 45 min    Activity Tolerance Patient tolerated treatment well             Past Medical History:  Diagnosis Date   Arthritis    Chronic back pain greater than 3 months duration 09/03/2011   Esophageal reflux disease 09/03/2011   Gastric ulcer due to Helicobacter pylori [id number]   Peptic ulcer due to Helicobacter pylori [id number]    Past Surgical History:  Procedure Laterality Date   HERNIA REPAIR     LAPAROSCOPIC INGUINAL HERNIA REPAIR  04/23/2012   bilateral   SHOULDER ARTHROSCOPY WITH SUBACROMIAL DECOMPRESSION AND BICEP TENDON REPAIR Left 05/12/2021   Procedure: SHOULDER ARTHROSCOPY WITH SUBACROMIAL DECOMPRESSION AND BICEP TENDON REPAIR;  Surgeon: Hiram Gash, MD;  Location: [facility];  Service: Orthopedics;  Laterality: Left;    There were no vitals filed for this visit.    Subjective Assessment - 05/19/21 1533     Subjective Patient reports that he has been having shoulder pain over the past several years. He underwent Lt SAD and biceps tendon repair 05/12/21. He has had two episodes of shoulder pain and will see MD tomorrow for a recheck.    Pertinent History cervical and shoulder pain for years; hernia surgery; arthritis    Patient Stated Goals get shoulder working without pain    Currently in Pain? No/denies    Pain Score 0-No pain    Pain Location Shoulder    Pain Orientation Left    Pain Descriptors / Indicators  Throbbing;Shooting    Pain Type Surgical pain;Chronic pain    Pain Radiating Towards into neck and down into the arm/ribs    Pain Onset More than a month ago    Pain Frequency Intermittent    Aggravating Factors  movement    Pain Relieving Factors ice; meds                [facility] PT Assessment - 05/19/21 0001       Assessment   Medical Diagnosis Lt shoulder SAD; biceps tendon repair    Referring Provider (PT) Dr Ophelia Charter    Onset Date/Surgical Date 08/25/20   pain in the shoulder for several years   Hand Dominance Right    Next MD Visit 05/20/21    Prior Therapy here for neck and shoulder      Precautions   Precautions Shoulder    Type of Shoulder Precautions post op precautions      Restrictions   Weight Bearing Restrictions No      Balance Screen   Has the patient fallen in the past 6 months No    Has the patient had a decrease in activity level because of a fear of falling?  No    Is the patient reluctant to leave their home because of a fear of falling?  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, site visits    Leisure yard work; Engineer, building services      Observation/Other Assessments   Observations presents in [place] on Therapeutic Outcomes (FOTO)  44      Sensation   Additional Comments Lt hand intermittent numbness which is now improving      Posture/Postural Control   Posture Comments head forward; shoudlers rounded and elevated; head of the humerus anterio rin orientation; scapulae abducted and rotated along the thoracic wall      AROM   Right/Left Shoulder --   Not tested on Lt   Right Shoulder Extension 40 Degrees    Right Shoulder Flexion 151 Degrees    Right Shoulder ABduction 148 Degrees    Right Shoulder Internal Rotation --   thumb to T5    Right Shoulder External Rotation 90 Degrees      PROM   Left Shoulder Flexion 122 Degrees    Left Shoulder ABduction 109 Degrees   in scapular plane   Left Shoulder External Rotation 38 Degrees   in scapular plane     Strength   Overall Strength Comments not assessed      Palpation   Palpation comment muscular tightness Lt shoulder girdle                        Objective measurements completed on examination: See above findings.       [facility] Adult PT Treatment/Exercise - 05/19/21 0001       Shoulder Exercises: Standing   Other Standing Exercises scap squeeze 10 sec x 10 reps      Shoulder Exercises: ROM/Strengthening   Pendulum fwd/back; side to side; circles CW/CCW x 10-20 each      Shoulder Exercises: Stretch   Table Stretch -Flexion Limitations 10 sec hold x 10 reps      Manual Therapy   Joint Mobilization pt supine hooklying    Soft tissue mobilization Lt shoulder girdle musculature working through upper trap, leveator, pecs, deltoid    Scapular Mobilization Lt    Passive ROM Lt shoulder flexion; abduction in scapular plane; ER in scapular plane                     PT Education - 05/19/21 1801     Education Details HEP POC    Person(s) Educated Patient    Methods Explanation;Demonstration;Tactile cues;Verbal cues;Handout    Comprehension Verbalized understanding;Returned demonstration;Verbal cues required;Tactile cues required              PT Short Term Goals - 05/19/21 1814       PT SHORT TERM GOAL #1   Title Will be compliant with appropriate HEP    Time 4    Period Weeks    Status New    Target Date 06/16/21      PT SHORT TERM GOAL #2   Title Patient to report decreased post op pain with pain no greater than 2-3/10 on 1-10 scale    Baseline -    Time 4    Period Weeks    Status New    Target Date 06/16/21      PT SHORT TERM GOAL #3   Title Instruct in proper posture and alignment engaging  posterior shoulder  musculature for all exercises    Time 4    Period Weeks    Status New    Target Date 06/16/21               PT Long Term Goals - 05/19/21 1816       PT LONG TERM GOAL #1   Title Increase PROM/AROM Lt shoulder to >/= AROM Rt shoulder    Time 8    Period Weeks    Status New    Target Date 07/14/21      PT LONG TERM GOAL #2   Title Patient to demonstrate 4/5 strength Lt UE to perform functional activities per protocol    Baseline -    Time 8    Period Weeks    Status New    Target Date 07/14/21      PT LONG TERM GOAL #3   Title Patient reports ability to return to work and leisure activities as indicated    Time 8    Period Weeks    Status New    Target Date 07/14/21      PT LONG TERM GOAL #4   Title Independent in HEP    Time 8    Period Weeks    Status New    Target Date 07/14/21      PT LONG TERM GOAL #5   Title Improve functional score to 54    Baseline -    Time 8    Period Weeks    Status New    Target Date 07/14/21                    Plan - 05/19/21 1809     Clinical Impression Statement Patient presents with a history of Lt shoulder pain and cervical pain for several years. He underwent elective Lt shoulder surgery 05/12/21 for SAD and biceps tendon repair. He has done well post op but reports two incidents in which he felt a pop and experienced pain in the Lt shoulder (deltoid insertion area). [place] will f/u with MD in appointment tomorrow. Patient has poor posture and alignment; limited Lt UE ROM/strength/function post surgery. He will benefit form PT to address problems identified.    Stability/Clinical Decision Making Stable/Uncomplicated    Clinical Decision Making Low    Rehab Potential Good    PT Frequency 2x / week    PT Duration 8 weeks    PT Treatment/Interventions ADLs/Self Care Home Management;Aquatic Therapy;Cryotherapy;Electrical Stimulation;Iontophoresis '4mg'$ /ml Dexamethasone;Moist Heat;Ultrasound;Functional mobility  training;Therapeutic exercise;Neuromuscular re-education;Patient/family education;Manual techniques;Passive range of motion;Dry needling;Taping;Vasopneumatic Device    PT Next Visit Plan review and progress exercise; manual therapy; PROM/stretching; progress with shoudler rehab per protocol; modalities as indicated    PT Home Exercise Plan [email or web address]    Consulted and Agree with Plan of Care Patient             Patient will benefit from skilled therapeutic intervention in order to improve the following deficits and impairments:  Decreased range of motion, Increased fascial restricitons, Impaired UE functional use, Pain, Decreased activity tolerance, Hypomobility, Impaired flexibility, Improper body mechanics, Decreased mobility, Decreased strength, Increased edema, Postural dysfunction  Visit Diagnosis: Left shoulder pain, unspecified chronicity  Other symptoms and signs involving the musculoskeletal system  Abnormal posture  Weakness of left upper extremity     Problem List Patient Active Problem List   Diagnosis Date Noted   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 07/30/2020   Arthritis  of first metatarsophalangeal (MTP) joint of right foot 07/02/2020   Occipital neuralgia 03/18/2020   Phlebitis 02/01/2020   Well adult exam 01/23/2020   Cervical radiculopathy 12/22/2019   Rectal bleeding [id number]   Folliculitis [id number]   Hydrocele 09/26/2017   Hyperglycemia 08/26/2014   Hyperkalemia 06/25/2014   Abnormal CT scan, small bowel 04/13/2014   Liver hemangioma 12/16/2012   Urinary frequency 09/02/2012   Bilateral inguinal hernia (BIH) 03/25/2012   Esophageal reflux disease 09/03/2011   Gastric ulcer due to Helicobacter pylori [id number]   Chronic back pain greater than 3 months duration 09/03/2011   Neck pain, chronic 07/06/2011    Eucl[place] Cassetta Nilda Simmer, PT, MPH  05/19/2021, 6:22 PM  [facility] [place], [place], [address] Phone: [phone]   Fax:  [phone]  Name: Danny Cohen. Danny Cohen MRN: [id number] Date of Birth: 21-Jul-1966

## 2021-05-24 ENCOUNTER — Other Ambulatory Visit: Payer: Self-pay | Admitting: Family Medicine

## 2021-06-01 ENCOUNTER — Other Ambulatory Visit: Payer: Self-pay

## 2021-06-01 ENCOUNTER — Ambulatory Visit (INDEPENDENT_AMBULATORY_CARE_PROVIDER_SITE_OTHER): Payer: No Typology Code available for payment source | Admitting: Physical Therapy

## 2021-06-01 DIAGNOSIS — R293 Abnormal posture: Secondary | ICD-10-CM | POA: Diagnosis not present

## 2021-06-01 DIAGNOSIS — R29898 Other symptoms and signs involving the musculoskeletal system: Secondary | ICD-10-CM | POA: Diagnosis not present

## 2021-06-01 DIAGNOSIS — M25512 Pain in left shoulder: Secondary | ICD-10-CM

## 2021-06-01 NOTE — Therapy (Signed)
[place], [place], [address] Phone: [phone]   Fax:  [phone]  Physical Therapy Treatment  Patient Details  Name: Danny Cohen MRN: [id number] Date of Birth: 10/05/1965 Referring Provider (PT): Dr Ophelia Charter   Encounter Date: 06/01/2021   PT End of Session - 06/01/21 0836     Visit Number 2    Number of Visits 12    Date for PT Re-Evaluation 07/14/21    PT Start Time 0800    PT Stop Time 0840    PT Time Calculation (min) 40 min    Activity Tolerance Patient tolerated treatment well    Behavior During Therapy [facility] for tasks assessed/performed             Past Medical History:  Diagnosis Date   Arthritis    Chronic back pain greater than 3 months duration 09/03/2011   Esophageal reflux disease 09/03/2011   Gastric ulcer due to Helicobacter pylori 87/68/1157   Peptic ulcer due to Helicobacter pylori 26/20/3559    Past Surgical History:  Procedure Laterality Date   HERNIA REPAIR     LAPAROSCOPIC INGUINAL HERNIA REPAIR  04/23/2012   bilateral   SHOULDER ARTHROSCOPY WITH SUBACROMIAL DECOMPRESSION AND BICEP TENDON REPAIR Left 05/12/2021   Procedure: SHOULDER ARTHROSCOPY WITH SUBACROMIAL DECOMPRESSION AND BICEP TENDON REPAIR;  Surgeon: Hiram Gash, MD;  Location: [facility];  Service: Orthopedics;  Laterality: Left;    There were no vitals filed for this visit.   Subjective Assessment - 06/01/21 0805     Subjective Pt reports he is having much less pain than last week. He states he is feeling "much better"    Patient Stated Goals get shoulder working without pain    Currently in Pain? Yes    Pain Score 2     Pain Location Shoulder    Pain Orientation Left                [facility] PT Assessment - 06/01/21 0001       Assessment   Medical Diagnosis Lt shoulder SAD; biceps tendon repair    Referring Provider (PT) Dr Ophelia Charter    Onset Date/Surgical Date  05/12/21   pain in the shoulder for several years   Hand Dominance Right      Precautions   Type of Shoulder Precautions post op precautions      AROM   Right Shoulder Flexion --    Left Shoulder Flexion 145 Degrees    Left Shoulder ABduction 135 Degrees    Left Shoulder External Rotation 38 Degrees                           [facility] Adult PT Treatment/Exercise - 06/01/21 0001       Shoulder Exercises: Standing   Flexion 10 reps;AAROM    Flexion Limitations wall ladder    Other Standing Exercises scap squeeze 10 sec x 10 reps      Shoulder Exercises: Pulleys   Flexion 2 minutes    ABduction 2 minutes      Modalities   Modalities Vasopneumatic      Vasopneumatic   Number Minutes Vasopneumatic  10 minutes    Vasopnuematic Location  Shoulder    Vasopneumatic Pressure Low    Vasopneumatic Temperature  34      Manual Therapy   Soft tissue mobilization Lt pecs, deltoids    Passive ROM Lt  shoulder flexion; abduction in scapular plane; ER in scapular plane                     PT Education - 06/01/21 0834     Education Details updated HEP    Person(s) Educated Patient    Methods Explanation;Demonstration;Handout    Comprehension Returned demonstration;Verbalized understanding              PT Short Term Goals - 05/19/21 1814       PT SHORT TERM GOAL #1   Title Will be compliant with appropriate HEP    Time 4    Period Weeks    Status New    Target Date 06/16/21      PT SHORT TERM GOAL #2   Title Patient to report decreased post op pain with pain no greater than 2-3/10 on 1-10 scale    Baseline -    Time 4    Period Weeks    Status New    Target Date 06/16/21      PT SHORT TERM GOAL #3   Title Instruct in proper posture and alignment engaging posterior shoulder musculature for all exercises    Time 4    Period Weeks    Status New    Target Date 06/16/21               PT Long Term Goals - 05/19/21 1816       PT LONG  TERM GOAL #1   Title Increase PROM/AROM Lt shoulder to >/= AROM Rt shoulder    Time 8    Period Weeks    Status New    Target Date 07/14/21      PT LONG TERM GOAL #2   Title Patient to demonstrate 4/5 strength Lt UE to perform functional activities per protocol    Baseline -    Time 8    Period Weeks    Status New    Target Date 07/14/21      PT LONG TERM GOAL #3   Title Patient reports ability to return to work and leisure activities as indicated    Time 8    Period Weeks    Status New    Target Date 07/14/21      PT LONG TERM GOAL #4   Title Independent in HEP    Time 8    Period Weeks    Status New    Target Date 07/14/21      PT LONG TERM GOAL #5   Title Improve functional score to 54    Baseline -    Time 8    Period Weeks    Status New    Target Date 07/14/21                   Plan - 06/01/21 0837     Clinical Impression Statement Pt with improving PROM and AROM and decreased pain. HEP updated.    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 07/30/2020   Arthritis of first metatarsophalangeal (MTP) joint of right foot 07/02/2020   Occipital neuralgia 03/18/2020   Phlebitis 02/01/2020   Well adult exam 01/23/2020   Cervical radiculopathy 12/22/2019   Rectal bleeding 69/67/8938   Folliculitis 06/27/5101   Hydrocele 09/26/2017   Hyperglycemia 08/26/2014   Hyperkalemia 06/25/2014   Abnormal CT scan, small bowel 04/13/2014    Liver hemangioma 12/16/2012   Urinary frequency 09/02/2012   Bilateral inguinal hernia (BIH) 03/25/2012   Esophageal reflux disease 09/03/2011   Gastric ulcer due to Helicobacter pylori 58/52/7782   Chronic back pain greater than 3 months duration 09/03/2011   Neck pain, chronic 07/06/2011    Keishana Klinger, PT 06/01/2021, 8:39 AM  [facility] [place], [place], [address] Phone: [phone]   Fax:  [phone]  Name: Danny Cohen MRN: [id number] Date of Birth: 06-Jul-1966

## 2021-06-01 NOTE — Patient Instructions (Signed)
Access Code: [email or web address] URL: [email or web address] Date: 06/01/2021 Prepared by: Isabelle Course  Exercises Circular Shoulder Pendulum with Table Support - 3-4 x daily - 7 x weekly - 1 sets - 20-30 reps Seated Shoulder Flexion Towel Slide at Table Top Full Range of Motion - 2 x daily - 7 x weekly - 1 sets - 5-10 reps - 10sec hold Seated Shoulder Abduction Towel Slide at Table Top with Forearm in Neutral - 2 x daily - 7 x weekly - 1 sets - 5-10 reps - 10 sec hold Seated Scapular Retraction - 2 x daily - 7 x weekly - 1-2 sets - 10 reps - 10 sec hold Supine Shoulder External Rotation Stretch - 1 x daily - 7 x weekly - 1 sets - 1 reps - 3-5 minutes hold

## 2021-06-03 ENCOUNTER — Other Ambulatory Visit: Payer: Self-pay

## 2021-06-03 ENCOUNTER — Ambulatory Visit (INDEPENDENT_AMBULATORY_CARE_PROVIDER_SITE_OTHER): Payer: No Typology Code available for payment source | Admitting: Sports Medicine

## 2021-06-03 DIAGNOSIS — M5412 Radiculopathy, cervical region: Secondary | ICD-10-CM

## 2021-06-03 DIAGNOSIS — G8929 Other chronic pain: Secondary | ICD-10-CM

## 2021-06-03 DIAGNOSIS — M25512 Pain in left shoulder: Secondary | ICD-10-CM | POA: Diagnosis not present

## 2021-06-03 MED ORDER — PREGABALIN 50 MG PO CAPS: 50.0000 mg | ORAL_CAPSULE | Freq: Two times a day (BID) | ORAL | 1 refills | Status: DC

## 2021-06-03 NOTE — Progress Notes (Signed)
    Procedures performed today:    None.  Independent interpretation of notes and tests performed by another provider:   None.  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 Griffin Basil, overall doing okay, he had a bit of increase in pain, I explained to him this was normal, he should use the pain medicine prescribed by Dr. Griffin Basil and return to see me as needed.  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 We did get a consultation with Dr. Christella Noa, Dr. Christella Noa has suggested disc replacement at the C6-C7 level rather than ACDF, at this point if he remains pain-free with Lyrica I would suggest he hold off from operative intervention.    ___________________________________________ Gwen Her. Dianah Field, M.D., ABFM., CAQSM. Primary Care and [place] Instructor of [place] of [facility] [employer]

## 2021-06-03 NOTE — Assessment & Plan Note (Signed)
Danny Cohen returns, he is a pleasant 55 year old male, C6-C7 DDD, after 2 epidurals he has only noted a couple of days of relief, he did get some improvement with Celebrex and then the switch from gabapentin to Lyrica which has worked Recruitment consultant, he has very little pain now. We did get a consultation with Dr. Christella Noa, Dr. Christella Noa has suggested disc replacement at the C6-C7 level rather than ACDF, at this point if he remains pain-free with Lyrica I would suggest he hold off from operative intervention.

## 2021-06-03 NOTE — Assessment & Plan Note (Signed)
Danny Cohen is now about 3 weeks post arthroscopic debridement of his SLAP tear, distal clavicular excision, subacromial decompression, and biceps tenodesis with Dr. Griffin Basil, overall doing okay, he had a bit of increase in pain, I explained to him this was normal, he should use the pain medicine prescribed by Dr. Griffin Basil and return to see me as needed.

## 2021-06-09 ENCOUNTER — Ambulatory Visit (INDEPENDENT_AMBULATORY_CARE_PROVIDER_SITE_OTHER): Payer: No Typology Code available for payment source | Admitting: Rehabilitative and Restorative Service Providers"

## 2021-06-09 ENCOUNTER — Encounter: Payer: Self-pay | Admitting: Rehabilitative and Restorative Service Providers"

## 2021-06-09 ENCOUNTER — Other Ambulatory Visit: Payer: Self-pay

## 2021-06-09 DIAGNOSIS — R293 Abnormal posture: Secondary | ICD-10-CM | POA: Diagnosis not present

## 2021-06-09 DIAGNOSIS — M542 Cervicalgia: Secondary | ICD-10-CM | POA: Diagnosis not present

## 2021-06-09 DIAGNOSIS — M25512 Pain in left shoulder: Secondary | ICD-10-CM

## 2021-06-09 DIAGNOSIS — R29898 Other symptoms and signs involving the musculoskeletal system: Secondary | ICD-10-CM | POA: Diagnosis not present

## 2021-06-09 NOTE — Therapy (Signed)
[facility] [place], [place], [address] Phone: [phone]   Fax:  [phone]  Physical Therapy Treatment  Patient Details  Name: Danny Cohen MRN: [id number] Date of Birth: Nov 05, 1965 Referring Provider (PT): Dr Ophelia Charter   Encounter Date: 06/09/2021   PT End of Session - 06/09/21 1454     Visit Number 3    Number of Visits 12    Date for PT Re-Evaluation 07/14/21    PT Start Time 2706    PT Stop Time 1534    PT Time Calculation (min) 47 min    Activity Tolerance Patient tolerated treatment well             Past Medical History:  Diagnosis Date   Arthritis    Chronic back pain greater than 3 months duration 09/03/2011   Esophageal reflux disease 09/03/2011   Gastric ulcer due to Helicobacter pylori 23/76/2831   Peptic ulcer due to Helicobacter pylori 51/76/1607    Past Surgical History:  Procedure Laterality Date   HERNIA REPAIR     LAPAROSCOPIC INGUINAL HERNIA REPAIR  04/23/2012   bilateral   SHOULDER ARTHROSCOPY WITH SUBACROMIAL DECOMPRESSION AND BICEP TENDON REPAIR Left 05/12/2021   Procedure: SHOULDER ARTHROSCOPY WITH SUBACROMIAL DECOMPRESSION AND BICEP TENDON REPAIR;  Surgeon: Hiram Gash, MD;  Location: [facility];  Service: Orthopedics;  Laterality: Left;    There were no vitals filed for this visit.   Subjective Assessment - 06/09/21 1454     Subjective Patient reports that he had significant increase pain in the Lt shoulder from exercise last week - sliding arm out to the side on the table. He bumped his MD appointment up to Tuesday. MD felt shoulder was okay but patient should not do the abduction.    Currently in Pain? Yes    Pain Score 4     Pain Location Shoulder    Pain Orientation Left    Pain Descriptors / Indicators Aching    Pain Type Chronic pain;Surgical pain    Pain Onset More than a month ago    Pain Frequency Intermittent                 [facility] PT Assessment - 06/09/21 0001       Assessment   Medical Diagnosis Lt shoulder SAD; biceps tendon repair    Referring Provider (PT) Dr Ophelia Charter    Onset Date/Surgical Date 05/12/21   pain in the shoulder for several years   Hand Dominance Right    Next MD Visit 05/20/21    Prior Therapy here for neck and shoulder      PROM   Left Shoulder Flexion 156 Degrees    Left Shoulder ABduction 138 Degrees   in scapular plane   Left Shoulder External Rotation 42 Degrees   in scapular plane                          [facility] Adult PT Treatment/Exercise - 06/09/21 0001       Shoulder Exercises: Supine   Other Supine Exercises chest lift 10 sec x 10 reps      Shoulder Exercises: Standing   Flexion AAROM;Left;10 reps    Flexion Limitations towel slide on wall assisting Lt UE with Rt UE    Other Standing Exercises scap squeeze 10 sec x 10 reps      Shoulder Exercises: Pulleys   Flexion 2 minutes  Scaption 2 minutes      Shoulder Exercises: ROM/Strengthening   Pendulum fwd/back; side to side; circles CW/CCW x 10-20 each      Shoulder Exercises: Stretch   External Rotation Stretch 10 seconds   10 reps     Manual Therapy   Joint Mobilization pt supine hooklying    Soft tissue mobilization Lt shoulder girdle inc pecs; upper trpa; leveator; biceps; teres/lats    Scapular Mobilization Lt    Passive ROM Lt shoulder flexion; abduction in scapular plane; ER in scapular plane                     PT Education - 06/09/21 1524     Education Details HEP    Person(s) Educated Patient    Methods Explanation;Demonstration;Tactile cues;Verbal cues;Handout    Comprehension Verbalized understanding;Returned demonstration;Verbal cues required;Tactile cues required              PT Short Term Goals - 05/19/21 1814       PT SHORT TERM GOAL #1   Title Will be compliant with appropriate HEP    Time 4    Period Weeks    Status New    Target Date 06/16/21       PT SHORT TERM GOAL #2   Title Patient to report decreased post op pain with pain no greater than 2-3/10 on 1-10 scale    Baseline -    Time 4    Period Weeks    Status New    Target Date 06/16/21      PT SHORT TERM GOAL #3   Title Instruct in proper posture and alignment engaging posterior shoulder musculature for all exercises    Time 4    Period Weeks    Status New    Target Date 06/16/21               PT Long Term Goals - 05/19/21 1816       PT LONG TERM GOAL #1   Title Increase PROM/AROM Lt shoulder to >/= AROM Rt shoulder    Time 8    Period Weeks    Status New    Target Date 07/14/21      PT LONG TERM GOAL #2   Title Patient to demonstrate 4/5 strength Lt UE to perform functional activities per protocol    Baseline -    Time 8    Period Weeks    Status New    Target Date 07/14/21      PT LONG TERM GOAL #3   Title Patient reports ability to return to work and leisure activities as indicated    Time 8    Period Weeks    Status New    Target Date 07/14/21      PT LONG TERM GOAL #4   Title Independent in HEP    Time 8    Period Weeks    Status New    Target Date 07/14/21      PT LONG TERM GOAL #5   Title Improve functional score to 54    Baseline -    Time 8    Period Weeks    Status New    Target Date 07/14/21                   Plan - 06/09/21 1505     Clinical Impression Statement Patient reports that he had increased pain in the Lt shoulder following last PT  visit with abduction slide on table. Added AAROM and scapular stabilization without dififculty. Continued with manual work and [place]. Encouraged patient to avoid exercises or activities that irritate shoulder and ice after exercise or stressful activities    Rehab Potential Good    PT Frequency 2x / week    PT Duration 8 weeks    PT Treatment/Interventions ADLs/Self Care Home Management;Aquatic Therapy;Cryotherapy;Electrical Stimulation;Iontophoresis 4mg /ml  Dexamethasone;Moist Heat;Ultrasound;Functional mobility training;Therapeutic exercise;Neuromuscular re-education;Patient/family education;Manual techniques;Passive range of motion;Dry needling;Taping;Vasopneumatic Device    PT Next Visit Plan progress AROM per protocol, manual and modalities as indicated    PT Home Exercise Plan [email or web address]    Consulted and Agree with Plan of Care Patient             Patient will benefit from skilled therapeutic intervention in order to improve the following deficits and impairments:     Visit Diagnosis: Left shoulder pain, unspecified chronicity  Other symptoms and signs involving the musculoskeletal system  Weakness of left upper extremity  Abnormal posture  Cervicalgia     Problem List Patient Active Problem List   Diagnosis Date Noted   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 07/30/2020   Arthritis of first metatarsophalangeal (MTP) joint of right foot 07/02/2020   Occipital neuralgia 03/18/2020   Phlebitis 02/01/2020   Well adult exam 01/23/2020   Cervical radiculopathy 12/22/2019   Rectal bleeding 58/52/7782   Folliculitis 42/35/3614   Hydrocele 09/26/2017   Hyperglycemia 08/26/2014   Hyperkalemia 06/25/2014   Abnormal CT scan, small bowel 04/13/2014   Liver hemangioma 12/16/2012   Urinary frequency 09/02/2012   Bilateral inguinal hernia (BIH) 03/25/2012   Esophageal reflux disease 09/03/2011   Gastric ulcer due to Helicobacter pylori 43/15/4008   Chronic back pain greater than 3 months duration 09/03/2011   Neck pain, chronic 07/06/2011    Kamoria Lucien Nilda Simmer, PT, MPH  06/09/2021, 3:32 PM  [facility] [place], [place], [address] Phone: [phone]   Fax:  [phone]  Name: Danny Cohen MRN: [id number] Date of Birth: 1966/01/23

## 2021-06-09 NOTE — Patient Instructions (Signed)
Access Code: [email or web address] URL: [email or web address] Date: 06/09/2021 Prepared by: Gillermo Murdoch  Exercises Circular Shoulder Pendulum with Table Support - 3-4 x daily - 7 x weekly - 1 sets - 20-30 reps Seated Shoulder Flexion Towel Slide at Table Top Full Range of Motion - 2 x daily - 7 x weekly - 1 sets - 5-10 reps - 10sec hold Seated Scapular Retraction - 2 x daily - 7 x weekly - 1-2 sets - 10 reps - 10 sec hold Seated Shoulder External Rotation AAROM with Cane and Hand in Neutral - 1 x daily - 7 x weekly - 1 sets - 5-10 reps - 5-10 sec hold Shoulder Flexion Wall Slide with Towel - 1 x daily - 7 x weekly - 1 sets - 5-10 reps - 10 sec hold Supine Scapular Retraction - 2 x daily - 7 x weekly - 1 sets - 10 reps - 5-10 sec hold

## 2021-06-16 ENCOUNTER — Ambulatory Visit (INDEPENDENT_AMBULATORY_CARE_PROVIDER_SITE_OTHER): Payer: No Typology Code available for payment source | Admitting: Rehabilitative and Restorative Service Providers"

## 2021-06-16 ENCOUNTER — Other Ambulatory Visit: Payer: Self-pay

## 2021-06-16 ENCOUNTER — Encounter: Payer: Self-pay | Admitting: Rehabilitative and Restorative Service Providers"

## 2021-06-16 DIAGNOSIS — R29898 Other symptoms and signs involving the musculoskeletal system: Secondary | ICD-10-CM | POA: Diagnosis not present

## 2021-06-16 DIAGNOSIS — M25512 Pain in left shoulder: Secondary | ICD-10-CM

## 2021-06-16 DIAGNOSIS — M542 Cervicalgia: Secondary | ICD-10-CM | POA: Diagnosis not present

## 2021-06-16 DIAGNOSIS — R293 Abnormal posture: Secondary | ICD-10-CM | POA: Diagnosis not present

## 2021-06-16 NOTE — Therapy (Signed)
[facility], [place], [address] Phone: [phone]   Fax:  [phone]  Physical Therapy Treatment  Patient Details  Name: Danny Cohen MRN: [id number] Date of Birth: [birth year] Referring Provider (PT): Dr Ophelia Charter   Encounter Date: 06/16/2021   PT End of Session - 06/16/21 1529     Visit Number 4    Number of Visits 12    Date for PT Re-Evaluation 07/14/21    PT Start Time 7741    PT Stop Time 1605    PT Time Calculation (min) 35 min    Activity Tolerance Patient tolerated treatment well             Past Medical History:  Diagnosis Date   Arthritis    Chronic back pain greater than 3 months duration 09/03/2011   Esophageal reflux disease 09/03/2011   Gastric ulcer due to Helicobacter pylori 28/78/6767   Peptic ulcer due to Helicobacter pylori 20/94/7096    Past Surgical History:  Procedure Laterality Date   HERNIA REPAIR     LAPAROSCOPIC INGUINAL HERNIA REPAIR  04/23/2012   bilateral   SHOULDER ARTHROSCOPY WITH SUBACROMIAL DECOMPRESSION AND BICEP TENDON REPAIR Left 05/12/2021   Procedure: SHOULDER ARTHROSCOPY WITH SUBACROMIAL DECOMPRESSION AND BICEP TENDON REPAIR;  Surgeon: Hiram Gash, MD;  Location: [facility];  Service: Orthopedics;  Laterality: Left;    There were no vitals filed for this visit.   Subjective Assessment - 06/16/21 1533     Subjective Patient reports that his shoulder is tender and tight in the anterior shoulder but doing OK.    Currently in Pain? Yes    Pain Score 2     Pain Location Shoulder    Pain Orientation Left    Pain Descriptors / Indicators Aching    Pain Type Chronic pain;Surgical pain                [facility] PT Assessment - 06/16/21 0001       PROM   Left Shoulder Flexion 161 Degrees    Left Shoulder ABduction 154 Degrees   in scapular plane   Left Shoulder External Rotation 83 Degrees   in scapular plane                           [facility] Adult PT Treatment/Exercise - 06/16/21 0001       Shoulder Exercises: Supine   Other Supine Exercises chest lift 10 sec x 10 reps      Shoulder Exercises: Standing   Flexion AAROM;Left;10 reps    Flexion Limitations shoudler flexion at wall bilat 10 sec x 10 reps    Other Standing Exercises scap squeeze 10 sec x 10 reps      Shoulder Exercises: Pulleys   Flexion 2 minutes    Scaption 2 minutes      Shoulder Exercises: ROM/Strengthening   Pendulum fwd/back; side to side; circles CW/CCW x 10-20 each      Shoulder Exercises: Stretch   External Rotation Stretch 10 seconds   10 reps     Manual Therapy   Joint Mobilization pt supine hooklying    Soft tissue mobilization Lt shoulder girdle inc pecs; upper trpa; leveator; biceps; teres/lats    Scapular Mobilization Lt    Passive ROM Lt shoulder flexion; abduction in scapular plane; ER in scapular plane  PT Short Term Goals - 05/19/21 1814       PT SHORT TERM GOAL #1   Title Will be compliant with appropriate HEP    Time 4    Period Weeks    Status New    Target Date 06/16/21      PT SHORT TERM GOAL #2   Title Patient to report decreased post op pain with pain no greater than 2-3/10 on 1-10 scale    Baseline -    Time 4    Period Weeks    Status New    Target Date 06/16/21      PT SHORT TERM GOAL #3   Title Instruct in proper posture and alignment engaging posterior shoulder musculature for all exercises    Time 4    Period Weeks    Status New    Target Date 06/16/21               PT Long Term Goals - 05/19/21 1816       PT LONG TERM GOAL #1   Title Increase PROM/AROM Lt shoulder to >/= AROM Rt shoulder    Time 8    Period Weeks    Status New    Target Date 07/14/21      PT LONG TERM GOAL #2   Title Patient to demonstrate 4/5 strength Lt UE to perform functional activities per protocol    Baseline -    Time 8    Period Weeks     Status New    Target Date 07/14/21      PT LONG TERM GOAL #3   Title Patient reports ability to return to work and leisure activities as indicated    Time 8    Period Weeks    Status New    Target Date 07/14/21      PT LONG TERM GOAL #4   Title Independent in HEP    Time 8    Period Weeks    Status New    Target Date 07/14/21      PT LONG TERM GOAL #5   Title Improve functional score to 54    Baseline -    Time 8    Period Weeks    Status New    Target Date 07/14/21                   Plan - 06/16/21 1534     Clinical Impression Statement --    Rehab Potential --    PT Frequency --    PT Duration --    PT Treatment/Interventions --    PT Next Visit Plan --    PT Home Exercise Plan --    Consulted and Agree with Plan of Care --             Patient will benefit from skilled therapeutic intervention in order to improve the following deficits and impairments:     Visit Diagnosis: Left shoulder pain, unspecified chronicity  Other symptoms and signs involving the musculoskeletal system  Weakness of left upper extremity  Abnormal posture  Cervicalgia     Problem List Patient Active Problem List   Diagnosis Date Noted   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 07/30/2020   Arthritis of first metatarsophalangeal (MTP) joint of right foot 07/02/2020   Occipital neuralgia 03/18/2020   Phlebitis 02/01/2020   Well adult exam 01/23/2020   Cervical radiculopathy 12/22/2019   Rectal bleeding 40/04/6760   Folliculitis  10/13/2019   Hydrocele 09/26/2017   Hyperglycemia 08/26/2014   Hyperkalemia 06/25/2014   Abnormal CT scan, small bowel 04/13/2014   Liver hemangioma 12/16/2012   Urinary frequency 09/02/2012   Bilateral inguinal hernia (BIH) 03/25/2012   Esophageal reflux disease 09/03/2011   Gastric ulcer due to Helicobacter pylori 70/96/2836   Chronic back pain greater than 3 months duration 09/03/2011   Neck pain, chronic  07/06/2011    Pierina Schuknecht Nilda Simmer, PT, MPH  06/16/2021, 4:06 PM  [facility], [place], [address] Phone: [phone]   Fax:  [phone]  Name: Danny Cohen MRN: [id number] Date of Birth: 11-14-1965

## 2021-06-16 NOTE — Progress Notes (Signed)
[facility] Note  06/21/2021     CHIEF COMPLAINT Patient presents for Retina Follow Up   HISTORY OF PRESENT ILLNESS: Danny Cohen is a 55 y.o. male who presents to the clinic today for:  HPI     Retina Follow Up   Patient presents with  Other.  In right eye.  Severity is mild.  Duration of 8 weeks.  Since onset it is stable.  I, the attending physician,  performed the HPI with the patient and updated documentation appropriately.        Comments   Pt here for 8 wk ret f/u for CSR OD, pt late for 3-4 week f/u. Pt states he had placed his last appointment time on PM instead of AM on his calendar, missing his last appointment. He reports not having anymore shoulder steroid injections due to the fact it could be interfering with his vision. He reports vision has cleared up, hasnt had any blurry vision since previous encounter. No ocular paint or discomfort, no gtts currently taken.       Last edited by Bernarda Caffey, MD on 06/21/2021  8:41 AM.    Pt states vision cleared up about a week after he was here last, he is very happy with his vision now  Referring physician: Shirleen Schirmer, [address], [place]  [id number]  HISTORICAL INFORMATION:   Selected notes from the MEDICAL RECORD NUMBER Referred by Shirleen Schirmer, PA-C LEE: 08.15.22 Ocular Hx- Acute CSR OD    CURRENT MEDICATIONS: No current outpatient medications on file. (Ophthalmic Drugs)   No current facility-administered medications for this visit. (Ophthalmic Drugs)   Current Outpatient Medications (Other)  Medication Sig   celecoxib (CELEBREX) 100 MG capsule 1-2 tabs scheduled daily for 2 weeks then 1-2 tabs as needed daily.   doxycycline (VIBRA-TABS) 100 MG tablet TAKE 1 TABLET BY MOUTH EVERY DAY   esomeprazole (NEXIUM) 40 MG capsule TAKE AT THE SAME TIME IS TAKING CELEBREX (1-2 TIMES DAILY)   methocarbamol (ROBAXIN) 500 MG tablet Take 1 tablet (500 mg total) by  mouth every 8 (eight) hours as needed for muscle spasms.   pregabalin (LYRICA) 50 MG capsule Take 1 capsule (50 mg total) by mouth 2 (two) times daily.   sucralfate (CARAFATE) 1 g tablet TAKE 1 TABLET (1 G TOTAL) BY MOUTH 4 (FOUR) TIMES DAILY - WITH MEALS AND AT BEDTIME.   No current facility-administered medications for this visit. (Other)   REVIEW OF SYSTEMS: ROS   Positive for: Gastrointestinal, Neurological, Skin, Musculoskeletal Negative for: Constitutional, Genitourinary, HENT, Endocrine, Cardiovascular, Eyes, Respiratory, Psychiatric, Allergic/Imm, Heme/Lymph Last edited by Kingsley Spittle, COT on 06/21/2021  7:59 AM.    ALLERGIES Allergies  Allergen Reactions   Nsaids Other (See Comments)    Pt has history of peptic ulcer    PAST MEDICAL HISTORY Past Medical History:  Diagnosis Date   Arthritis    Chronic back pain greater than 3 months duration 09/03/2011   Esophageal reflux disease 09/03/2011   Gastric ulcer due to Helicobacter pylori 25/00/3704   Peptic ulcer due to Helicobacter pylori 88/89/1694   Past Surgical History:  Procedure Laterality Date   HERNIA REPAIR     LAPAROSCOPIC INGUINAL HERNIA REPAIR  04/23/2012   bilateral   SHOULDER ARTHROSCOPY WITH SUBACROMIAL DECOMPRESSION AND BICEP TENDON REPAIR Left 05/12/2021   Procedure: SHOULDER ARTHROSCOPY WITH SUBACROMIAL DECOMPRESSION AND BICEP TENDON REPAIR;  Surgeon: Hiram Gash, MD;  Location: [facility];  Service: Orthopedics;  Laterality: Left;   FAMILY HISTORY Family History  Problem Relation Age of Onset   Heart failure Mother    SOCIAL HISTORY Social History   Tobacco Use   Smoking status: Former    Types: Cigarettes    Quit date: 09/11/2000    Years since quitting: 20.7   Smokeless tobacco: Never  Substance Use Topics   Alcohol use: No   Drug use: No       OPHTHALMIC EXAM: Base Eye Exam     Visual Acuity (Snellen - Linear)       Right Left   Dist cc 20/20 20/20     Correction: Glasses         Tonometry (Tonopen, 8:10 AM)       Right Left   Pressure 10 13         Pupils       Dark Light Shape React APD   Right 3 2  Round Brisk None   Left 3 2 Round Brisk None         Visual Fields (Counting fingers)       Left Right    Full Full         Extraocular Movement       Right Left    Full, Ortho Full, Ortho         Neuro/Psych     Oriented x3: Yes   Mood/Affect: Normal         Dilation     Both eyes: 1.0% Mydriacyl, 2.5% Phenylephrine @ 8:10 AM           Slit Lamp and Fundus Exam     Slit Lamp Exam       Right Left   Lids/Lashes Dermatochalasis - upper lid, mild Telangiectasia Dermatochalasis - upper lid, mild Telangiectasia   Conjunctiva/Sclera White and quiet White and quiet   Cornea Clear Clear   Anterior Chamber Deep and quiet Deep and quiet   Iris Round and dilated Round and dilated   Lens 2+ Nuclear sclerosis, 2+ Cortical cataract 2+ Nuclear sclerosis, 2+ Cortical cataract   Vitreous Vitreous syneresis Vitreous syneresis         Fundus Exam       Right Left   Disc Pink and Sharp, Compact Pink and Sharp, Compact   C/D Ratio 0.2 0.2   Macula Blunted foveal reflex, mild RPE mottling, central SRF resolved, no heme or edema Flat, Good foveal reflex, mild RPE mottling, No heme or edema   Vessels mild attenuation, mild tortuousity mild attenuation, mild tortuousity   Periphery Attached, peripheral cystoid degeneration Attached              Refraction     Wearing Rx       Sphere Cylinder Axis Add   Right -2.75 +1.00 078 +2.00   Left -3.25 +1.25 096 +2.00           IMAGING AND PROCEDURES  Imaging and Procedures for 06/21/2021  OCT, Retina - OU - Both Eyes       Right Eye Quality was good. Central Foveal Thickness: 294. Progression has improved. Findings include no IRF, no SRF, normal foveal contour, retinal drusen , vitreomacular adhesion (Interval resolution of central SRF, low PED  now more drusen like in appearance).   Left Eye Quality was good. Central Foveal Thickness: 302. Progression has been stable. Findings include normal foveal contour, no IRF, no SRF, vitreomacular adhesion .   Notes *  Images captured and stored on drive  Diagnosis / Impression:  OD: Interval resolution of central SRF, low PED now more drusen like in appearance OS: NFP, no IRF/SRF  Clinical management:  See below  Abbreviations: NFP - Normal foveal profile. CME - cystoid macular edema. PED - pigment epithelial detachment. IRF - intraretinal fluid. SRF - subretinal fluid. EZ - ellipsoid zone. ERM - epiretinal membrane. ORA - outer retinal atrophy. ORT - outer retinal tubulation. SRHM - subretinal hyper-reflective material. IRHM - intraretinal hyper-reflective material             ASSESSMENT/PLAN:    ICD-10-CM   1. Central serous chorioretinopathy of right eye  H35.711     2. Retinal edema  H35.81 OCT, Retina - OU - Both Eyes    3. Essential hypertension  I10     4. Hypertensive retinopathy of both eyes  H35.033     5. Combined forms of age-related cataract of both eyes  H25.813      1,2. Central Serous Chorioretinopathy OD  - OCT shows Interval resolution of central SRF, low PED now more drusen like in appearance  - BCVA improved to 20/20 today from 20/25  - FA shows very mild staining and leakage  - pt reports significant stressors -- decreased vision occurred while at a stressful week-long training seminar for work; pt also had heart palpitations on the Sunday before the conference and presented to ED  - pt reports receiving injections in his back for bulging discs, which may be contributory  - pt reports vision improved back to baseline about 1 wk after last visit - pt is cleared from a retina standpoint for release to [employer], PA-C and resumption of primary eye care - pt can f/u here PRN  3,4. Hypertensive retinopathy OU - discussed importance of tight BP  control - monitor  5. Mixed Cataract OU - The symptoms of cataract, surgical options, and treatments and risks were discussed with patient. - discussed diagnosis and progression - not yet visually significant - monitor for now  Ophthalmic Meds Ordered this visit:  No orders of the defined types were placed in this encounter.     Return if symptoms worsen or fail to improve.  There are no Patient Instructions on file for this visit.   Explained the diagnoses, plan, and follow up with the patient and they expressed understanding.  Patient expressed understanding of the importance of proper follow up care.   This document serves as a record of services personally performed by Gardiner Sleeper, MD, PhD. It was created on their behalf by Estill Bakes, COT an ophthalmic technician. The creation of this record is the provider's dictation and/or activities during the visit.    Electronically signed by: Estill Bakes, COT 10.6.22 @ 8:45 AM   This document serves as a record of services personally performed by Gardiner Sleeper, MD, PhD. It was created on their behalf by San Jetty. Owens Shark, OA an ophthalmic technician. The creation of this record is the provider's dictation and/or activities during the visit.    Electronically signed by: San Jetty. [place], [place] 10.11.2022 8:45 AM   Gardiner Sleeper, M.D., Ph.D. Diseases & Surgery of the Retina and Vitreous [facility]  I have reviewed the above documentation for accuracy and completeness, and I agree with the above. Gardiner Sleeper, M.D., Ph.D. 06/21/21 8:45 AM  Abbreviations: M myopia (nearsighted); A astigmatism; H hyperopia (farsighted); P presbyopia; Mrx spectacle prescription;  CTL contact lenses; OD right eye; OS left eye; OU both eyes  XT exotropia; ET esotropia; PEK punctate epithelial keratitis; PEE punctate epithelial erosions; DES dry eye syndrome; MGD meibomian gland dysfunction; ATs artificial tears; PFAT's  preservative free artificial tears; Dixon nuclear sclerotic cataract; PSC posterior subcapsular cataract; ERM epi-retinal membrane; PVD posterior vitreous detachment; RD retinal detachment; DM diabetes mellitus; DR diabetic retinopathy; NPDR non-proliferative diabetic retinopathy; PDR proliferative diabetic retinopathy; CSME clinically significant macular edema; DME diabetic macular edema; dbh dot blot hemorrhages; CWS cotton wool spot; POAG primary open angle glaucoma; C/D cup-to-disc ratio; HVF humphrey visual field; GVF goldmann visual field; OCT optical coherence tomography; IOP intraocular pressure; BRVO Branch retinal vein occlusion; CRVO central retinal vein occlusion; CRAO central retinal artery occlusion; BRAO branch retinal artery occlusion; RT retinal tear; SB scleral buckle; PPV pars plana vitrectomy; VH Vitreous hemorrhage; PRP panretinal laser photocoagulation; IVK intravitreal kenalog; VMT vitreomacular traction; MH Macular hole;  NVD neovascularization of the disc; NVE neovascularization elsewhere; AREDS age related eye disease study; ARMD age related macular degeneration; POAG primary open angle glaucoma; EBMD epithelial/anterior basement membrane dystrophy; ACIOL anterior chamber intraocular lens; IOL intraocular lens; PCIOL posterior chamber intraocular lens; Phaco/IOL phacoemulsification with intraocular lens placement; [place] photorefractive keratectomy; LASIK laser assisted in situ keratomileusis; HTN hypertension; DM diabetes mellitus; COPD chronic obstructive pulmonary disease

## 2021-06-21 ENCOUNTER — Other Ambulatory Visit: Payer: Self-pay

## 2021-06-21 ENCOUNTER — Ambulatory Visit (INDEPENDENT_AMBULATORY_CARE_PROVIDER_SITE_OTHER): Payer: No Typology Code available for payment source | Admitting: Ophthalmology

## 2021-06-21 ENCOUNTER — Encounter (INDEPENDENT_AMBULATORY_CARE_PROVIDER_SITE_OTHER): Payer: Self-pay | Admitting: Ophthalmology

## 2021-06-21 DIAGNOSIS — I10 Essential (primary) hypertension: Secondary | ICD-10-CM | POA: Diagnosis not present

## 2021-06-21 DIAGNOSIS — H35711 Central serous chorioretinopathy, right eye: Secondary | ICD-10-CM | POA: Diagnosis not present

## 2021-06-21 DIAGNOSIS — H25813 Combined forms of age-related cataract, bilateral: Secondary | ICD-10-CM | POA: Diagnosis not present

## 2021-06-21 DIAGNOSIS — H35033 Hypertensive retinopathy, bilateral: Secondary | ICD-10-CM

## 2021-06-21 DIAGNOSIS — H3581 Retinal edema: Secondary | ICD-10-CM

## 2021-06-30 ENCOUNTER — Other Ambulatory Visit: Payer: Self-pay

## 2021-06-30 ENCOUNTER — Encounter: Payer: Self-pay | Admitting: Rehabilitative and Restorative Service Providers"

## 2021-06-30 ENCOUNTER — Ambulatory Visit (INDEPENDENT_AMBULATORY_CARE_PROVIDER_SITE_OTHER): Payer: No Typology Code available for payment source | Admitting: Rehabilitative and Restorative Service Providers"

## 2021-06-30 DIAGNOSIS — M542 Cervicalgia: Secondary | ICD-10-CM

## 2021-06-30 DIAGNOSIS — R293 Abnormal posture: Secondary | ICD-10-CM | POA: Diagnosis not present

## 2021-06-30 DIAGNOSIS — M25512 Pain in left shoulder: Secondary | ICD-10-CM | POA: Diagnosis not present

## 2021-06-30 DIAGNOSIS — R29898 Other symptoms and signs involving the musculoskeletal system: Secondary | ICD-10-CM | POA: Diagnosis not present

## 2021-06-30 NOTE — Therapy (Signed)
[facility], [place], [address] Phone: [phone]   Fax:  [phone]  Physical Therapy Treatment  Patient Details  Name: Danny Cohen. Goates MRN: [id number] Date of Birth: 24-Jun-1966 Referring Provider (PT): Dr Ophelia Charter   Encounter Date: 06/30/2021   PT End of Session - 06/30/21 0849     Visit Number 5    Number of Visits 12    Date for PT Re-Evaluation 07/14/21    PT Start Time 0845    PT Stop Time 0930    PT Time Calculation (min) 45 min    Activity Tolerance Patient tolerated treatment well             Past Medical History:  Diagnosis Date   Arthritis    Chronic back pain greater than 3 months duration 09/03/2011   Esophageal reflux disease 09/03/2011   Gastric ulcer due to Helicobacter pylori 69/62/9528   Peptic ulcer due to Helicobacter pylori 41/32/4401    Past Surgical History:  Procedure Laterality Date   HERNIA REPAIR     LAPAROSCOPIC INGUINAL HERNIA REPAIR  04/23/2012   bilateral   SHOULDER ARTHROSCOPY WITH SUBACROMIAL DECOMPRESSION AND BICEP TENDON REPAIR Left 05/12/2021   Procedure: SHOULDER ARTHROSCOPY WITH SUBACROMIAL DECOMPRESSION AND BICEP TENDON REPAIR;  Surgeon: Hiram Gash, MD;  Location: [facility];  Service: Orthopedics;  Laterality: Left;    There were no vitals filed for this visit.   Subjective Assessment - 06/30/21 0852     Subjective Some soreness in the Lt shoulder - probably using arm too much at work and at home. Out of town next week on vacation to [place].    Currently in Pain? Yes    Pain Score 5     Pain Location Shoulder    Pain Orientation Left    Pain Descriptors / Indicators Aching    Pain Type Chronic pain;Surgical pain                [facility] PT Assessment - 06/30/21 0001       Assessment   Medical Diagnosis Lt shoulder SAD; biceps tendon repair    Referring Provider (PT) Dr Ophelia Charter    Onset Date/Surgical Date  05/12/21    Hand Dominance Right    Next MD Visit 05/20/21    Prior Therapy here for neck and shoulder      AROM   Right Shoulder Extension 40 Degrees    Right Shoulder Flexion 151 Degrees    Right Shoulder ABduction 148 Degrees    Right Shoulder External Rotation 90 Degrees    Left Shoulder Extension 36 Degrees    Left Shoulder Flexion 143 Degrees    Left Shoulder ABduction 94 Degrees    Left Shoulder Internal Rotation --   thumb T12   Left Shoulder External Rotation 60 Degrees      PROM   Left Shoulder Flexion 158 Degrees    Left Shoulder ABduction 7 Degrees   in scapular plane   Left Shoulder External Rotation 6283 Degrees   in scapular plane     Palpation   Palpation comment muscular tightness Lt shoulder girdle                           [facility] Adult PT Treatment/Exercise - 06/30/21 0001       Shoulder Exercises: Supine   Other Supine Exercises chest lift 10 sec x 10 reps  Shoulder Exercises: Standing   External Rotation Strengthening;Left;10 reps;Theraband    Theraband Level (Shoulder External Rotation) Level 3 (Green)    External Rotation Limitations reactive isometric step out    Internal Rotation Strengthening;Left;10 reps;Theraband    Theraband Level (Shoulder Internal Rotation) Level 3 (Green)    Internal Rotation Limitations reactive isometric step out    Flexion AAROM;Left;10 reps    Flexion Limitations shoulder flexion at wall bilat 10 sec x 10 reps    Extension Strengthening;Both;20 reps;Theraband    Theraband Level (Shoulder Extension) Level 3 (Green)    Row Strengthening;Both;10 reps;Theraband    Theraband Level (Shoulder Row) Level 3 (Green)    Retraction Strengthening;Both;10 reps;Theraband    Theraband Level (Shoulder Retraction) Level 2 (Red)    Other Standing Exercises scap squeeze 10 sec x 10 reps    Other Standing Exercises biceps curl 2# x 10 bilat      Shoulder Exercises: Pulleys   Flexion 2 minutes    Scaption 2 minutes       Shoulder Exercises: Stretch   Other Shoulder Stretches biceps stretch 20 sec x 3 reps      Manual Therapy   Joint Mobilization pt supine hooklying    Soft tissue mobilization Lt shoulder girdle inc pecs; upper trpa; leveator; biceps; teres/lats    Scapular Mobilization Lt    Passive ROM Lt shoulder flexion; abduction in scapular plane; ER in scapular plane                     PT Education - 06/30/21 0914     Education Details HEP    Person(s) Educated Patient    Methods Explanation;Demonstration;Tactile cues;Verbal cues;Handout    Comprehension Verbalized understanding;Returned demonstration;Verbal cues required;Tactile cues required              PT Short Term Goals - 05/19/21 1814       PT SHORT TERM GOAL #1   Title Will be compliant with appropriate HEP    Time 4    Period Weeks    Status New    Target Date 06/16/21      PT SHORT TERM GOAL #2   Title Patient to report decreased post op pain with pain no greater than 2-3/10 on 1-10 scale    Baseline -    Time 4    Period Weeks    Status New    Target Date 06/16/21      PT SHORT TERM GOAL #3   Title Instruct in proper posture and alignment engaging posterior shoulder musculature for all exercises    Time 4    Period Weeks    Status New    Target Date 06/16/21               PT Long Term Goals - 05/19/21 1816       PT LONG TERM GOAL #1   Title Increase PROM/AROM Lt shoulder to >/= AROM Rt shoulder    Time 8    Period Weeks    Status New    Target Date 07/14/21      PT LONG TERM GOAL #2   Title Patient to demonstrate 4/5 strength Lt UE to perform functional activities per protocol    Baseline -    Time 8    Period Weeks    Status New    Target Date 07/14/21      PT LONG TERM GOAL #3   Title Patient reports ability to return to work  and leisure activities as indicated    Time 8    Period Weeks    Status New    Target Date 07/14/21      PT LONG TERM GOAL #4   Title  Independent in HEP    Time 8    Period Weeks    Status New    Target Date 07/14/21      PT LONG TERM GOAL #5   Title Improve functional score to 54    Baseline -    Time 8    Period Weeks    Status New    Target Date 07/14/21                   Plan - 06/30/21 0858     Clinical Impression Statement Continued pain and discomfort lilely related to functional activities and use of Lt UE. Added resistive exercises without difficulty. Working toward rehab goals.    Rehab Potential Good    PT Frequency 2x / week    PT Duration 8 weeks    PT Treatment/Interventions ADLs/Self Care Home Management;Aquatic Therapy;Cryotherapy;Electrical Stimulation;Iontophoresis 4mg /ml Dexamethasone;Moist Heat;Ultrasound;Functional mobility training;Therapeutic exercise;Neuromuscular re-education;Patient/family education;Manual techniques;Passive range of motion;Dry needling;Taping;Vasopneumatic Device    PT Next Visit Plan progress AROM per protocol, manual and modalities as indicated    PT Home Exercise Plan [email or web address]    Consulted and Agree with Plan of Care Patient             Patient will benefit from skilled therapeutic intervention in order to improve the following deficits and impairments:     Visit Diagnosis: Left shoulder pain, unspecified chronicity  Other symptoms and signs involving the musculoskeletal system  Weakness of left upper extremity  Abnormal posture  Cervicalgia     Problem List Patient Active Problem List   Diagnosis Date Noted   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 07/30/2020   Arthritis of first metatarsophalangeal (MTP) joint of right foot 07/02/2020   Occipital neuralgia 03/18/2020   Phlebitis 02/01/2020   Well adult exam 01/23/2020   Cervical radiculopathy 12/22/2019   Rectal bleeding 99/83/3825   Folliculitis 05/39/7673   Hydrocele 09/26/2017   Hyperglycemia 08/26/2014   Hyperkalemia 06/25/2014   Abnormal CT scan,  small bowel 04/13/2014   Liver hemangioma 12/16/2012   Urinary frequency 09/02/2012   Bilateral inguinal hernia (BIH) 03/25/2012   Esophageal reflux disease 09/03/2011   Gastric ulcer due to Helicobacter pylori 41/93/7902   Chronic back pain greater than 3 months duration 09/03/2011   Neck pain, chronic 07/06/2011    Jadamarie Butson Nilda Simmer, PT MPH  06/30/2021, 9:33 AM  [facility] [address] [place], [place], [address] Phone: [phone]   Fax:  [phone]  Name: Abimael Zeiter. Nack MRN: [id number] Date of Birth: 1965-10-02

## 2021-06-30 NOTE — Patient Instructions (Addendum)
  Access Code: [email or web address] URL: [email or web address] Date: 06/30/2021 Prepared by: Gillermo Murdoch  Exercises Circular Shoulder Pendulum with Table Support - 3-4 x daily - 7 x weekly - 1 sets - 20-30 reps Seated Shoulder Flexion Towel Slide at Table Top Full Range of Motion - 2 x daily - 7 x weekly - 1 sets - 5-10 reps - 10sec hold Seated Scapular Retraction - 2 x daily - 7 x weekly - 1-2 sets - 10 reps - 10 sec hold Seated Shoulder External Rotation AAROM with Cane and Hand in Neutral - 1 x daily - 7 x weekly - 1 sets - 5-10 reps - 5-10 sec hold Shoulder Flexion Wall Slide with Towel - 1 x daily - 7 x weekly - 1 sets - 5-10 reps - 10 sec hold Supine Scapular Retraction - 2 x daily - 7 x weekly - 1 sets - 10 reps - 5-10 sec hold Shoulder External Rotation and Scapular Retraction with Resistance - 1 x daily - 7 x weekly - 3 sets - 10 reps Scapular Retraction with Resistance - 1 x daily - 7 x weekly - 3 sets - 10 reps Scapular Retraction with Resistance Advanced - 1 x daily - 7 x weekly - 3 sets - 10 reps Drawing Bow - 1 x daily - 7 x weekly - 1 sets - 10 reps - 3 sec hold Shoulder External Rotation Reactive Isometrics - 1 x daily - 7 x weekly - 1 sets - 10 reps - 3 sec hold Shoulder Internal Rotation Reactive Isometrics - 1 x daily - 7 x weekly - 1 sets - 10 reps - 3-5 sec hold Standing Bicep Curl with Dumbbells - 1 x daily - 7 x weekly - 1 sets - 10 reps - 3 sec hold Bicep Stretch at Table - 2 x daily - 7 x weekly - 1 sets - 3 reps - 30 sec hold

## 2021-07-20 ENCOUNTER — Ambulatory Visit (INDEPENDENT_AMBULATORY_CARE_PROVIDER_SITE_OTHER): Payer: No Typology Code available for payment source | Admitting: Rehabilitative and Restorative Service Providers"

## 2021-07-20 ENCOUNTER — Other Ambulatory Visit: Payer: Self-pay

## 2021-07-20 ENCOUNTER — Encounter: Payer: Self-pay | Admitting: Rehabilitative and Restorative Service Providers"

## 2021-07-20 DIAGNOSIS — M25512 Pain in left shoulder: Secondary | ICD-10-CM

## 2021-07-20 DIAGNOSIS — R293 Abnormal posture: Secondary | ICD-10-CM | POA: Diagnosis not present

## 2021-07-20 DIAGNOSIS — R29898 Other symptoms and signs involving the musculoskeletal system: Secondary | ICD-10-CM

## 2021-07-20 NOTE — Therapy (Signed)
[place], [place], [address] Phone: [phone]   Fax:  [phone]  Physical Therapy Treatment  Patient Details  Name: Danny Cohen MRN: [id number] Date of Birth: 12-16-1965 Referring Provider (PT): Dr Ophelia Charter   Encounter Date: 07/20/2021   PT End of Session - 07/20/21 0846     Visit Number 6    Number of Visits 12    Date for PT Re-Evaluation 07/14/21    PT Start Time 0800    PT Stop Time 0850    PT Time Calculation (min) 50 min             Past Medical History:  Diagnosis Date   Arthritis    Chronic back pain greater than 3 months duration 09/03/2011   Esophageal reflux disease 09/03/2011   Gastric ulcer due to Helicobacter pylori 18/29/9371   Peptic ulcer due to Helicobacter pylori 69/67/8938    Past Surgical History:  Procedure Laterality Date   HERNIA REPAIR     LAPAROSCOPIC INGUINAL HERNIA REPAIR  04/23/2012   bilateral   SHOULDER ARTHROSCOPY WITH SUBACROMIAL DECOMPRESSION AND BICEP TENDON REPAIR Left 05/12/2021   Procedure: SHOULDER ARTHROSCOPY WITH SUBACROMIAL DECOMPRESSION AND BICEP TENDON REPAIR;  Surgeon: Hiram Gash, MD;  Location: [facility];  Service: Orthopedics;  Laterality: Left;    There were no vitals filed for this visit.   Subjective Assessment - 07/20/21 0801     Subjective Some soreness in the Lt shoulder - probably using arm too much at work and at home. Was in [place] last week with his family. Saw Dr Rich Fuchs PA yesterday and they have Ok'ed DN for some muscular tightness. Also started a muscle relaxant.    Currently in Pain? No/denies    Pain Score 0-No pain    Pain Location Shoulder                [facility] PT Assessment - 07/20/21 0001       Assessment   Medical Diagnosis Lt shoulder SAD; biceps tendon repair    Referring Provider (PT) Dr Ophelia Charter    Onset Date/Surgical Date 05/12/21    Hand Dominance Right    Next MD  Visit 05/20/21    Prior Therapy here for neck and shoulder      PROM   Left Shoulder Flexion 161 Degrees    Left Shoulder ABduction 162 Degrees   in scapular plane   Left Shoulder External Rotation 83 Degrees   in scapular plane     Palpation   Palpation comment muscular tightness Lt shoulder pecs; biceps; anterior deltoid; upper trap; leveator                           [facility] Adult PT Treatment/Exercise - 07/20/21 0001       Shoulder Exercises: Supine   Other Supine Exercises chest lift 10 sec x 10 reps      Shoulder Exercises: Standing   External Rotation Strengthening;Left;10 reps;Theraband    Theraband Level (Shoulder External Rotation) Level 3 (Green)    External Rotation Limitations reactive isometric step out    Internal Rotation Strengthening;Left;10 reps;Theraband    Theraband Level (Shoulder Internal Rotation) Level 3 (Green)    Internal Rotation Limitations reactive isometric step out    Flexion AAROM;Left;10 reps    Flexion Limitations shoulder flexion at wall bilat 10 sec x 10 reps    Extension Strengthening;Both;20 reps;Theraband  Theraband Level (Shoulder Extension) Level 3 (Green)    Row Strengthening;Both;10 reps;Theraband    Theraband Level (Shoulder Row) Level 3 (Green)      Shoulder Exercises: ROM/Strengthening   UBE (Upper Arm Bike) L3 x 3 min 1.5 min fwd/back    Pendulum fwd/back; side to side; circles CW/CCW x 10-20 each    Other ROM/Strengthening Exercises flexion on wall sliding arms up the wall and then lifting off while facing the wall x 10 reps 3 sec hold      Moist Heat Therapy   Number Minutes Moist Heat 10 Minutes    Moist Heat Location Shoulder      Electrical Stimulation   Electrical Stimulation Location Lt shoulder    Electrical Stimulation Action mAmp x 5 min; microcurrent x 5 min    Electrical Stimulation Parameters to tolerance    Electrical Stimulation Goals Pain;Tone      Manual Therapy   Manual therapy comments  skilled palpation and monitoring of soft tissue during DN    Soft tissue mobilization Lt shoulder girdle inc pecs; upper trpa; leveator; biceps; teres/lats    Scapular Mobilization Lt    Passive ROM Lt shoulder flexion; abduction in scapular plane; ER in scapular plane              Trigger Point Dry Needling - 07/20/21 0001     Consent Given? Yes    Education Handout Provided Yes    Electrical Stimulation Performed with Dry Needling Yes    Other Dry Needling Lt    Upper Trapezius Response Palpable increased muscle length;Twitch reponse elicited    Pectoralis Major Response Palpable increased muscle length;Twitch response elicited    Pectoralis Minor Response Palpable increased muscle length;Twitch response elicited    Deltoid Response Palpable increased muscle length;Twitch response elicited    Biceps Response Palpable increased muscle length;Twitch response elicited                   PT Education - 07/20/21 0821     Education Details HEP    Person(s) Educated Patient    Methods Explanation;Handout    Comprehension Verbalized understanding                        Plan - 07/20/21 0817     Clinical Impression Statement Persistent muscular tightness in Lt pecs; biceps; deltoid; upper trap. Trial of DN and continued manual work through muscular tightness with good release of palpable tightness. Patient is continuing with ROM and strengthening exercises. He continues to work and has driven [place] which likely increases tightness and pain in the Lt shoulder.  Gradually progressing toward stated goals of therapy.    Rehab Potential Good    PT Frequency 2x / week    PT Duration 8 weeks    PT Treatment/Interventions ADLs/Self Care Home Management;Aquatic Therapy;Cryotherapy;Electrical Stimulation;Iontophoresis 4mg /ml Dexamethasone;Moist Heat;Ultrasound;Functional mobility training;Therapeutic exercise;Neuromuscular re-education;Patient/family  education;Manual techniques;Passive range of motion;Dry needling;Taping;Vasopneumatic Device    PT Next Visit Plan progress AROM per protocol, manual and modalities as indicated; assess response to DN and manual work    PT Home Exercise Plan [email or web address]    Consulted and Agree with Plan of Care Patient             Patient will benefit from skilled therapeutic intervention in order to improve the following deficits and impairments:     Visit Diagnosis: Left shoulder pain, unspecified chronicity  Other symptoms and signs involving the musculoskeletal  system  Weakness of left upper extremity  Abnormal posture     Problem List Patient Active Problem List   Diagnosis Date Noted   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 07/30/2020   Arthritis of first metatarsophalangeal (MTP) joint of right foot 07/02/2020   Occipital neuralgia 03/18/2020   Phlebitis 02/01/2020   Well adult exam 01/23/2020   Cervical radiculopathy 12/22/2019   Rectal bleeding 74/25/5258   Folliculitis 94/83/4758   Hydrocele 09/26/2017   Hyperglycemia 08/26/2014   Hyperkalemia 06/25/2014   Abnormal CT scan, small bowel 04/13/2014   Liver hemangioma 12/16/2012   Urinary frequency 09/02/2012   Bilateral inguinal hernia (BIH) 03/25/2012   Esophageal reflux disease 09/03/2011   Gastric ulcer due to Helicobacter pylori 30/74/6002   Chronic back pain greater than 3 months duration 09/03/2011   Neck pain, chronic 07/06/2011    Hailea Eaglin Nilda Simmer, PT, MPH  07/20/2021, 8:54 AM  [facility] [address] [place], [place], [address] Phone: [phone]   Fax:  [phone]  Name: Danny Cohen MRN: [id number] Date of Birth: 09/29/1965

## 2021-07-20 NOTE — Patient Instructions (Signed)

## 2021-07-27 ENCOUNTER — Ambulatory Visit (INDEPENDENT_AMBULATORY_CARE_PROVIDER_SITE_OTHER): Payer: No Typology Code available for payment source | Admitting: Rehabilitative and Restorative Service Providers"

## 2021-07-27 ENCOUNTER — Other Ambulatory Visit: Payer: Self-pay

## 2021-07-27 ENCOUNTER — Encounter: Payer: Self-pay | Admitting: Rehabilitative and Restorative Service Providers"

## 2021-07-27 DIAGNOSIS — M25512 Pain in left shoulder: Secondary | ICD-10-CM

## 2021-07-27 DIAGNOSIS — M542 Cervicalgia: Secondary | ICD-10-CM | POA: Diagnosis not present

## 2021-07-27 DIAGNOSIS — R29898 Other symptoms and signs involving the musculoskeletal system: Secondary | ICD-10-CM | POA: Diagnosis not present

## 2021-07-27 DIAGNOSIS — R293 Abnormal posture: Secondary | ICD-10-CM

## 2021-07-27 NOTE — Patient Instructions (Signed)
Access Code: [email or web address] URL: [email or web address] Date: 07/27/2021 Prepared by: Gillermo Murdoch  Exercises Circular Shoulder Pendulum with Table Support - 3-4 x daily - 7 x weekly - 1 sets - 20-30 reps Seated Shoulder Flexion Towel Slide at Table Top Full Range of Motion - 2 x daily - 7 x weekly - 1 sets - 5-10 reps - 10sec hold Seated Scapular Retraction - 2 x daily - 7 x weekly - 1-2 sets - 10 reps - 10 sec hold Seated Shoulder External Rotation AAROM with Cane and Hand in Neutral - 1 x daily - 7 x weekly - 1 sets - 5-10 reps - 5-10 sec hold Shoulder Flexion Wall Slide with Towel - 1 x daily - 7 x weekly - 1 sets - 5-10 reps - 10 sec hold Supine Scapular Retraction - 2 x daily - 7 x weekly - 1 sets - 10 reps - 5-10 sec hold Shoulder External Rotation and Scapular Retraction with Resistance - 1 x daily - 7 x weekly - 3 sets - 10 reps Scapular Retraction with Resistance - 1 x daily - 7 x weekly - 3 sets - 10 reps Scapular Retraction with Resistance Advanced - 1 x daily - 7 x weekly - 3 sets - 10 reps Drawing Bow - 1 x daily - 7 x weekly - 1 sets - 10 reps - 3 sec hold Shoulder External Rotation Reactive Isometrics - 1 x daily - 7 x weekly - 1 sets - 10 reps - 3 sec hold Shoulder Internal Rotation Reactive Isometrics - 1 x daily - 7 x weekly - 1 sets - 10 reps - 3-5 sec hold Standing Bicep Curl with Dumbbells - 1 x daily - 7 x weekly - 1 sets - 10 reps - 3 sec hold Bicep Stretch at Table - 2 x daily - 7 x weekly - 1 sets - 3 reps - 30 sec hold Standing Shoulder Flexion Stretch on Wall - 2 x daily - 7 x weekly - 2 sets - 10 reps - 3 sec hold Doorway Pec Stretch at 60 Degrees Abduction - 3 x daily - 7 x weekly - 1 sets - 3 reps Doorway Pec Stretch at 90 Degrees Abduction - 3 x daily - 7 x weekly - 1 sets - 3 reps - 30 seconds hold Doorway Pec Stretch at 120 Degrees Abduction - 3 x daily - 7 x weekly - 1 sets - 3 reps - 30 second hold hold

## 2021-07-27 NOTE — Therapy (Signed)
[place], [place], [address] Phone: [phone]   Fax:  [phone]  Physical Therapy Treatment  Patient Details  Name: Danny Cohen. [place] MRN: [id number] Date of Birth: 06-27-1966 Referring Provider (PT): Dr Ophelia Charter   Encounter Date: 07/27/2021   PT End of Session - 07/27/21 0804     Visit Number 7    Number of Visits 12    Date for PT Re-Evaluation 07/14/21    PT Start Time 0800    PT Stop Time 6712    PT Time Calculation (min) 44 min    Activity Tolerance Patient tolerated treatment well             Past Medical History:  Diagnosis Date   Arthritis    Chronic back pain greater than 3 months duration 09/03/2011   Esophageal reflux disease 09/03/2011   Gastric ulcer due to Helicobacter pylori 45/80/9983   Peptic ulcer due to Helicobacter pylori 38/25/0539    Past Surgical History:  Procedure Laterality Date   HERNIA REPAIR     LAPAROSCOPIC INGUINAL HERNIA REPAIR  04/23/2012   bilateral   SHOULDER ARTHROSCOPY WITH SUBACROMIAL DECOMPRESSION AND BICEP TENDON REPAIR Left 05/12/2021   Procedure: SHOULDER ARTHROSCOPY WITH SUBACROMIAL DECOMPRESSION AND BICEP TENDON REPAIR;  Surgeon: Hiram Gash, MD;  Location: [facility];  Service: Orthopedics;  Laterality: Left;    There were no vitals filed for this visit.   Subjective Assessment - 07/27/21 0804     Subjective Patient reports that he is having continued soreness and pain in the Lt UE . Worst when trying to sleep. Pain with biceps stretch. Other exercises seem to be ok.    Currently in Pain? No/denies    Pain Score 0-No pain    Pain Location Shoulder    Pain Orientation Left    Pain Descriptors / Indicators Sore    Pain Type Chronic pain;Surgical pain                [facility] PT Assessment - 07/27/21 0001       Assessment   Medical Diagnosis Lt shoulder SAD; biceps tendon repair    Referring Provider (PT) Dr  Ophelia Charter    Onset Date/Surgical Date 05/12/21    Hand Dominance Right    Next MD Visit 12/22    Prior Therapy here for neck and shoulder      AROM   Right Shoulder Extension 40 Degrees    Right Shoulder Flexion 151 Degrees    Right Shoulder ABduction 148 Degrees    Right Shoulder External Rotation 90 Degrees    Left Shoulder Extension 36 Degrees    Left Shoulder Flexion 150 Degrees    Left Shoulder ABduction 112 Degrees    Left Shoulder Internal Rotation --   thumb T     Palpation   Palpation comment muscular tightness Lt shoulder pecs; biceps; anterior deltoid; upper trap; leveator                           [facility] Adult PT Treatment/Exercise - 07/27/21 0001       Shoulder Exercises: Supine   Other Supine Exercises chest lift 10 sec x 10 reps      Shoulder Exercises: ROM/Strengthening   UBE (Upper Arm Bike) L3 x 3 min 1.5 min fwd/back      Shoulder Exercises: Stretch   Other Shoulder Stretches biceps stretch 20 sec x  3 reps    Other Shoulder Stretches doorway stretch to patient tolerance 3 positions 15-20 sec hold x 2 reps each position      Electrical Stimulation   Electrical Stimulation Location Lt shoulder    Electrical Stimulation Action mAmp x 5 min; microcurrent x 5 min    Electrical Stimulation Parameters to tolerance    Electrical Stimulation Goals Pain;Tone      Manual Therapy   Manual therapy comments skilled palpation and monitoring of soft tissue during DN    Joint Mobilization GH joint mobs AP and inferior    Soft tissue mobilization Lt shoulder girdle inc pecs; upper trap; leveator; biceps; teres/lats    Scapular Mobilization Lt    Passive ROM Lt shoulder flexion; abduction in scapular plane; ER in scapular plane              Trigger Point Dry Needling - 07/27/21 0001     Consent Given? Yes    Education Handout Provided Previously provided    Printmaker Performed with Dry Needling Yes    Other Dry Needling Lt     Pectoralis Major Response Palpable increased muscle length;Twitch response elicited    Pectoralis Minor Response Palpable increased muscle length;Twitch response elicited    Deltoid Response Palpable increased muscle length;Twitch response elicited    Biceps Response Palpable increased muscle length;Twitch response elicited                   PT Education - 07/27/21 0829     Education Details HEP    Person(s) Educated Patient    Methods Explanation;Demonstration;Tactile cues;Verbal cues;Handout    Comprehension Verbalized understanding;Returned demonstration;Verbal cues required;Tactile cues required                        Plan - 07/27/21 0823     Clinical Impression Statement Continued significant muscular tightness through the pecs and biceps with forward position of shoulder due to tightness. Good response to DN and manual work. Reviewed and added stretches for the pecs and biceps. ROM continues to improve gradually.    Rehab Potential Good    PT Frequency 2x / week    PT Duration 8 weeks    PT Treatment/Interventions ADLs/Self Care Home Management;Aquatic Therapy;Cryotherapy;Electrical Stimulation;Iontophoresis 4mg /ml Dexamethasone;Moist Heat;Ultrasound;Functional mobility training;Therapeutic exercise;Neuromuscular re-education;Patient/family education;Manual techniques;Passive range of motion;Dry needling;Taping;Vasopneumatic Device    PT Next Visit Plan progress AROM per protocol, manual and modalities as indicated; continue DN and manual work    PT Home Exercise Plan [email or web address]    Consulted and Agree with Plan of Care Patient             Patient will benefit from skilled therapeutic intervention in order to improve the following deficits and impairments:     Visit Diagnosis: Left shoulder pain, unspecified chronicity  Other symptoms and signs involving the musculoskeletal system  Weakness of left upper extremity  Abnormal  posture  Cervicalgia     Problem List Patient Active Problem List   Diagnosis Date Noted   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 07/30/2020   Arthritis of first metatarsophalangeal (MTP) joint of right foot 07/02/2020   Occipital neuralgia 03/18/2020   Phlebitis 02/01/2020   Well adult exam 01/23/2020   Cervical radiculopathy 12/22/2019   Rectal bleeding 49/70/2637   Folliculitis 85/88/5027   Hydrocele 09/26/2017   Hyperglycemia 08/26/2014   Hyperkalemia 06/25/2014   Abnormal CT scan, small bowel 04/13/2014   Liver hemangioma 12/16/2012  Urinary frequency 09/02/2012   Bilateral inguinal hernia (BIH) 03/25/2012   Esophageal reflux disease 09/03/2011   Gastric ulcer due to Helicobacter pylori 00/71/2197   Chronic back pain greater than 3 months duration 09/03/2011   Neck pain, chronic 07/06/2011    Jeremi Losito Nilda Simmer, PT, MPH  07/27/2021, 8:44 AM  [facility], [place], [address] Phone: [phone]   Fax:  [phone]  Name: Dawon Troop. Reffett MRN: [id number] Date of Birth: [birth year]

## 2021-08-10 ENCOUNTER — Ambulatory Visit (INDEPENDENT_AMBULATORY_CARE_PROVIDER_SITE_OTHER): Payer: No Typology Code available for payment source | Admitting: Rehabilitative and Restorative Service Providers"

## 2021-08-10 ENCOUNTER — Encounter: Payer: Self-pay | Admitting: Rehabilitative and Restorative Service Providers"

## 2021-08-10 ENCOUNTER — Other Ambulatory Visit: Payer: Self-pay

## 2021-08-10 DIAGNOSIS — M25512 Pain in left shoulder: Secondary | ICD-10-CM

## 2021-08-10 DIAGNOSIS — R29898 Other symptoms and signs involving the musculoskeletal system: Secondary | ICD-10-CM | POA: Diagnosis not present

## 2021-08-10 DIAGNOSIS — R293 Abnormal posture: Secondary | ICD-10-CM | POA: Diagnosis not present

## 2021-08-10 NOTE — Patient Instructions (Addendum)
  Access Code: [email or web address] URL: [email or web address] Date: 08/10/2021 Prepared by: Gillermo Murdoch  Exercises Circular Shoulder Pendulum with Table Support - 3-4 x daily - 7 x weekly - 1 sets - 20-30 reps Seated Shoulder Flexion Towel Slide at Table Top Full Range of Motion - 2 x daily - 7 x weekly - 1 sets - 5-10 reps - 10sec hold Seated Scapular Retraction - 2 x daily - 7 x weekly - 1-2 sets - 10 reps - 10 sec hold Seated Shoulder External Rotation AAROM with Cane and Hand in Neutral - 1 x daily - 7 x weekly - 1 sets - 5-10 reps - 5-10 sec hold Shoulder Flexion Wall Slide with Towel - 1 x daily - 7 x weekly - 1 sets - 5-10 reps - 10 sec hold Supine Scapular Retraction - 2 x daily - 7 x weekly - 1 sets - 10 reps - 5-10 sec hold Shoulder External Rotation and Scapular Retraction with Resistance - 1 x daily - 7 x weekly - 3 sets - 10 reps Scapular Retraction with Resistance - 1 x daily - 7 x weekly - 3 sets - 10 reps Scapular Retraction with Resistance Advanced - 1 x daily - 7 x weekly - 3 sets - 10 reps Drawing Bow - 1 x daily - 7 x weekly - 1 sets - 10 reps - 3 sec hold Shoulder External Rotation Reactive Isometrics - 1 x daily - 7 x weekly - 1 sets - 10 reps - 3 sec hold Shoulder Internal Rotation Reactive Isometrics - 1 x daily - 7 x weekly - 1 sets - 10 reps - 3-5 sec hold Standing Bicep Curl with Dumbbells - 1 x daily - 7 x weekly - 1 sets - 10 reps - 3 sec hold Bicep Stretch at Table - 2 x daily - 7 x weekly - 1 sets - 3 reps - 30 sec hold Standing Shoulder Flexion Stretch on Wall - 2 x daily - 7 x weekly - 2 sets - 10 reps - 3 sec hold Doorway Pec Stretch at 60 Degrees Abduction - 3 x daily - 7 x weekly - 1 sets - 3 reps Doorway Pec Stretch at 90 Degrees Abduction - 3 x daily - 7 x weekly - 1 sets - 3 reps - 30 seconds hold Doorway Pec Stretch at 120 Degrees Abduction - 3 x daily - 7 x weekly - 1 sets - 3 reps - 30 second hold hold Drawing Bow - 1 x daily - 7 x weekly - 1 sets -  10 reps - 3 sec hold Seated Shoulder External Rotation with Resistance - 1 x daily - 7 x weekly - 1 sets - 10 reps - 3 sec hold Standing Single Arm Shoulder External Rotation in Abduction with Anchored Resistance - 1 x daily - 7 x weekly - 1 sets - 10 reps - 3 sec hold Seated Shoulder Internal Rotation with Anchored Resistance - 1 x daily - 7 x weekly - 1 sets - 10 reps - 3 sec hold Supine Chest Stretch with Elbows Bent - 1 x daily - 7 x weekly - 1 sets - 3 reps - 15-30 sec hold Supine Chest Stretch on Foam Roll - 1 x daily - 7 x weekly - 1 sets - 1 reps - 2-5 min sec hold

## 2021-08-10 NOTE — Therapy (Signed)
[place], [place], [address] Phone: [phone]   Fax:  [phone]  Physical Therapy Treatment  Patient Details  Name: Kush Farabee. Stiefel MRN: [id number] Date of Birth: [birth year] Referring Provider (PT): Dr Ophelia Charter   Encounter Date: 08/10/2021   PT End of Session - 08/10/21 0804     Visit Number 8    Number of Visits 14    Date for PT Re-Evaluation 09/21/21    PT Start Time 0802    PT Stop Time 0850    PT Time Calculation (min) 48 min    Activity Tolerance Patient tolerated treatment well             Past Medical History:  Diagnosis Date   Arthritis    Chronic back pain greater than 3 months duration 09/03/2011   Esophageal reflux disease 09/03/2011   Gastric ulcer due to Helicobacter pylori 69/62/9528   Peptic ulcer due to Helicobacter pylori 41/32/4401    Past Surgical History:  Procedure Laterality Date   HERNIA REPAIR     LAPAROSCOPIC INGUINAL HERNIA REPAIR  04/23/2012   bilateral   SHOULDER ARTHROSCOPY WITH SUBACROMIAL DECOMPRESSION AND BICEP TENDON REPAIR Left 05/12/2021   Procedure: SHOULDER ARTHROSCOPY WITH SUBACROMIAL DECOMPRESSION AND BICEP TENDON REPAIR;  Surgeon: Hiram Gash, MD;  Location: [place];  Service: Orthopedics;  Laterality: Left;    There were no vitals filed for this visit.   Subjective Assessment - 08/10/21 0806     Subjective Shoulder is doing OK today. Working on exercises. Continues to have trouble with sleeping. Can't get comfortable with any position for sleep. Rests best with Lt UE resting on Lt hip on right side. Had a day with no pain but pain returned the following day. Still working on exercises regularily.    Currently in Pain? No/denies    Pain Score 0-No pain    Pain Location Shoulder    Pain Orientation Left    Pain Descriptors / Indicators Sore    Pain Type Chronic pain    Pain Onset More than a month ago    Pain  Frequency Intermittent                [facility] PT Assessment - 08/10/21 0001       Assessment   Medical Diagnosis Lt shoulder SAD; biceps tendon repair    Referring Provider (PT) Dr Ophelia Charter    Onset Date/Surgical Date 05/12/21    Hand Dominance Right    Next MD Visit 12/22    Prior Therapy here for neck and shoulder      AROM   Right Shoulder Extension 40 Degrees    Right Shoulder Flexion 160 Degrees    Right Shoulder ABduction 155 Degrees    Right Shoulder Internal Rotation --   thumb T5   Right Shoulder External Rotation 112 Degrees    Left Shoulder Extension 41 Degrees    Left Shoulder Flexion 157 Degrees    Left Shoulder ABduction 131 Degrees    Left Shoulder Internal Rotation --   thumb T10   Left Shoulder External Rotation 76 Degrees      PROM   Left Shoulder Extension 55 Degrees    Left Shoulder Flexion 161 Degrees    Left Shoulder ABduction 167 Degrees   in scapular plane   Left Shoulder External Rotation 90 Degrees   in scapular plane     Strength   Left Shoulder Flexion 4+/5  Left Shoulder Extension 5/5    Left Shoulder ABduction 4+/5    Left Shoulder Internal Rotation 4+/5    Left Shoulder External Rotation 4+/5      Palpation   Palpation comment muscular tightness Lt shoulder pecs; biceps; anterior deltoid; upper trap; leveator                           [facility] Adult PT Treatment/Exercise - 08/10/21 0001       Shoulder Exercises: Supine   Other Supine Exercises chest lift 10 sec x 10 reps      Shoulder Exercises: Standing   External Rotation Strengthening;Left;10 reps;Theraband    Theraband Level (Shoulder External Rotation) Level 4 (Blue)    External Rotation Limitations reactive isometric step out x 10    Internal Rotation Strengthening;Left;10 reps;Theraband    Theraband Level (Shoulder Internal Rotation) Level 4 (Blue)    Internal Rotation Limitations reactive isometric step out x 10    Flexion AAROM;Left;10 reps     Flexion Limitations shoulder flexion at wall bilat 10 sec x 10 reps    Extension Strengthening;Both;20 reps;Theraband    Theraband Level (Shoulder Extension) Level 4 (Blue)    Row Strengthening;Both;10 reps;Theraband    Theraband Level (Shoulder Row) Level 4 (Blue)    Row Limitations step back reactive x 10; bow and arrow x 10 each side blue TB      Shoulder Exercises: ROM/Strengthening   UBE (Upper Arm Bike) L6 x 4 min 2 min fwd/back    Other ROM/Strengthening Exercises flexion on wall sliding arms up the wall and then lifting off while facing the wall x 10 reps 3 sec hold      Shoulder Exercises: Stretch   Other Shoulder Stretches biceps stretch 20 sec x 3 reps    Other Shoulder Stretches doorway stretch to patient tolerance 3 positions 15-20 sec hold x 2 reps each position      Moist Heat Therapy   Number Minutes Moist Heat 10 Minutes    Moist Heat Location Shoulder      Manual Therapy   Joint Mobilization GH joint mobs AP and inferior    Soft tissue mobilization Lt shoulder girdle pecs; upper trap; leveator; biceps; teres/lats    Scapular Mobilization Lt    Passive ROM Lt shoulder flexion; abduction in scapular plane; ER in scapular plane; shoulder extension; horizontal abduction in supine                     PT Education - 08/10/21 0836     Education Details HEP    Person(s) Educated Patient    Methods Explanation;Demonstration;Tactile cues;Verbal cues;Handout    Comprehension Verbalized understanding;Returned demonstration;Verbal cues required;Tactile cues required                 PT Long Term Goals - 08/10/21 0920       PT LONG TERM GOAL #1   Title Increase PROM/AROM Lt shoulder to >/= AROM Rt shoulder    Time 6    Period Weeks    Status Partially Met    Target Date 09/21/21      PT LONG TERM GOAL #2   Title Patient to demonstrate 5/5 strength Lt UE to perform functional activities per protocol    Time 6    Period Weeks    Status New     Target Date 09/21/21      PT LONG TERM GOAL #3   Title  Patient reports ability to return to work, leisure activities,and sleep without limitations and with minimal to no pain.    Time 6    Period Weeks    Status New    Target Date 09/21/21      PT LONG TERM GOAL #4   Title Independent in HEP    Time 6    Period Weeks    Status New    Target Date 09/21/21      PT LONG TERM GOAL #5   Title Improve functional score to 54    Time 6    Period Weeks    Status On-going    Target Date 09/21/21                   Plan - 08/10/21 0810     Clinical Impression Statement Continued significant muscular tightness through the pecs, biceps with forward posture of shoulders bilat due to muscular tightness. Continued end range limitations in Lt shoulder ROM as well as decreased strength Lt shoulder. Intermittent pain continues especially at night limiting sleep. Goals are partially accomplished. Patient will benefit from continued therapy to accomplish maximum rehab potential.    Rehab Potential Good    PT Frequency 2x / week    PT Duration 8 weeks    PT Treatment/Interventions ADLs/Self Care Home Management;Aquatic Therapy;Cryotherapy;Electrical Stimulation;Iontophoresis 32m/ml Dexamethasone;Moist Heat;Ultrasound;Functional mobility training;Therapeutic exercise;Neuromuscular re-education;Patient/family education;Manual techniques;Passive range of motion;Dry needling;Taping;Vasopneumatic Device    PT Next Visit Plan progress AROM per protocol, manual and modalities as indicated; continue DN and manual work    PT Home Exercise Plan [email or web address]    Consulted and Agree with Plan of Care Patient             Patient will benefit from skilled therapeutic intervention in order to improve the following deficits and impairments:     Visit Diagnosis: Left shoulder pain, unspecified chronicity  Other symptoms and signs involving the musculoskeletal system  Weakness of left upper  extremity  Abnormal posture     Problem List Patient Active Problem List   Diagnosis Date Noted   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 07/30/2020   Arthritis of first metatarsophalangeal (MTP) joint of right foot 07/02/2020   Occipital neuralgia 03/18/2020   Phlebitis 02/01/2020   Well adult exam 01/23/2020   Cervical radiculopathy 12/22/2019   Rectal bleeding 043/88/8757  Folliculitis 097/28/2060  Hydrocele 09/26/2017   Hyperglycemia 08/26/2014   Hyperkalemia 06/25/2014   Abnormal CT scan, small bowel 04/13/2014   Liver hemangioma 12/16/2012   Urinary frequency 09/02/2012   Bilateral inguinal hernia (BIH) 03/25/2012   Esophageal reflux disease 09/03/2011   Gastric ulcer due to Helicobacter pylori 115/61/5379  Chronic back pain greater than 3 months duration 09/03/2011   Neck pain, chronic 07/06/2011    Latwan Luchsinger PNilda Simmer PT, MPH  08/10/2021, 9:24 AM  [facility] [address]Phone: [phone]  Fax:  [phone] Name: LHatem Cull LSlagelMRN: [id number] of Birth: 417-Dec-1967

## 2021-08-24 ENCOUNTER — Encounter: Payer: Self-pay | Admitting: Rehabilitative and Restorative Service Providers"

## 2021-08-24 ENCOUNTER — Other Ambulatory Visit: Payer: Self-pay

## 2021-08-24 ENCOUNTER — Ambulatory Visit (INDEPENDENT_AMBULATORY_CARE_PROVIDER_SITE_OTHER): Payer: No Typology Code available for payment source | Admitting: Rehabilitative and Restorative Service Providers"

## 2021-08-24 DIAGNOSIS — M25512 Pain in left shoulder: Secondary | ICD-10-CM | POA: Diagnosis not present

## 2021-08-24 DIAGNOSIS — M542 Cervicalgia: Secondary | ICD-10-CM | POA: Diagnosis not present

## 2021-08-24 DIAGNOSIS — R293 Abnormal posture: Secondary | ICD-10-CM

## 2021-08-24 DIAGNOSIS — R29898 Other symptoms and signs involving the musculoskeletal system: Secondary | ICD-10-CM | POA: Diagnosis not present

## 2021-08-24 NOTE — Therapy (Addendum)
[place], [place], [address] Phone: [phone]   Fax:  [phone]  Physical Therapy Treatment  Patient Details  Name: Danny Cohen MRN: [id number] Date of Birth: December 14, 1965 Referring Provider (PT): Dr Ophelia Charter   Encounter Date: 08/24/2021   PT End of Session - 08/24/21 0801     Visit Number 9    Number of Visits 14    Date for PT Re-Evaluation 09/21/21    PT Start Time 0800    PT Stop Time 0848    PT Time Calculation (min) 48 min    Activity Tolerance Patient tolerated treatment well             Past Medical History:  Diagnosis Date   Arthritis    Chronic back pain greater than 3 months duration 09/03/2011   Esophageal reflux disease 09/03/2011   Gastric ulcer due to Helicobacter pylori 92/33/0076   Peptic ulcer due to Helicobacter pylori 22/63/3354    Past Surgical History:  Procedure Laterality Date   HERNIA REPAIR     LAPAROSCOPIC INGUINAL HERNIA REPAIR  04/23/2012   bilateral   SHOULDER ARTHROSCOPY WITH SUBACROMIAL DECOMPRESSION AND BICEP TENDON REPAIR Left 05/12/2021   Procedure: SHOULDER ARTHROSCOPY WITH SUBACROMIAL DECOMPRESSION AND BICEP TENDON REPAIR;  Surgeon: Hiram Gash, MD;  Location: [place];  Service: Orthopedics;  Laterality: Left;    There were no vitals filed for this visit.   Subjective Assessment - 08/24/21 0802     Subjective Seen by MD and referred for more PT. Patient reports that his insurance company has not approved for more than 9 visits for the rest of the year. Starts over in January. Neurosurgeon did not recommend surgery for cervical spine at this time.    Currently in Pain? Yes    Pain Score 7     Pain Location Shoulder    Pain Orientation Left    Pain Descriptors / Indicators Sore    Pain Type Chronic pain    Pain Onset More than a month ago    Pain Frequency Intermittent    Aggravating Factors  some of the exercises                 [facility] PT Assessment - 08/24/21 0001       Assessment   Medical Diagnosis Lt shoulder SAD; biceps tendon repair    Referring Provider (PT) Dr Ophelia Charter    Onset Date/Surgical Date 05/12/21    Hand Dominance Right    Next MD Visit PRN    Prior Therapy here for neck and shoulder      Palpation   Palpation comment muscular tightness Lt shoulder pecs; biceps; anterior deltoid; upper trap; leveator                           [facility] Adult PT Treatment/Exercise - 08/24/21 0001       Shoulder Exercises: Supine   Other Supine Exercises chest lift 10 sec x 10 reps      Shoulder Exercises: Standing   External Rotation Strengthening;Left;10 reps;Theraband    Theraband Level (Shoulder External Rotation) Level 4 (Blue)    External Rotation Limitations reactive isometric step out x 10    Internal Rotation Strengthening;Left;10 reps;Theraband    Theraband Level (Shoulder Internal Rotation) Level 4 (Blue)    Internal Rotation Limitations reactive isometric step out x 10    Flexion AAROM;Left;10 reps  Flexion Limitations shoulder flexion at wall bilat 10 sec x 10 reps    Extension Strengthening;Both;20 reps;Theraband    Theraband Level (Shoulder Extension) Level 4 (Blue)    Row Strengthening;Both;10 reps;Theraband    Theraband Level (Shoulder Row) Level 4 (Blue)    Row Limitations step back reactive x 10; bow and arrow x 10 each side blue TB      Shoulder Exercises: ROM/Strengthening   UBE (Upper Arm Bike) L4 x 4 min 2 min fwd/back    Other ROM/Strengthening Exercises flexion on wall sliding arms up the wall and then lifting off while facing the wall x 10 reps 3 sec hold      Shoulder Exercises: Stretch   Other Shoulder Stretches biceps stretch 20 sec x 3 reps    Other Shoulder Stretches doorway stretch to patient tolerance 3 positions 15-20 sec hold x 2 reps each position      Moist Heat Therapy   Number Minutes Moist Heat 10 Minutes    Moist Heat  Location Shoulder      Manual Therapy   Manual therapy comments skilled palpation and monitoring of soft tissue during DN    Joint Mobilization GH joint mobs AP and inferior    Soft tissue mobilization Lt shoulder girdle pecs; upper trap; leveator; biceps; teres/lats    Scapular Mobilization Lt    Passive ROM Lt shoulder flexion; abduction in scapular plane; ER in scapular plane; shoulder extension; horizontal abduction in supine    Kinesiotex Inhibit Muscle      Kinesiotix   Inhibit Muscle  correct scapular/shd position; inhibit upper trap              Trigger Point Dry Needling - 08/24/21 0001     Consent Given? Yes    Education Handout Provided Previously provided    Printmaker Performed with Dry Needling Yes    Other Dry Needling Lt    Upper Trapezius Response Palpable increased muscle length    Pectoralis Major Response Palpable increased muscle length;Twitch response elicited    Pectoralis Minor Response Palpable increased muscle length;Twitch response elicited    Supraspinatus Response Palpable increased muscle length    Infraspinatus Response Palpable increased muscle length    Subscapularis Response Palpable increased muscle length    Deltoid Response Palpable increased muscle length;Twitch response elicited    Biceps Response Palpable increased muscle length;Twitch response elicited                        PT Long Term Goals - 08/10/21 0920       PT LONG TERM GOAL #1   Title Increase PROM/AROM Lt shoulder to >/= AROM Rt shoulder    Time 6    Period Weeks    Status Partially Met    Target Date 09/21/21      PT LONG TERM GOAL #2   Title Patient to demonstrate 5/5 strength Lt UE to perform functional activities per protocol    Time 6    Period Weeks    Status New    Target Date 09/21/21      PT LONG TERM GOAL #3   Title Patient reports ability to return to work, leisure activities,and sleep without limitations and with minimal to  no pain.    Time 6    Period Weeks    Status New    Target Date 09/21/21      PT LONG TERM GOAL #4   Title Independent in  HEP    Time 6    Period Weeks    Status New    Target Date 09/21/21      PT LONG TERM GOAL #5   Title Improve functional score to 54    Time 6    Period Weeks    Status On-going    Target Date 09/21/21                   Plan - 08/24/21 0804     Clinical Impression Statement Continued pain which has increased in the past few days. The exercise to stretch with hands behind her head irritates the shoulder. Continued with ROM and strengthening. Palpable significant tightness in the Lt pecs/biceps/posterior shoulder girdle musculature. Good response to DN and manual work.    Rehab Potential Good    PT Frequency 2x / week    PT Duration 8 weeks    PT Treatment/Interventions ADLs/Self Care Home Management;Aquatic Therapy;Cryotherapy;Electrical Stimulation;Iontophoresis 62m/ml Dexamethasone;Moist Heat;Ultrasound;Functional mobility training;Therapeutic exercise;Neuromuscular re-education;Patient/family education;Manual techniques;Passive range of motion;Dry needling;Taping;Vasopneumatic Device    PT Next Visit Plan progress AROM per protocol, manual and modalities as indicated; continue DN and manual work - patient will try more frequent gentle stretches during the day    PT Home Exercise Plan [email or web address]    Consulted and Agree with Plan of Care Patient             Patient will benefit from skilled therapeutic intervention in order to improve the following deficits and impairments:     Visit Diagnosis: Left shoulder pain, unspecified chronicity  Other symptoms and signs involving the musculoskeletal system  Abnormal posture  Cervicalgia     Problem List Patient Active Problem List   Diagnosis Date Noted   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 07/30/2020   Arthritis of first metatarsophalangeal (MTP) joint of right  foot 07/02/2020   Occipital neuralgia 03/18/2020   Phlebitis 02/01/2020   Well adult exam 01/23/2020   Cervical radiculopathy 12/22/2019   Rectal bleeding 041/93/7902  Folliculitis 040/97/3532  Hydrocele 09/26/2017   Hyperglycemia 08/26/2014   Hyperkalemia 06/25/2014   Abnormal CT scan, small bowel 04/13/2014   Liver hemangioma 12/16/2012   Urinary frequency 09/02/2012   Bilateral inguinal hernia (BIH) 03/25/2012   Esophageal reflux disease 09/03/2011   Gastric ulcer due to Helicobacter pylori 199/24/2683  Chronic back pain greater than 3 months duration 09/03/2011   Neck pain, chronic 07/06/2011    Lyrick Worland PNilda Simmer PT, MPH  08/24/2021, 8:54 AM  [facility] [place] [address]Phone: [phone]  Fax:  [phone] Name: LNickolaus Bordelon LGaliMRN: [id number] of Birth: [birth year]

## 2021-08-24 NOTE — Therapy (Signed)
[place], [place], [address] Phone: [phone]   Fax:  [phone]  Patient Details  Name: Danny Cohen MRN: [id number] Date of Birth: 09-10-1966 Referring Provider:  Hiram Gash, MD  Encounter Date: 08/24/2021   Everardo All, PT 08/24/2021, 8:41 AM  [facility] [place], [place], [address] Phone: [phone]   Fax:  [phone]

## 2021-09-13 ENCOUNTER — Encounter: Payer: No Typology Code available for payment source | Admitting: Rehabilitative and Restorative Service Providers"

## 2021-10-19 ENCOUNTER — Ambulatory Visit: Payer: Self-pay | Admitting: Rehabilitative and Restorative Service Providers"

## 2021-10-21 ENCOUNTER — Other Ambulatory Visit: Payer: Self-pay

## 2021-10-21 ENCOUNTER — Ambulatory Visit
Payer: No Typology Code available for payment source | Attending: Orthopaedic Surgery | Admitting: Rehabilitative and Restorative Service Providers"

## 2021-10-21 ENCOUNTER — Encounter: Payer: Self-pay | Admitting: Rehabilitative and Restorative Service Providers"

## 2021-10-21 DIAGNOSIS — M25512 Pain in left shoulder: Secondary | ICD-10-CM | POA: Insufficient documentation

## 2021-10-21 DIAGNOSIS — M542 Cervicalgia: Secondary | ICD-10-CM | POA: Diagnosis not present

## 2021-10-21 DIAGNOSIS — R29898 Other symptoms and signs involving the musculoskeletal system: Secondary | ICD-10-CM | POA: Insufficient documentation

## 2021-10-21 DIAGNOSIS — R293 Abnormal posture: Secondary | ICD-10-CM | POA: Insufficient documentation

## 2021-10-21 NOTE — Therapy (Signed)
[facility] [place], [place], [address] Phone: [phone]   Fax:  [phone]  Physical Therapy Treatment  Patient Details  Name: Danny Cohen. [place] MRN: [id number] Date of Birth: 07/27/1966 Referring Provider (PT): Dr Ophelia Charter   Encounter Date: 10/21/2021   PT End of Session - 10/21/21 1316     Visit Number 10    Number of Visits 22    Date for PT Re-Evaluation 12/02/21    PT Start Time 1316    PT Stop Time 1400    PT Time Calculation (min) 44 min    Activity Tolerance Patient tolerated treatment well             Past Medical History:  Diagnosis Date   Arthritis    Chronic back pain greater than 3 months duration 09/03/2011   Esophageal reflux disease 09/03/2011   Gastric ulcer due to Helicobacter pylori 81/09/7508   Peptic ulcer due to Helicobacter pylori 25/85/2778    Past Surgical History:  Procedure Laterality Date   HERNIA REPAIR     LAPAROSCOPIC INGUINAL HERNIA REPAIR  04/23/2012   bilateral   SHOULDER ARTHROSCOPY WITH SUBACROMIAL DECOMPRESSION AND BICEP TENDON REPAIR Left 05/12/2021   Procedure: SHOULDER ARTHROSCOPY WITH SUBACROMIAL DECOMPRESSION AND BICEP TENDON REPAIR;  Surgeon: Hiram Gash, MD;  Location: [facility];  Service: Orthopedics;  Laterality: Left;    There were no vitals filed for this visit.   Subjective Assessment - 10/21/21 1348     Subjective Lt shoulder and shoulder blade have continued to hurt and are worse than they have been. He has tried to continue with his stretching and strengthening exercises but sometimes has missed exercises. Using heat on the Lt shoulder girdle. Pain limits functional and work activities.    Currently in Pain? Yes    Pain Score 5     Pain Location Shoulder    Pain Orientation Left    Pain Descriptors / Indicators Aching;Sore    Pain Type Chronic pain    Pain Onset More than a month ago    Pain Frequency Intermittent     Aggravating Factors  using arm; some of the exercises    Pain Relieving Factors heat                [facility] PT Assessment - 10/21/21 0001       Assessment   Medical Diagnosis Lt shoulder SAD; biceps tendon repair    Referring Provider (PT) Dr Ophelia Charter    Onset Date/Surgical Date 05/12/21    Hand Dominance Right    Next MD Visit PRN    Prior Therapy here for neck and shoulder      AROM   Right Shoulder Extension 40 Degrees    Right Shoulder Flexion 160 Degrees    Right Shoulder ABduction 155 Degrees    Right Shoulder Internal Rotation --   thumb to T5   Right Shoulder External Rotation 112 Degrees    Left Shoulder Extension 37 Degrees    Left Shoulder Flexion 136 Degrees    Left Shoulder ABduction 135 Degrees    Left Shoulder Internal Rotation --   thumb T11   Left Shoulder External Rotation 73 Degrees      PROM   Left Shoulder Extension 53 Degrees    Left Shoulder Flexion 156 Degrees   in scapular plane   Left Shoulder ABduction 162 Degrees   in scapular plane   Left Shoulder External Rotation  79 Degrees   in scapular plane     Strength   Left Shoulder Flexion 4+/5    Left Shoulder Extension 5/5    Left Shoulder ABduction 5/5    Left Shoulder Internal Rotation 5/5    Left Shoulder External Rotation 5/5      Palpation   Palpation comment muscular tightness Lt shoulder pecs; biceps; anterior deltoid; upper trap; leveator                           [facility] Adult PT Treatment/Exercise - 10/21/21 0001       Shoulder Exercises: Supine   Other Supine Exercises chest lift 10 sec x 10 reps      Shoulder Exercises: Standing   External Rotation Strengthening;Left;10 reps;Theraband    Theraband Level (Shoulder External Rotation) Level 4 (Blue)    External Rotation Limitations reactive isometric step out x 10    Internal Rotation Strengthening;Left;10 reps;Theraband    Theraband Level (Shoulder Internal Rotation) Level 4 (Blue)    Internal Rotation  Limitations reactive isometric step out x 10    Flexion AAROM;Left;10 reps    Flexion Limitations shoulder flexion at wall bilat 10 sec x 10 reps    Extension Strengthening;Both;20 reps;Theraband    Theraband Level (Shoulder Extension) Level 4 (Blue)    Row Strengthening;Both;10 reps;Theraband    Theraband Level (Shoulder Row) Level 4 (Blue)    Row Limitations step back reactive x 10; bow and arrow x 10 each side blue TB      Shoulder Exercises: ROM/Strengthening   UBE (Upper Arm Bike) L4 x 4 min 2 min fwd/back      Shoulder Exercises: Stretch   Other Shoulder Stretches biceps stretch 20 sec x 3 reps    Other Shoulder Stretches doorway stretch to patient tolerance 3 positions 15-20 sec hold x 2 reps each position      Electrical Stimulation   Electrical Stimulation Location Lt shoulder    Electrical Stimulation Action mAmp x 5 min    Electrical Stimulation Parameters to tolerance    Electrical Stimulation Goals Pain;Tone      Manual Therapy   Manual therapy comments skilled palpation and monitoring of soft tissue during DN    Joint Mobilization GH joint mobs AP and inferior    Soft tissue mobilization Lt shoulder girdle pecs; upper trap; leveator; biceps; teres/lats    Scapular Mobilization Lt    Passive ROM Lt shoulder flexion; abduction in scapular plane; ER in scapular plane; shoulder extension; horizontal abduction in supine              Trigger Point Dry Needling - 10/21/21 0001     Consent Given? Yes    Education Handout Provided Previously provided    Printmaker Performed with Dry Needling Yes    Other Dry Needling Lt    Upper Trapezius Response Palpable increased muscle length    Pectoralis Major Response Palpable increased muscle length;Twitch response elicited    Pectoralis Minor Response Palpable increased muscle length;Twitch response elicited    Deltoid Response Palpable increased muscle length;Twitch response elicited    Biceps Response Palpable  increased muscle length;Twitch response elicited                        PT Long Term Goals - 10/21/21 1347       PT LONG TERM GOAL #1   Title Increase PROM/AROM Lt shoulder to >/= AROM Rt  shoulder    Time 6    Period Weeks    Status On-going    Target Date 12/02/21      PT LONG TERM GOAL #2   Title Patient to demonstrate 5/5 strength Lt UE to perform functional activities    Time 6    Period Weeks    Status On-going    Target Date 12/02/21      PT LONG TERM GOAL #3   Title Patient reports ability to return to work, leisure activities,and sleep without limitations and with minimal to no pain.    Time 6    Period Weeks    Status On-going    Target Date 12/02/21      PT LONG TERM GOAL #4   Title Independent in HEP    Time 6    Period Weeks    Status On-going    Target Date 12/02/21      PT LONG TERM GOAL #5   Title Improve functional score to 54    Time 6    Period Weeks    Status On-going    Target Date 12/02/21                    Patient will benefit from skilled therapeutic intervention in order to improve the following deficits and impairments:     Visit Diagnosis: Left shoulder pain, unspecified chronicity  Other symptoms and signs involving the musculoskeletal system  Abnormal posture  Cervicalgia  Weakness of left upper extremity     Problem List Patient Active Problem List   Diagnosis Date Noted   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 07/30/2020   Arthritis of first metatarsophalangeal (MTP) joint of right foot 07/02/2020   Occipital neuralgia 03/18/2020   Phlebitis 02/01/2020   Well adult exam 01/23/2020   Cervical radiculopathy 12/22/2019   Rectal bleeding 34/28/7681   Folliculitis 15/72/6203   Hydrocele 09/26/2017   Hyperglycemia 08/26/2014   Hyperkalemia 06/25/2014   Abnormal CT scan, small bowel 04/13/2014   Liver hemangioma 12/16/2012   Urinary frequency 09/02/2012   Bilateral  inguinal hernia (BIH) 03/25/2012   Esophageal reflux disease 09/03/2011   Gastric ulcer due to Helicobacter pylori 55/97/4163   Chronic back pain greater than 3 months duration 09/03/2011   Neck pain, chronic 07/06/2011    Almetta Liddicoat Nilda Simmer, PT, MPH  10/21/2021, 1:54 PM  [facility] [place], [place], [address] Phone: [phone]   Fax:  [phone]  Name: Curren Mohrmann. [place] MRN: [id number] Date of Birth: April 15, 1966

## 2021-11-02 ENCOUNTER — Ambulatory Visit: Payer: No Typology Code available for payment source | Admitting: Rehabilitative and Restorative Service Providers"

## 2021-11-02 ENCOUNTER — Encounter: Payer: Self-pay | Admitting: Rehabilitative and Restorative Service Providers"

## 2021-11-02 ENCOUNTER — Other Ambulatory Visit: Payer: Self-pay

## 2021-11-02 DIAGNOSIS — R293 Abnormal posture: Secondary | ICD-10-CM

## 2021-11-02 DIAGNOSIS — R29898 Other symptoms and signs involving the musculoskeletal system: Secondary | ICD-10-CM

## 2021-11-02 DIAGNOSIS — M25512 Pain in left shoulder: Secondary | ICD-10-CM

## 2021-11-02 NOTE — Patient Instructions (Signed)
Access Code: [email or web address] URL: [email or web address] Date: 11/02/2021 Prepared by: Gillermo Murdoch  Exercises Circular Shoulder Pendulum with Table Support - 3-4 x daily - 7 x weekly - 1 sets - 20-30 reps Seated Scapular Retraction - 2 x daily - 7 x weekly - 1-2 sets - 10 reps - 10 sec hold Seated Shoulder External Rotation AAROM with Cane and Hand in Neutral - 1 x daily - 7 x weekly - 1 sets - 5-10 reps - 5-10 sec hold Shoulder Flexion Wall Slide with Towel - 1 x daily - 7 x weekly - 1 sets - 5-10 reps - 10 sec hold Supine Scapular Retraction - 2 x daily - 7 x weekly - 1 sets - 10 reps - 5-10 sec hold Shoulder External Rotation and Scapular Retraction with Resistance - 1 x daily - 7 x weekly - 3 sets - 10 reps Scapular Retraction with Resistance - 1 x daily - 7 x weekly - 3 sets - 10 reps Scapular Retraction with Resistance Advanced - 1 x daily - 7 x weekly - 3 sets - 10 reps Drawing Bow - 1 x daily - 7 x weekly - 1 sets - 10 reps - 3 sec hold Shoulder External Rotation Reactive Isometrics - 1 x daily - 7 x weekly - 1 sets - 10 reps - 3 sec hold Shoulder Internal Rotation Reactive Isometrics - 1 x daily - 7 x weekly - 1 sets - 10 reps - 3-5 sec hold Standing Bicep Curl with Dumbbells - 1 x daily - 7 x weekly - 1 sets - 10 reps - 3 sec hold Bicep Stretch at Table - 2 x daily - 7 x weekly - 1 sets - 3 reps - 30 sec hold Standing Shoulder Flexion Stretch on Wall - 2 x daily - 7 x weekly - 2 sets - 10 reps - 3 sec hold Doorway Pec Stretch at 60 Degrees Abduction - 3 x daily - 7 x weekly - 1 sets - 3 reps Doorway Pec Stretch at 90 Degrees Abduction - 3 x daily - 7 x weekly - 1 sets - 3 reps - 30 seconds hold Doorway Pec Stretch at 120 Degrees Abduction - 3 x daily - 7 x weekly - 1 sets - 3 reps - 30 second hold hold Drawing Bow - 1 x daily - 7 x weekly - 1 sets - 10 reps - 3 sec hold Seated Shoulder External Rotation with Resistance - 1 x daily - 7 x weekly - 1 sets - 10 reps - 3 sec  hold Standing Single Arm Shoulder External Rotation in Abduction with Anchored Resistance - 1 x daily - 7 x weekly - 1 sets - 10 reps - 3 sec hold Seated Shoulder Internal Rotation with Anchored Resistance - 1 x daily - 7 x weekly - 1 sets - 10 reps - 3 sec hold Supine Chest Stretch with Elbows Bent - 1 x daily - 7 x weekly - 1 sets - 3 reps - 15-30 sec hold Supine Chest Stretch on Foam Roll - 1 x daily - 7 x weekly - 1 sets - 1 reps - 2-5 min sec hold Shoulder ER Stretch in Abduction - 1 x daily - 7 x weekly - 1 sets - 3 reps - 30 sec hold

## 2021-11-02 NOTE — Therapy (Signed)
[place], [place], [address] Phone: [phone]   Fax:  [phone]  Physical Therapy Treatment  Patient Details  Name: Danny Cohen. Danny Cohen MRN: [id number] Date of Birth: [birth year] Referring Provider (PT): Dr Ophelia Charter   Encounter Date: 11/02/2021   PT End of Session - 11/02/21 0800     Visit Number 11    Number of Visits 22    Date for PT Re-Evaluation 12/02/21    PT Start Time 0759    PT Stop Time 0847    PT Time Calculation (min) 48 min    Activity Tolerance Patient tolerated treatment well             Past Medical History:  Diagnosis Date   Arthritis    Chronic back pain greater than 3 months duration 09/03/2011   Esophageal reflux disease 09/03/2011   Gastric ulcer due to Helicobacter pylori 43/32/9518   Peptic ulcer due to Helicobacter pylori 84/16/6063    Past Surgical History:  Procedure Laterality Date   HERNIA REPAIR     LAPAROSCOPIC INGUINAL HERNIA REPAIR  04/23/2012   bilateral   SHOULDER ARTHROSCOPY WITH SUBACROMIAL DECOMPRESSION AND BICEP TENDON REPAIR Left 05/12/2021   Procedure: SHOULDER ARTHROSCOPY WITH SUBACROMIAL DECOMPRESSION AND BICEP TENDON REPAIR;  Surgeon: Hiram Gash, MD;  Location: [facility];  Service: Orthopedics;  Laterality: Left;    There were no vitals filed for this visit.   Subjective Assessment - 11/02/21 0800     Subjective Patient reports that his shoulder is still sore. He has been doing some exercises at home. Focus on doorway and band exercises and they sometimes shoudler feels a little looser.    Currently in Pain? Yes    Pain Score 5     Pain Location Shoulder    Pain Orientation Left    Pain Descriptors / Indicators Aching;Sore    Pain Type Chronic pain                               [facility] Adult PT Treatment/Exercise - 11/02/21 0001       Shoulder Exercises: Supine   Other Supine Exercises chest  lift 10 sec x 10 reps      Shoulder Exercises: Standing   External Rotation Strengthening;Left;10 reps;Theraband    Theraband Level (Shoulder External Rotation) Level 4 (Blue)    External Rotation Limitations reactive isometric step out x 10    Internal Rotation Strengthening;Left;10 reps;Theraband    Theraband Level (Shoulder Internal Rotation) Level 4 (Blue)    Internal Rotation Limitations reactive isometric step out x 10    Flexion AAROM;Left;10 reps    Flexion Limitations shoulder flexion at wall bilat 10 sec x 10 reps    Extension Strengthening;Both;20 reps;Theraband    Theraband Level (Shoulder Extension) Level 4 (Blue)    Row Strengthening;Both;10 reps;Theraband    Theraband Level (Shoulder Row) Level 4 (Blue)    Row Limitations step back reactive x 10; bow and arrow x 10 each side blue TB      Shoulder Exercises: ROM/Strengthening   UBE (Upper Arm Bike) L4 x 4 min 2 min fwd/back    Other ROM/Strengthening Exercises flexion on wall sliding arms up the wall and then lifting off while facing the wall x 10 reps 3 sec hold      Shoulder Exercises: Stretch   Wall Stretch - ABduction Limitations arm at 90 deg  elbow straight 30 sec x 3 repeated with elbow flexed at 90 deg 30 sec x 3 reps    Other Shoulder Stretches biceps stretch 20 sec x 3 reps    Other Shoulder Stretches doorway stretch to patient tolerance 3 positions 15-20 sec hold x 2 reps each position      Electrical Stimulation   Electrical Stimulation Location Lt shoulder    Electrical Stimulation Action mAmp x 5 min    Electrical Stimulation Parameters to tolerance    Electrical Stimulation Goals Pain;Tone      Manual Therapy   Manual therapy comments skilled palpation and monitoring of soft tissue during DN    Joint Mobilization GH joint mobs AP and inferior    Soft tissue mobilization Lt shoulder girdle pecs; upper trap; leveator; biceps; teres/lats    Scapular Mobilization Lt    Passive ROM Lt shoulder flexion;  abduction in scapular plane; ER in scapular plane; shoulder extension; horizontal abduction in supine              Trigger Point Dry Needling - 11/02/21 0001     Consent Given? Yes    Education Handout Provided Previously provided    Printmaker Performed with Dry Needling Yes    Other Dry Needling Lt    Upper Trapezius Response Palpable increased muscle length    Pectoralis Major Response Palpable increased muscle length;Twitch response elicited    Pectoralis Minor Response Palpable increased muscle length;Twitch response elicited    Supraspinatus Response Palpable increased muscle length;Twitch response elicited    Infraspinatus Response Palpable increased muscle length;Twitch response elicited    Subscapularis Response Palpable increased muscle length;Twitch response elicited    Deltoid Response Palpable increased muscle length;Twitch response elicited    Teres major Response Palpable increased muscle length;Twitch response elicited    Teres minor Response Palpable increased muscle length;Twitch response elicited    Biceps Response Palpable increased muscle length;Twitch response elicited                   PT Education - 11/02/21 0810     Education Details HEP    Person(s) Educated Patient    Methods Explanation;Demonstration;Tactile cues;Verbal cues;Handout    Comprehension Verbalized understanding;Returned demonstration;Verbal cues required;Tactile cues required                 PT Long Term Goals - 10/21/21 1347       PT LONG TERM GOAL #1   Title Increase PROM/AROM Lt shoulder to >/= AROM Rt shoulder    Time 6    Period Weeks    Status On-going    Target Date 12/02/21      PT LONG TERM GOAL #2   Title Patient to demonstrate 5/5 strength Lt UE to perform functional activities    Time 6    Period Weeks    Status On-going    Target Date 12/02/21      PT LONG TERM GOAL #3   Title Patient reports ability to return to work, leisure  activities,and sleep without limitations and with minimal to no pain.    Time 6    Period Weeks    Status On-going    Target Date 12/02/21      PT LONG TERM GOAL #4   Title Independent in HEP    Time 6    Period Weeks    Status On-going    Target Date 12/02/21      PT LONG TERM GOAL #5  Title Improve functional score to 54    Time 6    Period Weeks    Status On-going    Target Date 12/02/21                   Plan - 11/02/21 0802     Clinical Impression Statement Shouder pain and soreness persist with patient c/o soreness in the Lt shoulder. He has tried to be more consistent with the exercises. Note continued rounded shoulders with scapulae abducted and rotated along the thoracic wall. Muscular tightness to palpation through the pecs and anterior deltoid/biceps. Good response to DN and manual work as well as exercise.    Rehab Potential Good    PT Frequency 2x / week    PT Duration 8 weeks    PT Treatment/Interventions ADLs/Self Care Home Management;Aquatic Therapy;Cryotherapy;Electrical Stimulation;Iontophoresis 4mg /ml Dexamethasone;Moist Heat;Ultrasound;Functional mobility training;Therapeutic exercise;Neuromuscular re-education;Patient/family education;Manual techniques;Passive range of motion;Dry needling;Taping;Vasopneumatic Device    PT Next Visit Plan progress AROM per protocol, manual and modalities as indicated; continue DN and manual work - patient will try more frequent gentle stretches during the day    PT Home Exercise Plan [email or web address]    Consulted and Agree with Plan of Care Patient             Patient will benefit from skilled therapeutic intervention in order to improve the following deficits and impairments:     Visit Diagnosis: Left shoulder pain, unspecified chronicity  Other symptoms and signs involving the musculoskeletal system  Abnormal posture     Problem List Patient Active Problem List   Diagnosis Date Noted   Left shoulder  acromioclavicular osteoarthritis and labral tear 07/30/2020   Arthritis of first metatarsophalangeal (MTP) joint of right foot 07/02/2020   Occipital neuralgia 03/18/2020   Phlebitis 02/01/2020   Well adult exam 01/23/2020   Cervical radiculopathy 12/22/2019   Rectal bleeding 00/71/2197   Folliculitis 58/83/2549   Hydrocele 09/26/2017   Hyperglycemia 08/26/2014   Hyperkalemia 06/25/2014   Abnormal CT scan, small bowel 04/13/2014   Liver hemangioma 12/16/2012   Urinary frequency 09/02/2012   Bilateral inguinal hernia (BIH) 03/25/2012   Esophageal reflux disease 09/03/2011   Gastric ulcer due to Helicobacter pylori 82/64/1583   Chronic back pain greater than 3 months duration 09/03/2011   Neck pain, chronic 07/06/2011    Hailyn Zarr Nilda Simmer, PT, MPH  11/02/2021, 8:42 AM  [facility] [place], [place], [address] Phone: [phone]   Fax:  [phone]  Name: Danny Cohen. Danny Cohen MRN: [id number] Date of Birth: Oct 25, 1965

## 2021-11-09 ENCOUNTER — Ambulatory Visit: Payer: No Typology Code available for payment source | Admitting: Rehabilitative and Restorative Service Providers"

## 2021-11-11 ENCOUNTER — Encounter: Payer: Self-pay | Admitting: Rehabilitative and Restorative Service Providers"

## 2021-11-11 ENCOUNTER — Ambulatory Visit
Payer: No Typology Code available for payment source | Attending: Orthopaedic Surgery | Admitting: Rehabilitative and Restorative Service Providers"

## 2021-11-11 ENCOUNTER — Other Ambulatory Visit: Payer: Self-pay

## 2021-11-11 DIAGNOSIS — M25512 Pain in left shoulder: Secondary | ICD-10-CM | POA: Insufficient documentation

## 2021-11-11 DIAGNOSIS — R293 Abnormal posture: Secondary | ICD-10-CM | POA: Diagnosis present

## 2021-11-11 DIAGNOSIS — R29898 Other symptoms and signs involving the musculoskeletal system: Secondary | ICD-10-CM | POA: Diagnosis present

## 2021-11-11 DIAGNOSIS — M542 Cervicalgia: Secondary | ICD-10-CM | POA: Diagnosis present

## 2021-11-11 NOTE — Patient Instructions (Addendum)
IONTOPHORESIS PATIENT PRECAUTIONS & CONTRAINDICATIONS: ? ?Redness under one or both electrodes can occur.  This characterized by a uniform redness that usually disappears within 12 hours of treatment. ?Small pinhead size blisters may result in response to the drug.  Contact your physician if the problem persists more than 24 hours. ?On rare occasions, iontophoresis therapy can result in temporary skin reactions such as rash, inflammation, irritation or burns.  The skin reactions may be the result of individual sensitivity to the ionic solution used, the condition of the skin at the start of treatment, reaction to the materials in the electrodes, allergies or sensitivity to dexamethasone, or a poor connection between the patch and your skin.  Discontinue using iontophoresis if you have any of these reactions and report to your therapist. ?Remove the Patch? or electrodes if you have any undue sensation of pain or burning during the treatment and report discomfort to your therapist. ?Tell your Therapist if you have had known adverse reactions to the application of electrical current. ?If using the Patch?, the LED light will turn off when treatment is complete and the patch can be removed.  Approximate treatment time is 1-3 hours.  Remove the patch when light goes off or after 6 hours. ?The Patch? can be worn during normal activity, however excessive motion where the electrodes have been placed can cause poor contact between the skin and the electrode or uneven electrical current resulting in greater risk of skin irritation. ?Keep out of the reach of children. ? ? ?DO NOT use if you have a cardiac pacemaker or any other electrically sensitive implanted device. ?DO NOT use if you have a known sensitivity to dexamethasone. ?DO NOT use during Magnetic Resonance Imaging (MRI). ?DO NOT use over broken or compromised skin (e.g. sunburn, cuts, or acne) due to the increased risk of skin reaction. ?DO NOT SHAVE over the area to  be treated:  To establish good contact between the Patch? and the skin, excessive hair may be clipped. ?DO NOT place the Patch? or electrodes on or over your eyes, directly over your heart, or brain. ?DO NOT reuse the Patch? or electrodes as this may cause burns to occur. ? ?Access Code: [email or web address] ?URL: [email or web address] ?Date: 11/11/2021 ?Prepared by: Gillermo Murdoch ? ?Exercises ?Seated Scapular Retraction - 2 x daily - 7 x weekly - 1-2 sets - 10 reps - 10 sec hold ?Seated Shoulder External Rotation AAROM with Cane and Hand in Neutral - 1 x daily - 7 x weekly - 1 sets - 5-10 reps - 5-10 sec hold ?Shoulder Flexion Wall Slide with Towel - 1 x daily - 7 x weekly - 1 sets - 5-10 reps - 10 sec hold ?Supine Scapular Retraction - 2 x daily - 7 x weekly - 1 sets - 10 reps - 5-10 sec hold ?Shoulder External Rotation and Scapular Retraction with Resistance - 1 x daily - 7 x weekly - 3 sets - 10 reps ?Scapular Retraction with Resistance - 1 x daily - 7 x weekly - 3 sets - 10 reps ?Scapular Retraction with Resistance Advanced - 1 x daily - 7 x weekly - 3 sets - 10 reps ?Drawing Bow - 1 x daily - 7 x weekly - 1 sets - 10 reps - 3 sec hold ?Shoulder External Rotation Reactive Isometrics - 1 x daily - 7 x weekly - 1 sets - 10 reps - 3 sec hold ?Shoulder Internal Rotation Reactive Isometrics - 1 x daily - 7 x weekly - 1  sets - 10 reps - 3-5 sec hold ?Standing Bicep Curl with Dumbbells - 1 x daily - 7 x weekly - 1 sets - 10 reps - 3 sec hold ?Bicep Stretch at Table - 2 x daily - 7 x weekly - 1 sets - 3 reps - 30 sec hold ?Standing Shoulder Flexion Stretch on Wall - 2 x daily - 7 x weekly - 2 sets - 10 reps - 3 sec hold ?Doorway Pec Stretch at 60 Degrees Abduction - 3 x daily - 7 x weekly - 1 sets - 3 reps ?Doorway Pec Stretch at 90 Degrees Abduction - 3 x daily - 7 x weekly - 1 sets - 3 reps - 30 seconds hold ?Doorway Pec Stretch at 120 Degrees Abduction - 3 x daily - 7 x weekly - 1 sets - 3 reps - 30 second hold  hold ?Standing Single Arm Shoulder External Rotation in Abduction with Anchored Resistance - 1 x daily - 7 x weekly - 1 sets - 10 reps - 3 sec hold ?Supine Chest Stretch with Elbows Bent - 1 x daily - 7 x weekly - 1 sets - 3 reps - 15-30 sec hold ?Supine Chest Stretch on Foam Roll - 1 x daily - 7 x weekly - 1 sets - 1 reps - 2-5 min sec hold ?Shoulder ER Stretch in Abduction - 1 x daily - 7 x weekly - 1 sets - 3 reps - 30 sec hold ?Shoulder Flexion Serratus Activation with Resistance - 1 x daily - 7 x weekly - 1 sets - 10 reps - 3-5 sec hold ?Wall Clock with Theraband - 1 x daily - 7 x weekly - 1 sets - 10 reps - 2-3 sec hold ?Anterior Shoulder and Biceps Stretch - 2 x daily - 7 x weekly - 1 sets - 3 reps - 15-20 sec hold ? ? ?

## 2021-11-11 NOTE — Therapy (Signed)
[facility] ?[facility] ?[place] ?[place], [place], [address] ?Phone: [phone]   Fax:  [phone] ? ?Physical Therapy Treatment ? ?Patient Details  ?Name: Danny Cohen ?MRN: [id number] ?Date of Birth: 21-May-1966 ?Referring Provider (PT): Dr Ophelia Charter ? ? ?Encounter Date: 11/11/2021 ? ? PT End of Session - 11/11/21 0809   ? ? Visit Number 12   ? Number of Visits 22   ? Date for PT Re-Evaluation 12/02/21   ? PT Start Time [phone]   ? PT Stop Time 0848   ? PT Time Calculation (min) 40 min   ? Activity Tolerance Patient tolerated treatment well   ? ?  ?  ? ?  ? ? ?Past Medical History:  ?Diagnosis Date  ? Arthritis   ? Chronic back pain greater than 3 months duration 09/03/2011  ? Esophageal reflux disease 09/03/2011  ? Gastric ulcer due to Helicobacter pylori 93/71/6967  ? Peptic ulcer due to Helicobacter pylori 89/38/1017  ? ? ?Past Surgical History:  ?Procedure Laterality Date  ? HERNIA REPAIR    ? LAPAROSCOPIC INGUINAL HERNIA REPAIR  04/23/2012  ? bilateral  ? SHOULDER ARTHROSCOPY WITH SUBACROMIAL DECOMPRESSION AND BICEP TENDON REPAIR Left 05/12/2021  ? Procedure: SHOULDER ARTHROSCOPY WITH SUBACROMIAL DECOMPRESSION AND BICEP TENDON REPAIR;  Surgeon: Hiram Gash, MD;  Location: [place];  Service: Orthopedics;  Laterality: Left;  ? ? ?There were no vitals filed for this visit. ? ? Subjective Assessment - 11/11/21 0810   ? ? Subjective Patient reports that his shoulder is about the same. he is having some pain in the Lt medial elbow that keeps him from sleeping. Neck has been bothering him the past few days from leaf blowing.   ? Currently in Pain? Yes   ? Pain Score 4    ? Pain Location Shoulder   ? Pain Orientation Left   ? Pain Descriptors / Indicators Aching;Sore   ? Pain Type Chronic pain   ? Pain Onset More than a month ago   ? Pain Frequency Intermittent   ? ?  ?  ? ?  ? ? ? ? ? [facility] PT Assessment - 11/11/21 0001   ? ?  ? Assessment  ? Medical  Diagnosis Lt shoulder SAD; biceps tendon repair   ? Referring Provider (PT) Dr Ophelia Charter   ? Onset Date/Surgical Date 05/12/21   ? Hand Dominance Right   ? Next MD Visit PRN   ? Prior Therapy here for neck and shoulder   ?  ? Palpation  ? Palpation comment muscular tightness Lt medial epicondyle and forearm flexors; shoulder pecs; biceps; anterior deltoid; upper trap; leveator   ?  ? Special Tests  ? Other special tests tightness to palpation medial epicondyle Lt elbow   ? ?  ?  ? ?  ? ? ? ? ? ? ? ? ? ? ? ? ? ? ? ? [facility] Adult PT Treatment/Exercise - 11/11/21 0001   ? ?  ? Shoulder Exercises: Supine  ? Other Supine Exercises chest lift 10 sec x 10 reps   ?  ? Shoulder Exercises: Standing  ? Other Standing Exercises serratusactivation 5 sec x 10; shoulder clock x 5 reps each side   ?  ? Shoulder Exercises: ROM/Strengthening  ? UBE (Upper Arm Bike) L5 x 4 min 2 min fwd/back   ? Other ROM/Strengthening Exercises flexion on wall sliding arms up the wall and then lifting off while facing the  wall x 10 reps 3 sec hold   ? Other ROM/Strengthening Exercises flexor forearm stretch sitting 15 sec hold arm forward and behind back x 3 reps each postion   ?  ? Shoulder Exercises: Stretch  ? Wall Stretch - ABduction Limitations arm at 90 deg elbow straight 30 sec x 3 repeated with elbow flexed at 90 deg 30 sec x 3 reps   ? Other Shoulder Stretches biceps stretch 20 sec x 3 reps   ? Other Shoulder Stretches doorway stretch to patient tolerance 3 positions 15-20 sec hold x 2 reps each position   ?  ? Iontophoresis  ? Type of Iontophoresis Dexamethasone   ? Location medial Lt elbow   ? Dose 80 mAmp/1 cc   ? Time 8 hours   ?  ? Manual Therapy  ? Soft tissue mobilization Lt shoulder girdle pecs; upper trap; leveator; biceps; teres/lats   ? Scapular Mobilization Lt   ? ?  ?  ? ?  ? ? ? ? ? ? ? ? ? ? PT Education - 11/11/21 5397   ? ? Education Details HEP; ionto   ? Person(s) Educated Patient   ? Methods  Explanation;Demonstration;Tactile cues;Verbal cues   ? Comprehension Verbalized understanding;Returned demonstration;Verbal cues required;Tactile cues required   ? ?  ?  ? ?  ? ? ? ? ? ? PT Long Term Goals - 10/21/21 1347   ? ?  ? PT LONG TERM GOAL #1  ? Title Increase PROM/AROM Lt shoulder to >/= AROM Rt shoulder   ? Time 6   ? Period Weeks   ? Status On-going   ? Target Date 12/02/21   ?  ? PT LONG TERM GOAL #2  ? Title Patient to demonstrate 5/5 strength Lt UE to perform functional activities   ? Time 6   ? Period Weeks   ? Status On-going   ? Target Date 12/02/21   ?  ? PT LONG TERM GOAL #3  ? Title Patient reports ability to return to work, leisure activities,and sleep without limitations and with minimal to no pain.   ? Time 6   ? Period Weeks   ? Status On-going   ? Target Date 12/02/21   ?  ? PT LONG TERM GOAL #4  ? Title Independent in [place]   ? Time 6   ? Period Weeks   ? Status On-going   ? Target Date 12/02/21   ?  ? PT LONG TERM GOAL #5  ? Title Improve functional score to 54   ? Time 6   ? Period Weeks   ? Status On-going   ? Target Date 12/02/21   ? ?  ?  ? ?  ? ? ? ? ? ? ? ? Plan - 11/11/21 6734   ? ? Clinical Impression Statement Shoulder pain and soreness continue as well as some pain and discomfort in the Lt elbow. Persistent tightness noted through the Lt shoulder girdle. Tender to palpation through the Lt medial epicondyle. Added stretch for flexor forearm and trial of ionto for medial epicondyle area   ? Rehab Potential Good   ? PT Frequency 2x / week   ? PT Duration 8 weeks   ? PT Treatment/Interventions ADLs/Self Care Home Management;Aquatic Therapy;Cryotherapy;Electrical Stimulation;Iontophoresis 4mg /ml Dexamethasone;Moist Heat;Ultrasound;Functional mobility training;Therapeutic exercise;Neuromuscular re-education;Patient/family education;Manual techniques;Passive range of motion;Dry needling;Taping;Vasopneumatic Device   ? PT Next Visit Plan progress AROM per protocol, manual and modalities  as indicated; continue DN and manual work -  patient will try more frequent gentle stretches during the day - assess response to ionto at Lt elbow   ? PT Home Exercise Plan [email or web address]   ? Consulted and Agree with Plan of Care Patient   ? ?  ?  ? ?  ? ? ?Patient will benefit from skilled therapeutic intervention in order to improve the following deficits and impairments:    ? ?Visit Diagnosis: ?Left shoulder pain, unspecified chronicity ? ?Other symptoms and signs involving the musculoskeletal system ? ?Abnormal posture ? ?Cervicalgia ? ? ? ? ?Problem List ?Patient Active Problem List  ? Diagnosis Date Noted  ? 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 Arthritis of first metatarsophalangeal (MTP) joint of right foot 07/02/2020  ? Occipital neuralgia 03/18/2020  ? Phlebitis 02/01/2020  ? Well adult exam 01/23/2020  ? Cervical radiculopathy 12/22/2019  ? Rectal bleeding 10/27/2019  ? Folliculitis 59/93/5701  ? Hydrocele 09/26/2017  ? Hyperglycemia 08/26/2014  ? Hyperkalemia 06/25/2014  ? Abnormal CT scan, small bowel 04/13/2014  ? Liver hemangioma 12/16/2012  ? Urinary frequency 09/02/2012  ? Bilateral inguinal hernia (BIH) 03/25/2012  ? Esophageal reflux disease 09/03/2011  ? Gastric ulcer due to Helicobacter pylori 77/93/9030  ? Chronic back pain greater than 3 months duration 09/03/2011  ? Neck pain, chronic 07/06/2011  ? ? ?Everardo All, PT, MPH ?11/11/2021, 8:48 AM ? ?[place] ?[facility] ?[place] ?[place], [place], [address] ?Phone: [phone]   Fax:  [phone] ? ?Name: Danny Cohen ?MRN: [id number] ?Date of Birth: [birth year] ? ? ? ?

## 2021-11-16 ENCOUNTER — Ambulatory Visit: Payer: No Typology Code available for payment source | Admitting: Rehabilitative and Restorative Service Providers"

## 2021-11-16 ENCOUNTER — Other Ambulatory Visit: Payer: Self-pay

## 2021-11-16 ENCOUNTER — Encounter: Payer: Self-pay | Admitting: Rehabilitative and Restorative Service Providers"

## 2021-11-16 DIAGNOSIS — R293 Abnormal posture: Secondary | ICD-10-CM

## 2021-11-16 DIAGNOSIS — M25512 Pain in left shoulder: Secondary | ICD-10-CM | POA: Diagnosis not present

## 2021-11-16 DIAGNOSIS — R29898 Other symptoms and signs involving the musculoskeletal system: Secondary | ICD-10-CM

## 2021-11-16 NOTE — Therapy (Signed)
[facility] [place], [place], [address] Phone: [phone]   Fax:  [phone]  Physical Therapy Treatment  Patient Details  Name: Danny Cohen MRN: [id number] Date of Birth: 23-Jun-1966 Referring Provider (PT): Dr Ophelia Charter   Encounter Date: 11/16/2021   PT End of Session - 11/16/21 0847     Visit Number 13    Date for PT Re-Evaluation 12/02/21    PT Start Time 0845    PT Stop Time 0933    PT Time Calculation (min) 48 min    Activity Tolerance Patient tolerated treatment well             Past Medical History:  Diagnosis Date   Arthritis    Chronic back pain greater than 3 months duration 09/03/2011   Esophageal reflux disease 09/03/2011   Gastric ulcer due to Helicobacter pylori 03/70/4888   Peptic ulcer due to Helicobacter pylori 91/69/4503    Past Surgical History:  Procedure Laterality Date   HERNIA REPAIR     LAPAROSCOPIC INGUINAL HERNIA REPAIR  04/23/2012   bilateral   SHOULDER ARTHROSCOPY WITH SUBACROMIAL DECOMPRESSION AND BICEP TENDON REPAIR Left 05/12/2021   Procedure: SHOULDER ARTHROSCOPY WITH SUBACROMIAL DECOMPRESSION AND BICEP TENDON REPAIR;  Surgeon: Hiram Gash, MD;  Location: [facility];  Service: Orthopedics;  Laterality: Left;    There were no vitals filed for this visit.   Subjective Assessment - 11/16/21 0847     Subjective Patient reports that he could not tell the ionto helped the elbow. He has continued pain and discomfort in the Lt shoulder and medical Lt elbow. Working on HEP    Currently in Pain? Yes    Pain Score 5     Pain Location Shoulder    Pain Orientation Left    Pain Descriptors / Indicators Sore;Aching    Pain Type Chronic pain    Pain Radiating Towards pain in the medial Lt elbow    Pain Onset More than a month ago    Pain Frequency Intermittent                [facility] PT Assessment - 11/16/21 0001       Assessment   Medical  Diagnosis Lt shoulder SAD; biceps tendon repair    Referring Provider (PT) Dr Ophelia Charter    Onset Date/Surgical Date 05/12/21    Hand Dominance Right    Next MD Visit PRN    Prior Therapy here for neck and shoulder      PROM   Overall PROM Comments continued tightness with PROM ER in scapular plane; shoudler extension; horizontal abduction      Palpation   Palpation comment muscular tightness Lt medial epicondyle and forearm flexors; shoulder pecs; biceps; anterior deltoid; upper trap; leveator      Special Tests   Other special tests tightness to palpation medial epicondyle Lt elbow                           [facility] Adult PT Treatment/Exercise - 11/16/21 0001       Shoulder Exercises: Supine   Other Supine Exercises chest lift 10 sec x 10 reps      Shoulder Exercises: Standing   Other Standing Exercises serratus activation 5 sec x 10; shoulder clock x 5 reps each side      Shoulder Exercises: ROM/Strengthening   UBE (Upper Arm Bike) L5 x 4.5 min 2.5 min  fwd/2 back    Other ROM/Strengthening Exercises flexion on wall sliding arms up the wall and then lifting off while facing the wall x 10 reps 3 sec hold    Other ROM/Strengthening Exercises flexor forearm stretch sitting 15 sec hold arm forward and behind back x 3 reps each postion      Shoulder Exercises: Stretch   Wall Stretch - ABduction Limitations arm at 90 deg elbow straight 30 sec x 3 repeated with elbow flexed at 90 deg 30 sec x 3 reps    Other Shoulder Stretches biceps stretch 20 sec x 3 reps    Other Shoulder Stretches doorway stretch to patient tolerance 3 positions 15-20 sec hold x 2 reps each position      Moist Heat Therapy   Number Minutes Moist Heat 10 Minutes    Moist Heat Location Shoulder;Elbow      Electrical Stimulation   Electrical Stimulation Location Lt medial elbow flexor forearm    Electrical Stimulation Action mAmp x5 min    Electrical Stimulation Parameters to tolerance     Electrical Stimulation Goals Pain;Tone      Manual Therapy   Manual therapy comments skilled palpation and monitoring of soft tissue during DN    Joint Mobilization GH joint mobs AP and inferior    Soft tissue mobilization Lt shoulder girdle pecs; upper trap; leveator; biceps; teres/lats    Scapular Mobilization Lt    Passive ROM Lt shoulder flexion; abduction in scapular plane; ER in scapular plane; shoulder extension; horizontal abduction in supine    Kinesiotex Inhibit Muscle      Kinesiotix   Inhibit Muscle  1 strip of tape along flexor forearm epicondyle to mid arm; 1 strp perpendicular above/below elbow    Facilitate Muscle  bilat postural correction facilitate posterior shoulder girdle 1 strip to inhibit upper trap; 1 strip ant GH to lateral scapular border inferiorly              Trigger Point Dry Needling - 11/16/21 0001     Consent Given? Yes    Education Handout Provided Previously provided    Printmaker Performed with Dry Needling Yes    E-stim with Dry Needling Details mAmp x 5 min Lt flexor forearm    Other Dry Needling Lt    Pectoralis Major Response Palpable increased muscle length;Twitch response elicited    Pectoralis Minor Response Palpable increased muscle length;Twitch response elicited    Biceps Response Palpable increased muscle length;Twitch response elicited    Flexor carpi ulnaris Response Palpable increased muscle length;Twitch response elicited                   PT Education - 11/16/21 0940     Education Details taping    Person(s) Educated Patient    Methods Explanation    Comprehension Verbalized understanding                 PT Long Term Goals - 10/21/21 1347       PT LONG TERM GOAL #1   Title Increase PROM/AROM Lt shoulder to >/= AROM Rt shoulder    Time 6    Period Weeks    Status On-going    Target Date 12/02/21      PT LONG TERM GOAL #2   Title Patient to demonstrate 5/5 strength Lt UE to perform  functional activities    Time 6    Period Weeks    Status On-going    Target Date 12/02/21  PT LONG TERM GOAL #3   Title Patient reports ability to return to work, leisure activities,and sleep without limitations and with minimal to no pain.    Time 6    Period Weeks    Status On-going    Target Date 12/02/21      PT LONG TERM GOAL #4   Title Independent in HEP    Time 6    Period Weeks    Status On-going    Target Date 12/02/21      PT LONG TERM GOAL #5   Title Improve functional score to 54    Time 6    Period Weeks    Status On-going    Target Date 12/02/21                   Plan - 11/16/21 0852     Clinical Impression Statement Persistnet soreness and some pain in the Lt shoulder and pain in the Lt medial elbow. Reviewed exercises. Note continued muscular tightness at medial epicondyle Lt elbow and biceps tendon/anterior Lt shoulder. Trial of DN and manual work anterior lt shoulder and medial Lt elbow. Manual work and passive stretching Lt shoulder and elbow. Trial of taping for Lt elbow/shoulder    Rehab Potential Good    PT Frequency 2x / week    PT Duration 8 weeks    PT Treatment/Interventions ADLs/Self Care Home Management;Aquatic Therapy;Cryotherapy;Electrical Stimulation;Iontophoresis '4mg'$ /ml Dexamethasone;Moist Heat;Ultrasound;Functional mobility training;Therapeutic exercise;Neuromuscular re-education;Patient/family education;Manual techniques;Passive range of motion;Dry needling;Taping;Vasopneumatic Device    PT Next Visit Plan progress AROM per protocol, manual and modalities as indicated; continue DN and manual work - patient will try more frequent gentle stretches during the day - assess response to DN/manual work/PROM at Lt elbow. 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 07/30/2020   Arthritis of first metatarsophalangeal (MTP) joint of right foot 07/02/2020   Occipital neuralgia 03/18/2020   Phlebitis 02/01/2020   Well adult exam 01/23/2020   Cervical radiculopathy 12/22/2019   Rectal bleeding 82/95/6213   Folliculitis 08/65/7846   Hydrocele 09/26/2017   Hyperglycemia 08/26/2014   Hyperkalemia 06/25/2014   Abnormal CT scan, small bowel 04/13/2014   Liver hemangioma 12/16/2012   Urinary frequency 09/02/2012   Bilateral inguinal hernia (BIH) 03/25/2012   Esophageal reflux disease 09/03/2011   Gastric ulcer due to Helicobacter pylori 96/29/5284   Chronic back pain greater than 3 months duration 09/03/2011   Neck pain, chronic 07/06/2011    Rocklin Soderquist Nilda Simmer, PT, MPH 11/16/2021, 9:53 AM  [facility] [place], [place], [address] Phone: [phone]   Fax:  [phone]  Name: Danny Cohen MRN: [id number] Date of Birth: 31-May-1966

## 2021-11-16 NOTE — Patient Instructions (Signed)

## 2021-11-23 ENCOUNTER — Other Ambulatory Visit: Payer: Self-pay

## 2021-11-23 ENCOUNTER — Encounter: Payer: Self-pay | Admitting: Rehabilitative and Restorative Service Providers"

## 2021-11-23 ENCOUNTER — Ambulatory Visit: Payer: No Typology Code available for payment source | Admitting: Rehabilitative and Restorative Service Providers"

## 2021-11-23 DIAGNOSIS — R293 Abnormal posture: Secondary | ICD-10-CM

## 2021-11-23 DIAGNOSIS — M542 Cervicalgia: Secondary | ICD-10-CM

## 2021-11-23 DIAGNOSIS — M25512 Pain in left shoulder: Secondary | ICD-10-CM

## 2021-11-23 DIAGNOSIS — R29898 Other symptoms and signs involving the musculoskeletal system: Secondary | ICD-10-CM

## 2021-11-23 NOTE — Therapy (Signed)
[facility] ?[place], [place], [address] ?Phone: [phone]   Fax:  [phone] ? ?Physical Therapy Treatment ? ?Patient Details  ?Name: Danny Cohen ?MRN: [id number] ?Date of Birth: 25-Jan-1966 ?Referring Provider (PT): Dr Ophelia Charter ? ? ?Encounter Date: 11/23/2021 ? ? PT End of Session - 11/23/21 0851   ? ? Visit Number 14   ? Number of Visits 22   ? Date for PT Re-Evaluation 12/02/21   ? PT Start Time [phone]   ? PT Stop Time [phone]   ? PT Time Calculation (min) 49 min   ? Activity Tolerance Patient tolerated treatment well   ? ?  ?  ? ?  ? ? ?Past Medical History:  ?Diagnosis Date  ? Arthritis   ? Chronic back pain greater than 3 months duration 09/03/2011  ? Esophageal reflux disease 09/03/2011  ? Gastric ulcer due to Helicobacter pylori 06/17/1218  ? Peptic ulcer due to Helicobacter pylori 75/88/3254  ? ? ?Past Surgical History:  ?Procedure Laterality Date  ? HERNIA REPAIR    ? LAPAROSCOPIC INGUINAL HERNIA REPAIR  04/23/2012  ? bilateral  ? SHOULDER ARTHROSCOPY WITH SUBACROMIAL DECOMPRESSION AND BICEP TENDON REPAIR Left 05/12/2021  ? Procedure: SHOULDER ARTHROSCOPY WITH SUBACROMIAL DECOMPRESSION AND BICEP TENDON REPAIR;  Surgeon: Hiram Gash, MD;  Location: [facility];  Service: Orthopedics;  Laterality: Left;  ? ? ?There were no vitals filed for this visit. ? ? Subjective Assessment - 11/23/21 0852   ? ? Subjective Patient reports that his shoulder is a little sore - from pushing his shoulder exercises a little more. Elbow needling and taping helped but symptoms returned when tape came off.   ? Currently in Pain? Yes   ? Pain Score 5    ? Pain Location Shoulder   ? Pain Orientation Left   ? Pain Descriptors / Indicators Sore;Aching   ? Pain Type Chronic pain   ? Pain Onset More than a month ago   ? Pain Frequency Intermittent   ? ?  ?  ? ?  ? ? ? ? ? [facility] PT Assessment - 11/23/21 0001   ? ?  ? Assessment  ? Medical Diagnosis Lt  shoulder SAD; biceps tendon repair   ? Referring Provider (PT) Dr Ophelia Charter   ? Onset Date/Surgical Date 05/12/21   ? Hand Dominance Right   ? Next MD Visit PRN   ? Prior Therapy here for neck and shoulder   ?  ? AROM  ? Right Shoulder Extension 40 Degrees   ? Right Shoulder Flexion 160 Degrees   ? Right Shoulder ABduction 155 Degrees   ? Right Shoulder External Rotation 112 Degrees   ? Left Shoulder Extension 41 Degrees   ? Left Shoulder Flexion 137 Degrees   ? Left Shoulder ABduction 133 Degrees   ? Left Shoulder Internal Rotation --   thumb T10  ? Left Shoulder External Rotation 80 Degrees   ?  ? PROM  ? Overall PROM Comments tightness with PROM ER in scapular plane; shoulder extension; horizontal abduction   ? ?  ?  ? ?  ? ? ? ? ? ? ? ? ? ? ? ? ? ? ? ? [facility] Adult PT Treatment/Exercise - 11/23/21 0001   ? ?  ? Shoulder Exercises: Supine  ? Other Supine Exercises chest lift 10 sec x 10 reps   ?  ? Shoulder Exercises: Standing  ? External Rotation Strengthening;Left;10 reps;Theraband   ?  Theraband Level (Shoulder External Rotation) Level 4 (Blue)   ? External Rotation Limitations reactive isometric step out x 10   ? Internal Rotation Strengthening;Left;10 reps;Theraband   ? Theraband Level (Shoulder Internal Rotation) Level 4 (Blue)   ? Internal Rotation Limitations reactive isometric step out x 10   ? Flexion AAROM;Left;10 reps   ? Flexion Limitations shoulder flexion at wall bilat 10 sec x 10 reps with lift off wall into flexion   ? ABduction Strengthening;Left;Weights   ? Shoulder ABduction Weight (lbs) 2   ? Other Standing Exercises serratus activation 5 sec x 10; shoulder clock x 5 reps each side   ?  ? Shoulder Exercises: ROM/Strengthening  ? UBE (Upper Arm Bike) L5 x 4.5 min 2.5 min fwd/2 back   ? Other ROM/Strengthening Exercises flexion on wall sliding arms up the wall and then lifting off while facing the wall x 10 reps 3 sec hold   ? Other ROM/Strengthening Exercises flexor forearm stretch sitting 15  sec hold arm forward and behind back x 3 reps each postion   ?  ? Shoulder Exercises: Stretch  ? Wall Stretch - ABduction Limitations arm at 90 deg elbow straight 30 sec x 3 repeated with elbow flexed at 90 deg 30 sec x 3 reps   ? Other Shoulder Stretches biceps stretch 20 sec x 3 reps   ? Other Shoulder Stretches doorway stretch to patient tolerance 3 positions 15-20 sec hold x 2 reps each position   ?  ? Moist Heat Therapy  ? Number Minutes Moist Heat 10 Minutes   ? Moist Heat Location Shoulder;Elbow   ?  ? Electrical Stimulation  ? Electrical Stimulation Location Lt medial elbow flexor forearm   ? Electrical Stimulation Action mAmpx 5 min   ? Electrical Stimulation Parameters to tolerance   ? Electrical Stimulation Goals Pain;Tone   ?  ? Manual Therapy  ? Manual therapy comments skilled palpation and monitoring of soft tissue during DN   ? Joint Mobilization GH joint mobs AP and inferior   ? Soft tissue mobilization Lt shoulder girdle pecs; upper trap; leveator; biceps; teres/lats   ? Scapular Mobilization Lt   ? Passive ROM Lt shoulder flexion; abduction in scapular plane; ER in scapular plane; shoulder extension; horizontal abduction in supine   ? Kinesiotex Inhibit Muscle   ?  ? Kinesiotix  ? Inhibit Muscle  1 strip of tape along flexor forearm epicondyle to mid arm; 1 strp perpendicular above/below elbow   ? Facilitate Muscle  bilat postural correction facilitate posterior shoulder girdle 1 strip to inhibit upper trap; 1 strip ant GH to lateral scapular border inferiorly   ? ?  ?  ? ?  ? ? ? Trigger Point Dry Needling - 11/23/21 0001   ? ? Consent Given? Yes   ? Education Handout Provided Previously provided   ? Electrical Stimulation Performed with Dry Needling Yes   ? E-stim with Dry Needling Details mAmp x 5 min Lt flexor forearm   ? Other Dry Needling Lt   ? Pectoralis Major Response Palpable increased muscle length;Twitch response elicited   ? Pectoralis Minor Response Palpable increased muscle  length;Twitch response elicited   ? Biceps Response Palpable increased muscle length;Twitch response elicited   ? Flexor carpi ulnaris Response Palpable increased muscle length;Twitch response elicited   ? ?  ?  ? ?  ? ? ? ? ? ? ? ? ? ? ? ? ? PT Long Term Goals - 10/21/21 1347   ? ?  ?  PT LONG TERM GOAL #1  ? Title Increase PROM/AROM Lt shoulder to >/= AROM Rt shoulder   ? Time 6   ? Period Weeks   ? Status On-going   ? Target Date 12/02/21   ?  ? PT LONG TERM GOAL #2  ? Title Patient to demonstrate 5/5 strength Lt UE to perform functional activities   ? Time 6   ? Period Weeks   ? Status On-going   ? Target Date 12/02/21   ?  ? PT LONG TERM GOAL #3  ? Title Patient reports ability to return to work, leisure activities,and sleep without limitations and with minimal to no pain.   ? Time 6   ? Period Weeks   ? Status On-going   ? Target Date 12/02/21   ?  ? PT LONG TERM GOAL #4  ? Title Independent in [place]   ? Time 6   ? Period Weeks   ? Status On-going   ? Target Date 12/02/21   ?  ? PT LONG TERM GOAL #5  ? Title Improve functional score to 54   ? Time 6   ? Period Weeks   ? Status On-going   ? Target Date 12/02/21   ? ?  ?  ? ?  ? ? ? ? ? ? ? ? Plan - 11/23/21 0918   ? ? Clinical Impression Statement Patient reports continued soreness and tightness in the Lt shoulder and medial elbow. He demonstrates increased Lt shoulder ROM and improving strength. He has persistent tightness to palpation through the anterior shoulder and medial elbow area.   ? Rehab Potential Good   ? PT Frequency 2x / week   ? PT Duration 8 weeks   ? PT Treatment/Interventions ADLs/Self Care Home Management;Aquatic Therapy;Cryotherapy;Electrical Stimulation;Iontophoresis '4mg'$ /ml Dexamethasone;Moist Heat;Ultrasound;Functional mobility training;Therapeutic exercise;Neuromuscular re-education;Patient/family education;Manual techniques;Passive range of motion;Dry needling;Taping;Vasopneumatic Device   ? PT Next Visit Plan progress AROM per protocol,  manual and modalities as indicated; continue DN and manual work - patient will try more frequent gentle stretches during the day - assess response to DN/manual work/PROM at Lt elbow. continue with taping for Lt sho

## 2021-11-30 ENCOUNTER — Ambulatory Visit: Payer: No Typology Code available for payment source | Admitting: Rehabilitative and Restorative Service Providers"

## 2021-11-30 ENCOUNTER — Encounter: Payer: Self-pay | Admitting: Rehabilitative and Restorative Service Providers"

## 2021-11-30 ENCOUNTER — Other Ambulatory Visit: Payer: Self-pay

## 2021-11-30 DIAGNOSIS — R29898 Other symptoms and signs involving the musculoskeletal system: Secondary | ICD-10-CM

## 2021-11-30 DIAGNOSIS — R293 Abnormal posture: Secondary | ICD-10-CM

## 2021-11-30 DIAGNOSIS — M25512 Pain in left shoulder: Secondary | ICD-10-CM | POA: Diagnosis not present

## 2021-11-30 NOTE — Therapy (Addendum)
[facility] [address] [place], [place], [address] Phone: [phone]   Fax:  [phone]  Physical Therapy Treatment  Patient Details  Name: Danny Cohen MRN: [id number] Date of Birth: Apr 13, 1966 Referring Provider (PT): Dr Ramond Marrow   Encounter Date: 11/30/2021   PT End of Session - 11/30/21 0847     Visit Number 15    Number of Visits 22    Date for PT Re-Evaluation 12/02/21    PT Start Time 0847    PT Stop Time 0937    PT Time Calculation (min) 50 min    Activity Tolerance Patient tolerated treatment well             Past Medical History:  Diagnosis Date   Arthritis    Chronic back pain greater than 3 months duration 09/03/2011   Esophageal reflux disease 09/03/2011   Gastric ulcer due to Helicobacter pylori 09/03/2011   Peptic ulcer due to Helicobacter pylori 07/06/2011    Past Surgical History:  Procedure Laterality Date   HERNIA REPAIR     LAPAROSCOPIC INGUINAL HERNIA REPAIR  04/23/2012   bilateral   SHOULDER ARTHROSCOPY WITH SUBACROMIAL DECOMPRESSION AND BICEP TENDON REPAIR Left 05/12/2021   Procedure: SHOULDER ARTHROSCOPY WITH SUBACROMIAL DECOMPRESSION AND BICEP TENDON REPAIR;  Surgeon: Bjorn Pippin, MD;  Location: [facility];  Service: Orthopedics;  Laterality: Left;    There were no vitals filed for this visit.   Subjective Assessment - 11/30/21 0848     Subjective Patient reports that he has had increased discomfort in the back of the shoulder over the past couple of days. Elbow was good until Monday when the tape came off. Symptoms have increased in the past couple of days as well. Still working on his exercises.    Currently in Pain? Yes    Pain Score 7     Pain Location Shoulder    Pain Orientation Left;Posterior    Pain Descriptors / Indicators Sore;Aching    Pain Type Chronic pain    Pain Onset More than a month ago    Pain Frequency Intermittent                 [facility] PT Assessment - 11/30/21 0001       Assessment   Medical Diagnosis Lt shoulder SAD; biceps tendon repair    Referring Provider (PT) Dr Ramond Marrow    Onset Date/Surgical Date 05/12/21    Hand Dominance Right    Next MD Visit PRN    Prior Therapy here for neck and shoulder      Palpation   Palpation comment muscular tightness Lt rhomboids/middle trap/upper trap                           [facility] Adult PT Treatment/Exercise - 11/30/21 0001       Shoulder Exercises: Prone   Retraction Strengthening;Both;10 reps    Retraction Limitations focus on scapular retraction arms at side      Shoulder Exercises: ROM/Strengthening   UBE (Upper Arm Bike) L5 x 4.5 min 2.5 min fwd/2 back    Other ROM/Strengthening Exercises flexor forearm stretch sitting 15 sec hold arm forward and behind back x 3 reps each postion      Shoulder Exercises: Stretch   Wall Stretch - ABduction Limitations arm at 90 deg elbow straight 30 sec x 3 repeated with elbow flexed at 90 deg 30 sec x 3  reps    Other Shoulder Stretches biceps stretch 20 sec x 3 reps    Other Shoulder Stretches doorway stretch to patient tolerance 3 positions 15-20 sec hold x 2 reps each position      Moist Heat Therapy   Number Minutes Moist Heat 10 Minutes    Moist Heat Location Shoulder;Elbow      Electrical Stimulation   Electrical Stimulation Location Lt rhomboid/middle trap and then medial elbow flexor forearm    Electrical Stimulation Action mAmp x 5 min    Electrical Stimulation Parameters to tolerance    Electrical Stimulation Goals Pain;Tone      Manual Therapy   Manual therapy comments skilled palpation and monitoring of soft tissue during DN    Joint Mobilization GH joint mobs AP and inferior    Soft tissue mobilization Lt shoulder girdle pecs; middle ad lower trap; leveator; medial elbow/forearm    Scapular Mobilization Lt    Kinesiotex Inhibit Muscle      Kinesiotix   Inhibit Muscle  1 strip  of tape along flexor forearm epicondyle to mid arm; 1 strp perpendicular above/below elbow    Facilitate Muscle  bilat postural correction facilitate posterior shoulder girdle 1 strip to inhibit upper trap; 1 strip ant GH to lateral scapular border inferiorly              Trigger Point Dry Needling - 11/30/21 0001     Consent Given? Yes    Education Handout Provided Previously provided    Statistician Performed with Dry Needling Yes    E-stim with Dry Needling Details mAmp x 5 min Lt rhomboids/middle trap repeated Lt flexor forearm    Other Dry Needling Lt    Rhomboids Response Palpable increased muscle length;Twitch response elicited    Middle trapezius Response Palpable increased muscle length;Twitch response elicited    Flexor carpi ulnaris Response Palpable increased muscle length;Twitch response elicited                   PT Education - 11/30/21 0919     Education Details HEP    Person(s) Educated Patient    Methods Explanation;Demonstration;Tactile cues;Verbal cues;Handout    Comprehension Verbalized understanding;Returned demonstration;Verbal cues required;Tactile cues required                 PT Long Term Goals - 10/21/21 1347       PT LONG TERM GOAL #1   Title Increase PROM/AROM Lt shoulder to >/= AROM Rt shoulder    Time 6    Period Weeks    Status On-going    Target Date 12/02/21      PT LONG TERM GOAL #2   Title Patient to demonstrate 5/5 strength Lt UE to perform functional activities    Time 6    Period Weeks    Status On-going    Target Date 12/02/21      PT LONG TERM GOAL #3   Title Patient reports ability to return to work, leisure activities,and sleep without limitations and with minimal to no pain.    Time 6    Period Weeks    Status On-going    Target Date 12/02/21      PT LONG TERM GOAL #4   Title Independent in HEP    Time 6    Period Weeks    Status On-going    Target Date 12/02/21      PT LONG TERM GOAL #5    Title Improve functional score  to 54    Time 6    Period Weeks    Status On-going    Target Date 12/02/21                   Plan - 11/30/21 0905     Clinical Impression Statement Patient reports increased pain and demonstrates increased tightness with palpation in the Lt posterior shoulder girdle including rhomboids; middle trap ; lower trap. He has continued forward posture and poor body mechanics. Added prone extension to recruit posterior shoulder girdle musculature. Good response to DN and manual work. PAtient will continue with HEP.    Rehab Potential Good    PT Frequency 2x / week    PT Duration 8 weeks    PT Treatment/Interventions ADLs/Self Care Home Management;Aquatic Therapy;Cryotherapy;Electrical Stimulation;Iontophoresis 4mg /ml Dexamethasone;Moist Heat;Ultrasound;Functional mobility training;Therapeutic exercise;Neuromuscular re-education;Patient/family education;Manual techniques;Passive range of motion;Dry needling;Taping;Vasopneumatic Device    PT Next Visit Plan assess response to work through the [employer]; progress AROM per protocol, manual and modalities as indicated; continue DN and manual work - patient will try more frequent gentle stretches during the day - assess response to DN/manual work/PROM at Lt elbow. 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 07/30/2020   Arthritis of first metatarsophalangeal (MTP) joint of right foot 07/02/2020    Occipital neuralgia 03/18/2020   Phlebitis 02/01/2020   Well adult exam 01/23/2020   Cervical radiculopathy 12/22/2019   Rectal bleeding 10/27/2019   Folliculitis 10/13/2019   Hydrocele 09/26/2017   Hyperglycemia 08/26/2014   Hyperkalemia 06/25/2014   Abnormal CT scan, small bowel 04/13/2014   Liver hemangioma 12/16/2012   Urinary frequency 09/02/2012   Bilateral inguinal hernia (BIH) 03/25/2012   Esophageal reflux disease 09/03/2011   Gastric ulcer due to Helicobacter pylori 09/03/2011   Chronic back pain greater than 3 months duration 09/03/2011   Neck pain, chronic 07/06/2011    Donnisha Besecker Rober Minion, PT, MPH 11/30/2021, 10:25 AM  [facility] [address] [place], [place], [address] Phone: [phone]   Fax:  [phone]  Name: Danny Cohen MRN: [id number] Date of Birth: October 30, 1965

## 2021-11-30 NOTE — Patient Instructions (Signed)
Access Code: [email or web address] ?URL: [email or web address] ?Date: 11/30/2021 ?Prepared by: Gillermo Murdoch ? ?Exercises ?Seated Scapular Retraction - 2 x daily - 7 x weekly - 1-2 sets - 10 reps - 10 sec hold ?Seated Shoulder External Rotation AAROM with Cane and Hand in Neutral - 1 x daily - 7 x weekly - 1 sets - 5-10 reps - 5-10 sec hold ?Shoulder Flexion Wall Slide with Towel - 1 x daily - 7 x weekly - 1 sets - 5-10 reps - 10 sec hold ?Supine Scapular Retraction - 2 x daily - 7 x weekly - 1 sets - 10 reps - 5-10 sec hold ?Shoulder External Rotation and Scapular Retraction with Resistance - 1 x daily - 7 x weekly - 3 sets - 10 reps ?Scapular Retraction with Resistance - 1 x daily - 7 x weekly - 3 sets - 10 reps ?Scapular Retraction with Resistance Advanced - 1 x daily - 7 x weekly - 3 sets - 10 reps ?Drawing Bow - 1 x daily - 7 x weekly - 1 sets - 10 reps - 3 sec hold ?Shoulder External Rotation Reactive Isometrics - 1 x daily - 7 x weekly - 1 sets - 10 reps - 3 sec hold ?Shoulder Internal Rotation Reactive Isometrics - 1 x daily - 7 x weekly - 1 sets - 10 reps - 3-5 sec hold ?Standing Bicep Curl with Dumbbells - 1 x daily - 7 x weekly - 1 sets - 10 reps - 3 sec hold ?Bicep Stretch at Table - 2 x daily - 7 x weekly - 1 sets - 3 reps - 30 sec hold ?Standing Shoulder Flexion Stretch on Wall - 2 x daily - 7 x weekly - 2 sets - 10 reps - 3 sec hold ?Doorway Pec Stretch at 60 Degrees Abduction - 3 x daily - 7 x weekly - 1 sets - 3 reps ?Doorway Pec Stretch at 90 Degrees Abduction - 3 x daily - 7 x weekly - 1 sets - 3 reps - 30 seconds hold ?Doorway Pec Stretch at 120 Degrees Abduction - 3 x daily - 7 x weekly - 1 sets - 3 reps - 30 second hold hold ?Standing Single Arm Shoulder External Rotation in Abduction with Anchored Resistance - 1 x daily - 7 x weekly - 1 sets - 10 reps - 3 sec hold ?Supine Chest Stretch with Elbows Bent - 1 x daily - 7 x weekly - 1 sets - 3 reps - 15-30 sec hold ?Supine Chest Stretch on Foam Roll  - 1 x daily - 7 x weekly - 1 sets - 1 reps - 2-5 min sec hold ?Shoulder ER Stretch in Abduction - 1 x daily - 7 x weekly - 1 sets - 3 reps - 30 sec hold ?Shoulder Flexion Serratus Activation with Resistance - 1 x daily - 7 x weekly - 1 sets - 10 reps - 3-5 sec hold ?Wall Clock with Theraband - 1 x daily - 7 x weekly - 1 sets - 10 reps - 2-3 sec hold ?Anterior Shoulder and Biceps Stretch - 2 x daily - 7 x weekly - 1 sets - 3 reps - 15-20 sec hold ?Prone Scapular Retraction - 2 x daily - 7 x weekly - 1 sets - 5-10 reps - 3-5 sec hold ? ?

## 2021-12-07 ENCOUNTER — Ambulatory Visit: Payer: No Typology Code available for payment source | Admitting: Rehabilitative and Restorative Service Providers"

## 2021-12-08 ENCOUNTER — Ambulatory Visit: Payer: No Typology Code available for payment source | Admitting: Rehabilitative and Restorative Service Providers"

## 2021-12-08 ENCOUNTER — Encounter: Payer: Self-pay | Admitting: Rehabilitative and Restorative Service Providers"

## 2021-12-08 DIAGNOSIS — R29898 Other symptoms and signs involving the musculoskeletal system: Secondary | ICD-10-CM

## 2021-12-08 DIAGNOSIS — M25512 Pain in left shoulder: Secondary | ICD-10-CM | POA: Diagnosis not present

## 2021-12-08 DIAGNOSIS — R293 Abnormal posture: Secondary | ICD-10-CM

## 2021-12-08 DIAGNOSIS — M542 Cervicalgia: Secondary | ICD-10-CM

## 2021-12-08 NOTE — Therapy (Addendum)
[facility] ?[facility] ?[place] ?[place], [place], [address] ?Phone: [phone]   Fax:  [phone] ? ?Physical Therapy Treatment ? ?Patient Details  ?Name: Danny Madariaga. Cohen ?MRN: [id number] ?Date of Birth: 09/10/1966 ?Referring Provider (PT): Dr Ophelia Charter ? ? ?Encounter Date: 12/08/2021 ? ? PT End of Session - 12/08/21 0809   ? ? Visit Number 16   ? Number of Visits 22   ? Date for PT Re-Evaluation 12/02/21   ? PT Start Time 0802   ? PT Stop Time 0850   ? PT Time Calculation (min) 48 min   ? Activity Tolerance Patient tolerated treatment well   ? ?  ?  ? ?  ? ? ?Past Medical History:  ?Diagnosis Date  ? Arthritis   ? Chronic back pain greater than 3 months duration 09/03/2011  ? Esophageal reflux disease 09/03/2011  ? Gastric ulcer due to Helicobacter pylori 27/02/2375  ? Peptic ulcer due to Helicobacter pylori 28/31/5176  ? ? ?Past Surgical History:  ?Procedure Laterality Date  ? HERNIA REPAIR    ? LAPAROSCOPIC INGUINAL HERNIA REPAIR  04/23/2012  ? bilateral  ? SHOULDER ARTHROSCOPY WITH SUBACROMIAL DECOMPRESSION AND BICEP TENDON REPAIR Left 05/12/2021  ? Procedure: SHOULDER ARTHROSCOPY WITH SUBACROMIAL DECOMPRESSION AND BICEP TENDON REPAIR;  Surgeon: Hiram Gash, MD;  Location: [facility];  Service: Orthopedics;  Laterality: Left;  ? ? ?There were no vitals filed for this visit. ? ? Subjective Assessment - 12/08/21 0809   ? ? Subjective Patient reports that he has had significant increase in pain Friday Saturday and Sunday. May have overdone the exercises. Better yesterday and this morning   ? Currently in Pain? Yes   ? Pain Score 2    ? Pain Location Shoulder   ? Pain Orientation Left;Posterior   ? Pain Descriptors / Indicators Sore;Aching   ? Pain Type Chronic pain   ? Pain Onset More than a month ago   ? ?  ?  ? ?  ? ? ? ? ? [facility] PT Assessment - 12/08/21 0001   ? ?  ? Assessment  ? Medical Diagnosis Lt shoulder SAD; biceps tendon repair   ?  Referring Provider (PT) Dr Ophelia Charter   ? Onset Date/Surgical Date 05/12/21   ? Hand Dominance Right   ? Next MD Visit PRN   ? Prior Therapy here for neck and shoulder   ?  ? Palpation  ? Palpation comment muscular tightness Lt rhomboids/middle trap/upper trap   ? ?  ?  ? ?  ? ? ? ? ? ? ? ? ? ? ? ? ? ? ? ? [facility] Adult PT Treatment/Exercise - 12/08/21 0001   ? ?  ? Shoulder Exercises: Supine  ? Other Supine Exercises chest lift 10 sec x 10 reps   ? Other Supine Exercises supine on coregeous ball T-spine arms at side and arms at ~ 60 deg abd   ?  ? Shoulder Exercises: Seated  ? Other Seated Exercises thoracic extension with coregeous ball   ?  ? Shoulder Exercises: Prone  ? Retraction Strengthening;Both;10 reps   5 sec hold  ? Retraction Limitations focus on scapular retraction arms at side   ?  ? Shoulder Exercises: ROM/Strengthening  ? UBE (Upper Arm Bike) L5 x 5 min 2.5 min fwd/2.5 back   ? Other ROM/Strengthening Exercises flexor forearm stretch sitting 15 sec hold arm forward and behind back x 3 reps each postion   ?  ?  Shoulder Exercises: Stretch  ? Wall Stretch - ABduction Limitations arm at 90 deg elbow straight 30 sec x 3 repeated with elbow flexed at 90 deg 30 sec x 3 reps   ? Other Shoulder Stretches biceps stretch 20 sec x 3 reps   ? Other Shoulder Stretches doorway stretch to patient tolerance 3 positions 15-20 sec hold x 2 reps each position   ?  ? Electrical Stimulation  ? Electrical Stimulation Location Lt rhomboid/middle trap and then medial elbow flexor forearm   ? Electrical Stimulation Action mAmp x 5 min   ? Electrical Stimulation Parameters to tolerance   ? Electrical Stimulation Goals Pain;Tone   ?  ? Manual Therapy  ? Manual therapy comments skilled palpation and monitoring of soft tissue during DN   ? Joint Mobilization GH joint mobs AP and inferior   ? Soft tissue mobilization Lt shoulder girdle pecs; middle ad lower trap; leveator; medial elbow/forearm   ? Scapular Mobilization Lt   ? Passive  ROM Lt shoulder flexion; abduction in scapular plane; ER in scapular plane; shoulder extension; horizontal abduction in supine   ? Kinesiotex Inhibit Muscle   ?  ? Kinesiotix  ? Inhibit Muscle  1 strip of tape along flexor forearm epicondyle to mid arm; 1 strp perpendicular above/below elbow   ? Facilitate Muscle  bilat postural correction facilitate posterior shoulder girdle 1 strip to inhibit upper trap; 1 strip ant GH to lateral scapular border inferiorly   ? ?  ?  ? ?  ? ? ? Trigger Point Dry Needling - 12/08/21 0001   ? ? Consent Given? Yes   ? Education Handout Provided Previously provided   ? Electrical Stimulation Performed with Dry Needling Yes   ? E-stim with Dry Needling Details mAmp x 5 min Lt rhomboids/middle trap repeated Lt flexor forearm   ? Other Dry Needling Lt   ? Rhomboids Response Palpable increased muscle length;Twitch response elicited   ? Middle trapezius Response Palpable increased muscle length;Twitch response elicited   ? ?  ?  ? ?  ? ? ? ? ? ? ? ? PT Education - 12/08/21 0911   ? ? Education Details HEP   ? Person(s) Educated Patient   ? Methods Explanation;Demonstration;Tactile cues;Verbal cues;Handout   ? Comprehension Verbalized understanding;Returned demonstration;Verbal cues required;Tactile cues required   ? ?  ?  ? ?  ? ? ? ? ? ? PT Long Term Goals - 10/21/21 1347   ? ?  ? PT LONG TERM GOAL #1  ? Title Increase PROM/AROM Lt shoulder to >/= AROM Rt shoulder   ? Time 6   ? Period Weeks   ? Status On-going   ? Target Date 12/02/21   ?  ? PT LONG TERM GOAL #2  ? Title Patient to demonstrate 5/5 strength Lt UE to perform functional activities   ? Time 6   ? Period Weeks   ? Status On-going   ? Target Date 12/02/21   ?  ? PT LONG TERM GOAL #3  ? Title Patient reports ability to return to work, leisure activities,and sleep without limitations and with minimal to no pain.   ? Time 6   ? Period Weeks   ? Status On-going   ? Target Date 12/02/21   ?  ? PT LONG TERM GOAL #4  ? Title  Independent in [place]   ? Time 6   ? Period Weeks   ? Status On-going   ? Target Date  12/02/21   ?  ? PT LONG TERM GOAL #5  ? Title Improve functional score to 54   ? Time 6   ? Period Weeks   ? Status On-going   ? Target Date 12/02/21   ? ?  ?  ? ?  ? ? ? ? ? ? ? ? Plan - 12/08/21 4332   ? ? Clinical Impression Statement Flare up of pain for several days over the weekend possibly due to increasing exercise. Continued tightness in the Lt rhomboid/middle trap area. Focus on this area with DN and manual work as well as some thoracic extension stretching and strengthening. Positive response to treatment. Discussed exercise frequency will decreased frequency and intensity of stretching.   ? Rehab Potential Good   ? PT Frequency 2x / week   ? PT Duration 8 weeks   ? PT Treatment/Interventions ADLs/Self Care Home Management;Aquatic Therapy;Cryotherapy;Electrical Stimulation;Iontophoresis '4mg'$ /ml Dexamethasone;Moist Heat;Ultrasound;Functional mobility training;Therapeutic exercise;Neuromuscular re-education;Patient/family education;Manual techniques;Passive range of motion;Dry needling;Taping;Vasopneumatic Device   ? PT Next Visit Plan assess response to work through the [employer]; progress AROM per protocol, manual and modalities as indicated; continue DN and manual work - patient will try more frequent gentle stretches during the day - continue DN/manual work/PROM at Lt elbow. continue with taping for Lt shoulder and medial elbow   ? PT Home Exercise Plan [email or web address]   ? Consulted and Agree with Plan of Care Patient   ? ?  ?  ? ?  ? ? ?Patient will benefit from skilled therapeutic intervention in order to improve the following deficits and impairments:    ? ?Visit Diagnosis: ?Left shoulder pain, unspecified chronicity ? ?Other symptoms and signs involving the musculoskeletal system ? ?Abnormal posture ? ?Cervicalgia ? ? ? ? ?Problem List ?Patient Active Problem List  ? Diagnosis Date Noted  ? 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 Arthritis of first metatarsophalangeal (MTP) joint of right foot 07/02/2020  ? Occipital neuralgia 03/18/2020  ? Phlebitis 02/01/2020  ? Well adult exam 01/23/2020

## 2021-12-08 NOTE — Patient Instructions (Signed)
Access Code: [email or web address] ?URL: [email or web address] ?Date: 12/08/2021 ?Prepared by: Gillermo Murdoch ? ?Exercises ?- Seated Scapular Retraction  - 2 x daily - 7 x weekly - 1-2 sets - 10 reps - 10 sec  hold ?- Seated Shoulder External Rotation AAROM with Cane and Hand in Neutral  - 1 x daily - 7 x weekly - 1 sets - 5-10 reps - 5-10 sec  hold ?- Shoulder Flexion Wall Slide with Towel  - 1 x daily - 7 x weekly - 1 sets - 5-10 reps - 10 sec  hold ?- Supine Scapular Retraction  - 2 x daily - 7 x weekly - 1 sets - 10 reps - 5-10 sec  hold ?- Shoulder External Rotation and Scapular Retraction with Resistance  - 1 x daily - 7 x weekly - 3 sets - 10 reps ?- Scapular Retraction with Resistance  - 1 x daily - 7 x weekly - 3 sets - 10 reps ?- Scapular Retraction with Resistance Advanced  - 1 x daily - 7 x weekly - 3 sets - 10 reps ?- Drawing Bow  - 1 x daily - 7 x weekly - 1 sets - 10 reps - 3 sec  hold ?- Shoulder External Rotation Reactive Isometrics  - 1 x daily - 7 x weekly - 1 sets - 10 reps - 3 sec  hold ?- Shoulder Internal Rotation Reactive Isometrics  - 1 x daily - 7 x weekly - 1 sets - 10 reps - 3-5 sec  hold ?- Standing Bicep Curl with Dumbbells  - 1 x daily - 7 x weekly - 1 sets - 10 reps - 3 sec  hold ?- Bicep Stretch at Table  - 2 x daily - 7 x weekly - 1 sets - 3 reps - 30 sec  hold ?- Standing Shoulder Flexion Stretch on Wall  - 2 x daily - 7 x weekly - 2 sets - 10 reps - 3 sec  hold ?- Doorway Pec Stretch at 60 Degrees Abduction  - 3 x daily - 7 x weekly - 1 sets - 3 reps ?- Doorway Pec Stretch at 90 Degrees Abduction  - 3 x daily - 7 x weekly - 1 sets - 3 reps - 30 seconds  hold ?- Doorway Pec Stretch at 120 Degrees Abduction  - 3 x daily - 7 x weekly - 1 sets - 3 reps - 30 second hold  hold ?- Standing Single Arm Shoulder External Rotation in Abduction with Anchored Resistance  - 1 x daily - 7 x weekly - 1 sets - 10 reps - 3 sec  hold ?- Supine Chest Stretch with Elbows Bent  - 1 x daily - 7 x weekly -  1 sets - 3 reps - 15-30 sec  hold ?- Supine Chest Stretch on Foam Roll  - 1 x daily - 7 x weekly - 1 sets - 1 reps - 2-5 min  sec  hold ?- Shoulder ER Stretch in Abduction  - 1 x daily - 7 x weekly - 1 sets - 3 reps - 30 sec  hold ?- Shoulder Flexion Serratus Activation with Resistance  - 1 x daily - 7 x weekly - 1 sets - 10 reps - 3-5 sec  hold ?- Wall Clock with Theraband  - 1 x daily - 7 x weekly - 1 sets - 10 reps - 2-3 sec  hold ?- Anterior Shoulder and Biceps Stretch  - 2 x daily - 7 x weekly -  1 sets - 3 reps - 15-20 sec  hold ?- Prone Scapular Retraction  - 2 x daily - 7 x weekly - 1 sets - 5-10 reps - 3-5 sec  hold ?- Prone Scapular Retraction  - 1 x daily - 7 x weekly - 1-2 sets - 5-10 reps - 3-5 sec  hold ?

## 2021-12-14 ENCOUNTER — Ambulatory Visit
Payer: No Typology Code available for payment source | Attending: Orthopaedic Surgery | Admitting: Rehabilitative and Restorative Service Providers"

## 2021-12-14 ENCOUNTER — Encounter: Payer: Self-pay | Admitting: Rehabilitative and Restorative Service Providers"

## 2021-12-14 DIAGNOSIS — R29898 Other symptoms and signs involving the musculoskeletal system: Secondary | ICD-10-CM

## 2021-12-14 DIAGNOSIS — M542 Cervicalgia: Secondary | ICD-10-CM | POA: Diagnosis present

## 2021-12-14 DIAGNOSIS — M25512 Pain in left shoulder: Secondary | ICD-10-CM

## 2021-12-14 DIAGNOSIS — R293 Abnormal posture: Secondary | ICD-10-CM

## 2021-12-14 NOTE — Addendum Note (Signed)
Addended by: Everardo All on: 12/14/2021 08:45 AM ? ? Modules accepted: Orders ? ?

## 2021-12-14 NOTE — Therapy (Addendum)
[facility] ?[facility] ?[place] ?[place], [place], [address] ?Phone: [phone]   Fax:  [phone] ? ?Physical Therapy Treatment ? ?Patient Details  ?Name: Danny Cohen ?MRN: [id number] ?Date of Birth: 05-05-1966 ?Referring Provider (PT): Dr Ophelia Charter ? ? ?Encounter Date: 12/14/2021 ? ? PT End of Session - 12/14/21 0801   ? ? Visit Number 17   ? Number of Visits 22   ? Date for PT Re-Evaluation 01/27/22   ? PT Start Time [phone]   ? PT Stop Time 6761   ? PT Time Calculation (min) 48 min   ? Activity Tolerance Patient tolerated treatment well   ? ?  ?  ? ?  ? ? ?Past Medical History:  ?Diagnosis Date  ? Arthritis   ? Chronic back pain greater than 3 months duration 09/03/2011  ? Esophageal reflux disease 09/03/2011  ? Gastric ulcer due to Helicobacter pylori 95/05/3266  ? Peptic ulcer due to Helicobacter pylori 12/45/8099  ? ? ?Past Surgical History:  ?Procedure Laterality Date  ? HERNIA REPAIR    ? LAPAROSCOPIC INGUINAL HERNIA REPAIR  04/23/2012  ? bilateral  ? SHOULDER ARTHROSCOPY WITH SUBACROMIAL DECOMPRESSION AND BICEP TENDON REPAIR Left 05/12/2021  ? Procedure: SHOULDER ARTHROSCOPY WITH SUBACROMIAL DECOMPRESSION AND BICEP TENDON REPAIR;  Surgeon: Hiram Gash, MD;  Location: [facility];  Service: Orthopedics;  Laterality: Left;  ? ? ?There were no vitals filed for this visit. ? ? Subjective Assessment - 12/14/21 0802   ? ? Subjective Shoulder is feeling a little better. Neck is irritated today. Elbow does well with the taping and he can tape at home.   ? Currently in Pain? No/denies   ? Pain Score 0-No pain   ? Pain Location Shoulder   ? Pain Orientation Left;Posterior   ? Pain Descriptors / Indicators Sore   ? Pain Type Chronic pain   ? Pain Onset More than a month ago   ? Pain Frequency Intermittent   ? ?  ?  ? ?  ? ? ? ? ? [facility] PT Assessment - 12/14/21 0001   ? ?  ? Assessment  ? Medical Diagnosis Lt shoulder SAD; biceps tendon repair   ?  Referring Provider (PT) Dr Ophelia Charter   ? Onset Date/Surgical Date 05/12/21   ? Hand Dominance Right   ? Next MD Visit PRN   ? Prior Therapy here for neck and shoulder   ?  ? Palpation  ? Palpation comment muscular tightness Lt rhomboids/middle trap/upper trap - less palpable tightness noted   ? ?  ?  ? ?  ? ? ? ? ? ? ? ? ? ? ? ? ? ? ? ? [facility] Adult PT Treatment/Exercise - 12/14/21 0001   ? ?  ? Shoulder Exercises: Supine  ? Other Supine Exercises chest lift 10 sec x 10 reps   ? Other Supine Exercises supine on coregeous ball T-spine arms at side and arms at ~ 60 deg abd   ?  ? Shoulder Exercises: Seated  ? Other Seated Exercises thoracic extension with coregeous ball   ?  ? Shoulder Exercises: Prone  ? Retraction Strengthening;Both;10 reps   5 sec hold  ? Retraction Limitations focus on scapular retraction arms at side   ?  ? Shoulder Exercises: ROM/Strengthening  ? UBE (Upper Arm Bike) L5 x 5 min 2.5 min fwd/2.5 back   ? Other ROM/Strengthening Exercises flexor forearm stretch sitting 15 sec hold arm forward  and behind back x 3 reps each postion   ?  ? Shoulder Exercises: Stretch  ? Other Shoulder Stretches biceps stretch 20 sec x 3 reps   ? Other Shoulder Stretches doorway stretch to patient tolerance 3 positions 15-20 sec hold x 2 reps each position   ?  ? Electrical Stimulation  ? Electrical Stimulation Location Lt rhomboid/middle trap and then medial elbow flexor forearm   ? Electrical Stimulation Action mAmp x 5 min   ? Electrical Stimulation Parameters to tolerance   ? Electrical Stimulation Goals Pain;Tone   ?  ? Manual Therapy  ? Manual therapy comments skilled palpation and monitoring of soft tissue during DN   ? Joint Mobilization GH joint mobs AP and inferior   ? Soft tissue mobilization Lt shoulder girdle pecs; middle ad lower trap; leveator; medial elbow/forearm   ? Scapular Mobilization Lt   ? Passive ROM Lt shoulder flexion; abduction in scapular plane; ER in scapular plane; shoulder extension;  horizontal abduction in supine   ? Kinesiotex Inhibit Muscle   ?  ? Kinesiotix  ? Inhibit Muscle  1 strip of tape along flexor forearm epicondyle to mid arm; 1 strp perpendicular above/below elbow   ? Facilitate Muscle  bilat postural correction facilitate posterior shoulder girdle 1 strip to inhibit upper trap; 1 strip ant GH to lateral scapular border inferiorly   ? ?  ?  ? ?  ? ? ? Trigger Point Dry Needling - 12/14/21 0001   ? ? Consent Given? Yes   ? Education Handout Provided Previously provided   ? Electrical Stimulation Performed with Dry Needling Yes   ? E-stim with Dry Needling Details mAmp x 5 min Lt rhomboids/middle trap repeated Lt flexor forearm   ? Other Dry Needling Lt   ? Rhomboids Response Palpable increased muscle length;Twitch response elicited   ? Middle trapezius Response Palpable increased muscle length;Twitch response elicited   ? ?  ?  ? ?  ? ? ? ? ? ? ? ? ? ? ? ? ? PT Long Term Goals - 12/14/21 0839   ? ?  ? PT LONG TERM GOAL #1  ? Title Increase PROM/AROM Lt shoulder to >/= AROM Rt shoulder   ? Time 8   ? Period Weeks   ? Status Partially Met   ? Target Date 01/27/22   ?  ? PT LONG TERM GOAL #2  ? Title Patient to demonstrate 5/5 strength Lt UE to perform functional activities   ? Time 8   ? Period Weeks   ? Status Partially Met   ? Target Date 01/27/22   ?  ? PT LONG TERM GOAL #3  ? Title Patient reports ability to return to work, leisure activities,and sleep without limitations and with minimal to no pain.   ? Time 8   ? Period Weeks   ? Status Partially Met   ? Target Date 01/27/22   ?  ? PT LONG TERM GOAL #4  ? Title Independent in [place]   ? Time 8   ? Period Weeks   ? Status Partially Met   ? Target Date 01/27/22   ?  ? PT LONG TERM GOAL #5  ? Title Improve functional score to 54   ? Time 8   ? Period Weeks   ? Status Partially Met   ? Target Date 01/27/22   ? ?  ?  ? ?  ? ? ? ? ? ? ? ? Plan -  12/14/21 0816   ? ? Clinical Impression Statement Shoulder is improving with patient reporting  less pain and tightness in the Lt posterior shoulder. Some continued muscular tightness noted through the Lt rhomboids, middle trap. Positive response to current treatment.   ? Rehab Potential Good   ? PT Frequency 2x / week   ? PT Duration 8 weeks   ? PT Treatment/Interventions ADLs/Self Care Home Management;Aquatic Therapy;Cryotherapy;Electrical Stimulation;Iontophoresis 21m/ml Dexamethasone;Moist Heat;Ultrasound;Functional mobility training;Therapeutic exercise;Neuromuscular re-education;Patient/family education;Manual techniques;Passive range of motion;Dry needling;Taping;Vasopneumatic Device   ? PT Next Visit Plan assess response to work through the [employer] progress AROM per protocol, manual and modalities as indicated; continue DN and manual work - patient will try more frequent gentle stretches during the day - continue DN/manual work/PROM at Lt elbow. continue with taping for Lt shoulder and medial elbow   ? PT Home Exercise Plan [email or web address]   ? Consulted and Agree with Plan of Care Patient   ? ?  ?  ? ?  ? ? ?Patient will benefit from skilled therapeutic intervention in order to improve the following deficits and impairments:    ? ?Visit Diagnosis: ?Left shoulder pain, unspecified chronicity ? ?Other symptoms and signs involving the musculoskeletal system ? ?Abnormal posture ? ?Cervicalgia ? ?Weakness of left upper extremity ? ? ? ? ?Problem List ?Patient Active Problem List  ? Diagnosis Date Noted  ? 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 Arthritis of first metatarsophalangeal (MTP) joint of right foot 07/02/2020  ? Occipital neuralgia 03/18/2020  ? Phlebitis 02/01/2020  ? Well adult exam 01/23/2020  ? Cervical radiculopathy 12/22/2019  ? Rectal bleeding 10/27/2019  ? Folliculitis 082/64/1583 ? Hydrocele 09/26/2017  ? Hyperglycemia 08/26/2014  ? Hyperkalemia 06/25/2014  ? Abnormal CT scan, small bowel 04/13/2014  ? Liver hemangioma 12/16/2012  ? Urinary  frequency 09/02/2012  ? Bilateral inguinal hernia (BIH) 03/25/2012  ? Esophageal reflux disease 09/03/2011  ? Gastric ulcer due to Helicobacter pylori 109/40/7680 ? Chronic back pain greater than 3 months duration 12/

## 2021-12-21 ENCOUNTER — Ambulatory Visit: Payer: No Typology Code available for payment source | Admitting: Rehabilitative and Restorative Service Providers"

## 2021-12-21 ENCOUNTER — Encounter: Payer: Self-pay | Admitting: Rehabilitative and Restorative Service Providers"

## 2021-12-21 DIAGNOSIS — M25512 Pain in left shoulder: Secondary | ICD-10-CM | POA: Diagnosis not present

## 2021-12-21 DIAGNOSIS — R29898 Other symptoms and signs involving the musculoskeletal system: Secondary | ICD-10-CM

## 2021-12-21 DIAGNOSIS — R293 Abnormal posture: Secondary | ICD-10-CM

## 2021-12-21 DIAGNOSIS — M542 Cervicalgia: Secondary | ICD-10-CM

## 2021-12-21 NOTE — Therapy (Addendum)
[place] [facility] [place], [place], [address] Phone: [phone]   Fax:  [phone]  Physical Therapy Treatment and Discharge Summary   PHYSICAL THERAPY DISCHARGE SUMMARY  Visits from Start of Care: 18  Current functional level related to goals / functional outcomes: See progress note for discharge status    Remaining deficits: Continued left shoulder pain on an intermittent basis    Education / Equipment: HEP    Patient agrees to discharge. Patient goals were partially met. Patient is being discharged due to not returning since the last visit. Betty Daidone P. Helene Kelp PT, MPH 02/07/22 10:41 AM   Patient Details  Name: Danny Cohen. Leard MRN: [id number] Date of Birth: 1966-05-27 Referring Provider (PT): Dr Ophelia Charter   Encounter Date: 12/21/2021   PT End of Session - 12/21/21 0759     Visit Number 18    Number of Visits 22    Date for PT Re-Evaluation 01/27/22    PT Start Time 0758    PT Stop Time 0847    PT Time Calculation (min) 49 min    Activity Tolerance Patient tolerated treatment well             Past Medical History:  Diagnosis Date   Arthritis    Chronic back pain greater than 3 months duration 09/03/2011   Esophageal reflux disease 09/03/2011   Gastric ulcer due to Helicobacter pylori 88/50/2774   Peptic ulcer due to Helicobacter pylori 12/87/8676    Past Surgical History:  Procedure Laterality Date   HERNIA REPAIR     LAPAROSCOPIC INGUINAL HERNIA REPAIR  04/23/2012   bilateral   SHOULDER ARTHROSCOPY WITH SUBACROMIAL DECOMPRESSION AND BICEP TENDON REPAIR Left 05/12/2021   Procedure: SHOULDER ARTHROSCOPY WITH SUBACROMIAL DECOMPRESSION AND BICEP TENDON REPAIR;  Surgeon: Hiram Gash, MD;  Location: [facility];  Service: Orthopedics;  Laterality: Left;    There were no vitals filed for this visit.   Subjective Assessment - 12/21/21 0800     Subjective Some good days some not as  good. shoulder feels good - good mobility and strength in the shoulder. Has pain in the upper back area which has been there since before surgery. He has pain in the Lt inside elbow area which is sometimes worse than others depending on the activity level.    Currently in Pain? Yes    Pain Score 3     Pain Location Shoulder    Pain Orientation Left;Posterior    Pain Descriptors / Indicators Sore    Pain Type Chronic pain                [facility] PT Assessment - 12/21/21 0001       Assessment   Medical Diagnosis Lt shoulder SAD; biceps tendon repair    Referring Provider (PT) Dr Ophelia Charter    Onset Date/Surgical Date 05/12/21    Hand Dominance Right    Next MD Visit PRN    Prior Therapy here for neck and shoulder      AROM   Right/Left Shoulder --   assesses in standing   Right Shoulder Extension 40 Degrees    Right Shoulder Flexion 160 Degrees    Right Shoulder ABduction 155 Degrees    Right Shoulder External Rotation 112 Degrees    Left Shoulder Extension 41 Degrees    Left Shoulder Flexion 140 Degrees    Left Shoulder ABduction 135 Degrees    Left Shoulder External Rotation 75 Degrees  PROM   Overall PROM Comments assessed supine in scapular plane    Left Shoulder Extension 53 Degrees    Left Shoulder Flexion 156 Degrees    Left Shoulder ABduction 162 Degrees    Left Shoulder External Rotation 80 Degrees      Strength   Left Shoulder Flexion 5/5    Left Shoulder Extension 5/5    Left Shoulder ABduction 5/5    Left Shoulder Internal Rotation 5/5    Left Shoulder External Rotation 5/5      Palpation   Palpation comment muscular tightness Lt rhomboids/middle trap/upper trap - musculature in flexor forearm/medial epicondyle                           [facility] Adult PT Treatment/Exercise - 12/21/21 0001       Shoulder Exercises: Supine   Other Supine Exercises chest lift 10 sec x 10 reps    Other Supine Exercises supine on coregeous ball T-spine  arms at side and arms at ~ 60 deg abd      Shoulder Exercises: Seated   Other Seated Exercises thoracic extension with coregeous ball      Shoulder Exercises: Prone   Retraction Strengthening;Both;10 reps   5 sec hold   Retraction Limitations focus on scapular retraction arms at side      Shoulder Exercises: Standing   External Rotation Strengthening;Left;10 reps;Theraband    Theraband Level (Shoulder External Rotation) Level 4 (Blue)    External Rotation Limitations reactive isometric step out x 10    Internal Rotation Strengthening;Left;10 reps;Theraband    Theraband Level (Shoulder Internal Rotation) Level 4 (Blue)    Flexion AAROM;Left;10 reps    Shoulder Flexion Weight (lbs) 2    ABduction Strengthening;Left;Weights    Shoulder ABduction Weight (lbs) 2    Extension Strengthening    Theraband Level (Shoulder Extension) Level 4 (Blue)    Row Strengthening;Both;10 reps;Theraband    Theraband Level (Shoulder Row) Level 4 (Blue)    Row Limitations step back reactive x 10; bow and arrow x 10 each side blue TB    Retraction Strengthening;Both;10 reps;Theraband    Theraband Level (Shoulder Retraction) Level 2 (Red)    Other Standing Exercises biceps curl 2# x 10 bilat      Shoulder Exercises: ROM/Strengthening   UBE (Upper Arm Bike) L5 x 5 min 2.5 min fwd/2.5 back    Other ROM/Strengthening Exercises eccentric wrist flexion green TB elbow at 90 deg and 40 deg flexon 10 reps x 2 sets each position    Other ROM/Strengthening Exercises flexor forearm stretch sitting 15 sec hold arm forward and behind back x 3 reps each postion      Shoulder Exercises: Stretch   Wall Stretch - ABduction Limitations arm at 90 deg elbow straight 30 sec x 3 repeated with elbow flexed at 90 deg 30 sec x 3 reps    Other Shoulder Stretches biceps stretch 20 sec x 3 reps    Other Shoulder Stretches doorway stretch to patient tolerance 3 positions 15-20 sec hold x 2 reps each position      [employer]    Inhibit Muscle  1 strip of tape along flexor forearm epicondyle to mid arm; 1 strp perpendicular above/below elbow                       PT Short Term Goals - 12/21/21 0957       PT SHORT TERM GOAL #  1   Title Will be compliant with appropriate HEP    Time 4    Period Weeks    Status Achieved    Target Date 06/16/21      PT SHORT TERM GOAL #2   Title Patient to report decreased post op pain with pain no greater than 2-3/10 on 1-10 scale    Time 4    Period Weeks    Status Partially Met    Target Date 06/16/21      PT SHORT TERM GOAL #3   Title Instruct in proper posture and alignment engaging posterior shoulder musculature for all exercises    Time 4    Period Weeks    Status Achieved    Target Date 06/16/21               PT Long Term Goals - 12/21/21 0958       PT LONG TERM GOAL #1   Title Increase PROM/AROM Lt shoulder to >/= AROM Rt shoulder    Time 8    Period Weeks    Status Achieved    Target Date 01/27/22      PT LONG TERM GOAL #2   Title Patient to demonstrate 5/5 strength Lt UE to perform functional activities    Time 8    Period Weeks    Status Achieved      PT LONG TERM GOAL #3   Title Patient reports ability to return to work, leisure activities,and sleep without limitations and with minimal to no pain.    Time 8    Period Weeks    Status Partially Met    Target Date 01/27/22      PT LONG TERM GOAL #4   Title Independent in HEP    Time 8    Status Achieved    Target Date 01/27/22      PT LONG TERM GOAL #5   Title Improve functional score to 54    Time 8    Period Weeks    Status Achieved    Target Date 01/27/22                   Plan - 12/21/21 0803     Clinical Impression Statement Patient reports that he has some increased soreness and discomfort in the elbow and posterior shoulder area with no known cause. He continues to work on stretching and some of the theraband exercises on a daily basis. He had some  difficulty sleeping. Palpable tightness in the Lt rhomboid/middle trap area and into the medial elbow. Goals of therapy have been partially accomplished. Patient will contact MD re- persistent pain in the Lt medial elbow and pain in the Lt rhomboid/middle trap area.    Rehab Potential Good    PT Frequency 2x / week    PT Duration 8 weeks    PT Treatment/Interventions ADLs/Self Care Home Management;Aquatic Therapy;Cryotherapy;Electrical Stimulation;Iontophoresis 28m/ml Dexamethasone;Moist Heat;Ultrasound;Functional mobility training;Therapeutic exercise;Neuromuscular re-education;Patient/family education;Manual techniques;Passive range of motion;Dry needling;Taping;Vasopneumatic Device    PT Next Visit Plan continue work through the [employer] progress AROM per protocol, manual and modalities as indicated; continue DN and manual work - patient will will continue with day - continue DN/manual work/PROM at Lt elbow. 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 07/30/2020   Arthritis of first metatarsophalangeal (MTP) joint of right foot 07/02/2020   Occipital neuralgia 03/18/2020   Phlebitis 02/01/2020   Well adult exam 01/23/2020   Cervical radiculopathy 12/22/2019   Rectal bleeding 90/17/2419   Folliculitis 54/24/8144   Hydrocele 09/26/2017   Hyperglycemia 08/26/2014   Hyperkalemia 06/25/2014   Abnormal CT scan, small bowel  04/13/2014   Liver hemangioma 12/16/2012   Urinary frequency 09/02/2012   Bilateral inguinal hernia (BIH) 03/25/2012   Esophageal reflux disease 09/03/2011   Gastric ulcer due to Helicobacter pylori 39/26/5997   Chronic back pain greater than 3 months duration 09/03/2011   Neck pain, chronic 07/06/2011    Chauntae Hults Nilda Simmer, PT, MPH  12/21/2021, 10:02 AM  [facility] [address] [place], [place], [address] Phone: [phone]   Fax:  [phone]  Name: Ulas Zuercher. Coye MRN: [id number] Date of Birth: [birth year]

## 2021-12-22 ENCOUNTER — Other Ambulatory Visit: Payer: Self-pay | Admitting: Sports Medicine

## 2021-12-22 DIAGNOSIS — M5412 Radiculopathy, cervical region: Secondary | ICD-10-CM

## 2021-12-22 DIAGNOSIS — M19071 Primary osteoarthritis, right ankle and foot: Secondary | ICD-10-CM

## 2021-12-27 ENCOUNTER — Encounter: Payer: No Typology Code available for payment source | Admitting: Rehabilitative and Restorative Service Providers"

## 2022-01-04 ENCOUNTER — Ambulatory Visit: Payer: No Typology Code available for payment source | Admitting: Rehabilitative and Restorative Service Providers"

## 2022-02-01 ENCOUNTER — Telehealth (INDEPENDENT_AMBULATORY_CARE_PROVIDER_SITE_OTHER): Payer: No Typology Code available for payment source | Admitting: Family Medicine

## 2022-02-01 ENCOUNTER — Encounter: Payer: Self-pay | Admitting: Family Medicine

## 2022-02-01 DIAGNOSIS — M542 Cervicalgia: Secondary | ICD-10-CM | POA: Diagnosis not present

## 2022-02-01 DIAGNOSIS — G8929 Other chronic pain: Secondary | ICD-10-CM | POA: Diagnosis not present

## 2022-02-01 DIAGNOSIS — M5412 Radiculopathy, cervical region: Secondary | ICD-10-CM

## 2022-02-01 DIAGNOSIS — M19071 Primary osteoarthritis, right ankle and foot: Secondary | ICD-10-CM

## 2022-02-01 MED ORDER — CELECOXIB 100 MG PO CAPS: ORAL_CAPSULE | ORAL | 3 refills | Status: AC

## 2022-02-01 MED ORDER — GABAPENTIN 600 MG PO TABS: ORAL_TABLET | ORAL | 3 refills | Status: AC

## 2022-02-01 MED ORDER — METHOCARBAMOL 500 MG PO TABS: 500.0000 mg | ORAL_TABLET | Freq: Three times a day (TID) | ORAL | 3 refills | Status: AC | PRN

## 2022-02-01 NOTE — Progress Notes (Signed)
Neck painful in 2 places. Possible restart of muscle relaxer. Shoulder still giving pain following surgery.

## 2022-02-01 NOTE — Progress Notes (Signed)
Danny Cohen. Danny Cohen - 56 y.o. male MRN [id number]  Date of birth: 04/26/1966   This visit type was conducted due to national recommendations for restrictions regarding the COVID-19 Pandemic (e.g. social distancing).  This format is felt to be most appropriate for this patient at this time.  All issues noted in this document were discussed and addressed.  No physical exam was performed (except for noted visual exam findings with Video Visits).  I discussed the limitations of evaluation and management by telemedicine and the availability of in person appointments. The patient expressed understanding and agreed to proceed.  I connected withNAME@ on 02/01/22 at  2:30 PM EDT by a video enabled telemedicine application and verified that I am speaking with the correct person using two identifiers.  Present at visit: Luetta Nutting, DO Danny Cohen. [facility]   Patient Location: Home [place] [address]   Provider location:   [place]  No chief complaint on file.   HPI  [place] Danny Cohen is a 56 y.o. male who presents via audio/video conferencing for a telehealth visit today.  He is following up on chronic neck pain.  Continues to have radicular neck pain.  Seen by neurosurgery again in December.  Recommend restarting PT and continue medical management.  He is needing renewal on gabapentin, celebrex and muscle relaxer.  These remain pretty effective for management of his symptoms.  He has not noted any significant side effects from medications.     ROS:  A comprehensive ROS was completed and negative except as noted per HPI  Past Medical History:  Diagnosis Date   Arthritis    Chronic back pain greater than 3 months duration 09/03/2011   Esophageal reflux disease 09/03/2011   Gastric ulcer due to Helicobacter pylori 56/97/9480   Peptic ulcer due to Helicobacter pylori 16/55/3748    Past Surgical History:  Procedure Laterality Date   HERNIA REPAIR     LAPAROSCOPIC INGUINAL  HERNIA REPAIR  04/23/2012   bilateral   SHOULDER ARTHROSCOPY WITH SUBACROMIAL DECOMPRESSION AND BICEP TENDON REPAIR Left 05/12/2021   Procedure: SHOULDER ARTHROSCOPY WITH SUBACROMIAL DECOMPRESSION AND BICEP TENDON REPAIR;  Surgeon: Hiram Gash, MD;  Location: [facility];  Service: Orthopedics;  Laterality: Left;    Family History  Problem Relation Age of Onset   Heart failure Mother     Social History   Socioeconomic History   Marital status: Married    Spouse name: Not on file   Number of children: Not on file   Years of education: Not on file   Highest education level: Not on file  Occupational History   Not on file  Tobacco Use   Smoking status: Former    Types: Cigarettes    Quit date: 09/11/2000    Years since quitting: 21.4   Smokeless tobacco: Never  Substance and Sexual Activity   Alcohol use: No   Drug use: No   Sexual activity: Yes  Other Topics Concern   Not on file  Social History Narrative   Not on file   Social Determinants of Health   Financial Resource Strain: Not on file  Food Insecurity: Not on file  Transportation Needs: Not on file  Physical Activity: Not on file  Stress: Not on file  Social Connections: Not on file  Intimate Partner Violence: Not on file     Current Outpatient Medications:    doxycycline (VIBRA-TABS) 100 MG tablet, TAKE 1 TABLET BY MOUTH EVERY DAY,  Disp: 30 tablet, Rfl: 3   esomeprazole (NEXIUM) 40 MG capsule, TAKE AT THE SAME TIME IS TAKING CELEBREX (1-2 TIMES DAILY), Disp: 180 capsule, Rfl: 1   celecoxib (CELEBREX) 100 MG capsule, AT THE START OF THERAPY TAKE 1 TO 2 CAPSULES SCHEDULED DAILY FOR 2 WEEKS THEN 1 TO 2 CAPSULES AS NEEDED DAILY THEREAFTER, Disp: 180 capsule, Rfl: 3   gabapentin (NEURONTIN) 600 MG tablet, 1 tab in the morning, 1 tab midday, 2 tabs in the evening, Disp: 360 tablet, Rfl: 3   methocarbamol (ROBAXIN) 500 MG tablet, Take 1 tablet (500 mg total) by mouth every 8 (eight) hours as needed for  muscle spasms., Disp: 90 tablet, Rfl: 3  EXAM:  VITALS per patient if applicable: Ht '5\' 6"'$  (1.676 m)   Wt 155 lb (70.3 kg)   BMI 25.02 kg/m   GENERAL: alert, oriented, appears well and in no acute distress  HEENT: atraumatic, conjunttiva clear, no obvious abnormalities on inspection of external nose and ears  NECK: normal movements of the head and neck  LUNGS: on inspection no signs of respiratory distress, breathing rate appears normal, no obvious gross SOB, gasping or wheezing  CV: no obvious cyanosis  MS: moves all visible extremities without noticeable abnormality  PSYCH/NEURO: pleasant and cooperative, no obvious depression or anxiety, speech and thought processing grossly intact  ASSESSMENT AND PLAN:  Discussed the following assessment and plan:  Neck pain, chronic He recently completed PT again but has had continued pain.  He will continue celebrex, gabapentin and robaxin as needed.  Prescriptions refilled.  He will let me know if having new or worsening symptoms.        I discussed the assessment and treatment plan with the patient. The patient was provided an opportunity to ask questions and all were answered. The patient agreed with the plan and demonstrated an understanding of the instructions.   The patient was advised to call back or seek an in-person evaluation if the symptoms worsen or if the condition fails to improve as anticipated.    Luetta Nutting, DO

## 2022-02-01 NOTE — Assessment & Plan Note (Signed)
He recently completed PT again but has had continued pain.  He will continue celebrex, gabapentin and robaxin as needed.  Prescriptions refilled.  He will let me know if having new or worsening symptoms.

## 2022-02-27 ENCOUNTER — Emergency Department (INDEPENDENT_AMBULATORY_CARE_PROVIDER_SITE_OTHER)
Admission: EM | Admit: 2022-02-27 | Discharge: 2022-02-27 | Disposition: A | Discharge status: Home / Self Care | Payer: No Typology Code available for payment source | Source: Home / Self Care | Attending: Family Medicine | Admitting: Family Medicine

## 2022-02-27 ENCOUNTER — Encounter: Payer: Self-pay | Admitting: Emergency Medicine

## 2022-02-27 DIAGNOSIS — M7918 Myalgia, other site: Secondary | ICD-10-CM | POA: Diagnosis not present

## 2022-02-27 DIAGNOSIS — S161XXA Strain of muscle, fascia and tendon at neck level, initial encounter: Secondary | ICD-10-CM

## 2022-02-27 NOTE — Discharge Instructions (Signed)
Use ice or heat to painful muscles Limit heavy activity while the back is sore Take your anti-inflammatory muscle relaxers as needed See your doctor if not improving by next week
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
## 2022-03-07 ENCOUNTER — Telehealth: Payer: Self-pay

## 2022-03-07 NOTE — Telephone Encounter (Signed)
Pt lvm stating he would like to use Voltaren but the pharmacy stated they would be contraindicated based on his current meds.   Please advise. Thanks

## 2022-03-08 NOTE — Telephone Encounter (Signed)
Attempted to contact the patient. No answer. VM full.

## 2022-03-09 NOTE — Telephone Encounter (Signed)
2nd attempt made. No answer. VM full

## 2022-03-10 NOTE — Telephone Encounter (Signed)
3rd attempt made. No answer. VM full.  Call closed

## 2023-05-31 ENCOUNTER — Other Ambulatory Visit: Payer: Self-pay | Admitting: Family Medicine

## 2023-05-31 DIAGNOSIS — M5412 Radiculopathy, cervical region: Secondary | ICD-10-CM

## 2024-05-13 ENCOUNTER — Encounter: Payer: Self-pay | Admitting: Sports Medicine
